# Patient Record
Sex: Male | Born: 1990 | Race: White | Hispanic: No | Marital: Married | Smoking: Current every day smoker
Health system: Southern US, Community
[De-identification: ages and names within clinical notes are randomized; demographics above are authoritative.]

## PROBLEM LIST (undated history)

## (undated) DIAGNOSIS — I951 Orthostatic hypotension: Secondary | ICD-10-CM

## (undated) DIAGNOSIS — H9190 Unspecified hearing loss, unspecified ear: Secondary | ICD-10-CM

## (undated) DIAGNOSIS — K219 Gastro-esophageal reflux disease without esophagitis: Secondary | ICD-10-CM

## (undated) DIAGNOSIS — R51 Headache: Principal | ICD-10-CM

## (undated) DIAGNOSIS — F411 Generalized anxiety disorder: Secondary | ICD-10-CM

## (undated) DIAGNOSIS — F1021 Alcohol dependence, in remission: Secondary | ICD-10-CM

## (undated) DIAGNOSIS — K6389 Other specified diseases of intestine: Secondary | ICD-10-CM

## (undated) DIAGNOSIS — R42 Dizziness and giddiness: Secondary | ICD-10-CM

## (undated) DIAGNOSIS — R519 Headache, unspecified: Secondary | ICD-10-CM

## (undated) DIAGNOSIS — J45909 Unspecified asthma, uncomplicated: Secondary | ICD-10-CM

## (undated) DIAGNOSIS — K589 Irritable bowel syndrome without diarrhea: Secondary | ICD-10-CM

## (undated) DIAGNOSIS — M79642 Pain in left hand: Secondary | ICD-10-CM

## (undated) DIAGNOSIS — M1990 Unspecified osteoarthritis, unspecified site: Secondary | ICD-10-CM

## (undated) DIAGNOSIS — T7840XA Allergy, unspecified, initial encounter: Secondary | ICD-10-CM

## (undated) DIAGNOSIS — R000 Tachycardia, unspecified: Secondary | ICD-10-CM

## (undated) DIAGNOSIS — G2581 Restless legs syndrome: Secondary | ICD-10-CM

## (undated) DIAGNOSIS — J302 Other seasonal allergic rhinitis: Secondary | ICD-10-CM

## (undated) DIAGNOSIS — M797 Fibromyalgia: Secondary | ICD-10-CM

## (undated) HISTORY — DX: Headache: R51

## (undated) HISTORY — DX: Gastro-esophageal reflux disease without esophagitis: K21.9

## (undated) HISTORY — DX: Pain in left hand: M79.642

## (undated) HISTORY — DX: Other specified diseases of intestine: K63.89

## (undated) HISTORY — PX: COLONOSCOPY: SHX174

## (undated) HISTORY — PX: INGUINAL HERNIA REPAIR: SUR1180

## (undated) HISTORY — DX: Alcohol dependence, in remission: F10.21

## (undated) HISTORY — PX: SINUS ENDO W/FUSION: SHX777

## (undated) HISTORY — DX: Irritable bowel syndrome, unspecified: K58.9

## (undated) HISTORY — DX: Tachycardia, unspecified: R00.0

## (undated) HISTORY — DX: Orthostatic hypotension: I95.1

## (undated) HISTORY — PX: ESOPHAGOGASTRODUODENOSCOPY: SHX1529

## (undated) HISTORY — DX: Headache, unspecified: R51.9

## (undated) HISTORY — DX: Unspecified osteoarthritis, unspecified site: M19.90

## (undated) HISTORY — DX: Allergy, unspecified, initial encounter: T78.40XA

## (undated) HISTORY — DX: Fibromyalgia: M79.7

## (undated) HISTORY — DX: Unspecified hearing loss, unspecified ear: H91.90

## (undated) HISTORY — DX: Generalized anxiety disorder: F41.1

## (undated) HISTORY — DX: Unspecified asthma, uncomplicated: J45.909

## (undated) HISTORY — DX: Restless legs syndrome: G25.81

## (undated) HISTORY — DX: Other seasonal allergic rhinitis: J30.2

## (undated) HISTORY — PX: WISDOM TOOTH EXTRACTION: SHX21

## (undated) HISTORY — PX: CARDIOVASCULAR STRESS TEST: SHX262

## (undated) HISTORY — DX: Dizziness and giddiness: R42

---
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
## 2008-10-11 ENCOUNTER — Encounter: Admission: RE | Admit: 2008-10-11 | Discharge: 2008-10-11 | Payer: Self-pay | Admitting: Family Medicine

## 2010-08-21 NOTE — Op Note (Signed)
[place]  Patient:    Dustin [name], Dustin [name] Visit Number: [id number] MRN: [id number]          Service Type: DSU Location: [facility] Attending Physician:  Carlos Levering Dictated by:   Hyman Bible Pendse, M.D. Proc. Date: 11/25/00 Adm. Date:  11/25/2000 Disc. Date: 11/25/2000   CC:         Juan Quam, M.D.   Operative Report  PREOPERATIVE DIAGNOSIS:  Bilateral inguinal hernia.  POSTOPERATIVE DIAGNOSIS:  Bilateral inguinal hernia.  OPERATION PERFORMED:  Repair of bilateral inguinal hernia.  SURGEON:  Prabhakar D. Levie Heritage, M.D.  ASSISTANT:  Nurse.  ANESTHESIA:  Nurse.  OPERATIVE PROCEDURE:  Under satisfactory general anesthesia with the patient in the supine position, the abdomen and groin regions were thoroughly prepped and draped in the usual manner.  A 2.5 cm long transverse incision was made in the right groin and distal skin crease.  The skin and subcutaneous tissue were incised.  The bleeders were individually clamped, cut, and electrocoagulated. The external oblique was opened. The ______ were dissected to isolate the indirect inguinal hernia sac.  This sac was isolated up to its half point, doubly suture ligated with 3-0 silk.  The excess of the sac was excised.  The testicle was returned to the right scrotal pouch.  Hernia repair was carried out by a modified Fergusons method with #35 wire interrupted sutures.  0.25% Marcaine with epinephrine was injected locally for postop analgesia.  The subcutaneous tissue was closed with 4-0 Vicryl.  The skin was closed with 4-0 Monocryl subcuticular sutures.  The patients general condition was satisfactory.  Exploration of the left groin was carried out.  The findings were consistent with a left indirect inguinal hernia.  Repair was carried out in a similar fashion.  Both incisions were dressed with Steri-Strips. Throughout the procedure, the patients vital signs remained stable.   The patient withstood the procedure well and was transferred to the recovery room in satisfactory general condition. Dictated by:   Hyman Bible Pendse, M.D. Attending Physician:  Carlos Levering DD:  11/25/00 TD:  11/27/00 Job: [id number]

## 2010-08-21 NOTE — H&P (Signed)
NAMEEWEL, LONA               ACCOUNT NO.:  [id number]   MEDICAL RECORD NO.:  [id number]          PATIENT TYPE:  OBV   LOCATION:  1517                         FACILITY:  [facility]   PHYSICIAN:  Theone Stanley, MD   DATE OF BIRTH:  1991/02/01   DATE OF ADMISSION:  02/20/2006  DATE OF DISCHARGE:                              HISTORY & PHYSICAL   CHIEF COMPLAINT:  Painful joints.   HISTORY OF PRESENT ILLNESS:  Mr. Deboer is a very pleasant 20 year old  gentleman who presents to the ER the second time this week.  His issue  started on Thursday where he started to have itchy back and hip.  Later  in the afternoon, developed urticaria along the hip, axillary and the  back of the head.  Brought to the ER on 11/16 and was told it was viral  in nature.  At that time, he did have one swollen finger but this went  away.  Yesterday morning is when he had swelling and painful joints in  the hand bilaterally, ankles bilaterally, knees bilaterally, elbows  bilaterally and toes.  The patient also noted he had a headache last  week lasting from Monday until Wednesday.  He normally does have  intermittent headaches however this one was unusual in the fact that it  was persistent despite taking Tylenol and other over-the-counter  medications.  It went it away on its own.  However, yesterday he started  to have another headache in the same nature, took ibuprofen and again it  did not go away.  The headache he described it along his forehead area.  It was constant and pressure type feeling.   PAST MEDICAL HISTORY:  None.   MEDICATIONS:  The patient was on tetracycline for acne.  He was taking  it for 2 weeks and then stopped on the day he started to have the  itching.   SURGERIES:  Bilateral inguinal hernia repair.   FAMILY HISTORY:  Significant for rheumatoid arthritis in the paternal  grandmother and aunt.  His father unfortunately passed away at the age  of 72 from esophageal cancer.   Paternal uncle died of lung cancer.  A  paternal grandfather died of lung cancer.  A maternal grandfather had  diabetes.   SOCIAL HISTORY:  The patient lives in [place] with his mom who is  widowed.  He does have a girlfriend but denies any sexual activity.  No  tobacco, alcohol or illicit drug use.   REVIEW OF SYSTEMS:  Please see HPI.  Otherwise, all systems were  negative.   PHYSICAL EXAM:  GENERAL:  Mr. Sanor is a very pleasant 20 year old  gentleman lying in the gurney in no acute distress.  VITAL SIGNS:  Temperature of 97.5, respiratory rate of 20, heart rate of  69, blood pressure 124/66.  HEENT:  Head atraumatic, normocephalic.  Eyes 4 mm, equal, reactive.  Extraocular movements intact.  Eyes and nose:  No discharge.  Throat:  Clear.  No erythema appreciated.  NECK:  Supple.  I do not appreciate any lymphadenopathy.  HEART:  Regular rate and rhythm.  No murmur, rubs or gallops heard.  LUNGS:  Clear to auscultation bilaterally.  ABDOMEN:  Soft, nontender, nondistended.  EXTREMITIES:  No edema, cyanosis or clubbing.  It was evident that he  had swelling in his hands and his knees.  He stated that his ankles and  elbows were also swollen.  On examination, it was also painful to touch.  The patient was unable to fully extend or flex his hands.  He also had  pain along the tendons but I do not appreciate any swelling along there.  SKIN:  I do not appreciate any rashes, urticaria or jaundice.  GU:  Deferred.  PSYCH:  The patient had appropriate speech and affect.   LABS/RADIOLOGY:  Rapid strep screen was negative.  ESR was 2.  Monospot  was negative.  BMP glucose is slightly elevated at 128.  CBC showed a  white count of 10, hemoglobin of 13, hematocrit of 40, MCV of 94,  platelet 282, neutrophils 87, lymphocytes 10, monocytes 3, no  eosinophils or basophils.   ASSESSMENT AND PLAN:  Polyarthritis.  This could be viral, bacterial or  serum sickness from his medication.  I  suspect this is viral in nature,  however, because of the constellation of his presentation, I will  consult Dr. Maurice March of infectious disease.  He recommended obtaining an ASO  titer.  This has been written for.  Other things to consider would be  Epstein-Barr virus, cytomegalovirus.  Although the patient does deny  sexual activity, need to consider gonorrhea and chlamydia.  If this is  firm, tetracycline would not give the patient tetracycline again.  At  this point in time, will also provide symptomatic relief with  nonsteroidal anti-inflammatory drugs, Toradol, Benadryl and Tylenol as  needed.      Theone Stanley, MD  Electronically Signed     AEJ/MEDQ  D:  02/20/2006  T:  02/20/2006  Job:  [id number]   cc:   Dr. Tiburcio Pea

## 2010-08-21 NOTE — Discharge Summary (Signed)
Dustin [name], Dustin [name]               ACCOUNT NO.:  [id number]   MEDICAL RECORD NO.:  [id number]          PATIENT TYPE:  OBV   LOCATION:  1517                         FACILITY:  [facility]   PHYSICIAN:  Theone Stanley, MD   DATE OF BIRTH:  22-Jul-1990   DATE OF ADMISSION:  02/20/2006  DATE OF DISCHARGE:  02/21/2006                               DISCHARGE SUMMARY   ADMISSION DIAGNOSIS:  Polyarthritis.   DISCHARGE DIAGNOSIS:  Polyarthritis, most likely viral in nature.   CONSULTATIONS:  Infectious disease.   PROCEDURES/DIAGNOSTIC TESTS:  Laboratory work:  Patient had an ASO titer  which was less than 25.  RPR, which was negative.  Rapid strep, which is  negative.  RA, which was less than 20.  ESR, which was 2.  Mono spot,  which was negative.  A BNP shows a sodium of 141, potassium 4.4,  chloride 108, CO2 27, BUN 12, creatinine 0.27.  A CBC showed a white  count of 10, hemoglobin 13, hematocrit 40.   HOSPITAL COURSE:  Dustin [name] is a very pleasant 20 year old gentleman  who presents to the hospital for the second time for the same complaints  of polyarthritis.  He originally had problems starting on Thursday of  last week, where he had pain in the back and hip, and later in the  afternoon he had some urticaria along the hip, axillary, and the back of  his head.  He did take some Benadryl, which resolved the urticaria;  however, he did have swelling of one finger.  He was brought to the ER  at that time.  He was told it was viral in nature; however, yesterday,  on the 18th, he subsequently had swelling and pain in bilateral hands,  knees, ankles, and toes.  Because of this, his mother brought him to the  hospital, concerned.  He also complained of some headache.  Because of  the nature of his symptoms, he was brought in for symptom relief and  further workup.   Laboratory was obtained, please see above.   Infectious disease was kind enough to see the patient.   The following tests are  pending at this point in time:  Acute hepatitis  A, B, C panel, EBV cytomegaly virus, Parvovirus 19B, ECR for Parvovirus  19B.   Patient was started on Toradol.  His pain and inflammation improved  dramatically.  Since his symptoms improved, it was felt, with the okay  of ID, to discharge him with Motrin 800 mg t.i.d.  Will need to follow  up with Dr. Tiburcio Pea to  see what laboratory results show.  Patient was discharged in stable  condition to home.  Follow up with Dr. Tiburcio Pea in 5-7 days.   DISCHARGE MEDICATIONS:  Motrin 800 mg 1 p.o. t.i.d. for 7-10 days.      Theone Stanley, MD  Electronically Signed     AEJ/MEDQ  D:  02/21/2006  T:  02/21/2006  Job:  [id number]   cc:   Fransisco Hertz, M.D.  Fax: [phone]

## 2013-05-23 ENCOUNTER — Other Ambulatory Visit: Payer: Self-pay | Admitting: Allergy and Immunology

## 2013-05-23 ENCOUNTER — Ambulatory Visit
Admission: RE | Admit: 2013-05-23 | Discharge: 2013-05-23 | Disposition: A | Discharge status: Home / Self Care | Source: Ambulatory Visit | Attending: Allergy and Immunology | Admitting: Allergy and Immunology

## 2013-05-23 DIAGNOSIS — R05 Cough: Secondary | ICD-10-CM

## 2013-05-23 DIAGNOSIS — R059 Cough, unspecified: Secondary | ICD-10-CM

## 2014-01-31 ENCOUNTER — Other Ambulatory Visit: Payer: Self-pay | Admitting: Family Medicine

## 2014-01-31 DIAGNOSIS — R42 Dizziness and giddiness: Secondary | ICD-10-CM

## 2014-02-05 ENCOUNTER — Ambulatory Visit
Admission: RE | Admit: 2014-02-05 | Discharge: 2014-02-05 | Disposition: A | Discharge status: Home / Self Care | Source: Ambulatory Visit | Attending: Family Medicine | Admitting: Family Medicine

## 2014-02-05 DIAGNOSIS — R42 Dizziness and giddiness: Secondary | ICD-10-CM

## 2014-02-05 MED ORDER — GADOBENATE DIMEGLUMINE 529 MG/ML IV SOLN: 16.0000 mL | Freq: Once | INTRAVENOUS | Status: AC | PRN

## 2014-03-18 ENCOUNTER — Ambulatory Visit: Admitting: Neurology

## 2014-03-18 ENCOUNTER — Telehealth: Payer: Self-pay | Admitting: Neurology

## 2014-03-18 NOTE — Telephone Encounter (Signed)
Pt came to the office at 2:15PM thinking that his appt was at 2:30PM. I informed the pt that his appt was at 9:30AM instead. Pt will call later to r/s the appt. Dr. Reade/ referring provider was notified.

## 2014-03-19 NOTE — Telephone Encounter (Signed)
See previous documentation, appt marked as a no show but a no show letter not sent. We will await a CB from the pt to r/s / Sherri S.

## 2014-05-14 ENCOUNTER — Encounter: Payer: Self-pay | Admitting: Neurology

## 2014-05-14 ENCOUNTER — Ambulatory Visit (INDEPENDENT_AMBULATORY_CARE_PROVIDER_SITE_OTHER): Admitting: Neurology

## 2014-05-14 VITALS — BP 140/60 | HR 62 | Temp 97.6°F | Resp 16 | Ht 71.0 in | Wt 178.5 lb

## 2014-05-14 DIAGNOSIS — R42 Dizziness and giddiness: Secondary | ICD-10-CM

## 2014-05-14 DIAGNOSIS — H8190 Unspecified disorder of vestibular function, unspecified ear: Secondary | ICD-10-CM

## 2014-05-14 DIAGNOSIS — H8120 Vestibular neuronitis, unspecified ear: Secondary | ICD-10-CM

## 2014-05-14 MED ORDER — TOPIRAMATE 25 MG PO TABS: 25.0000 mg | ORAL_TABLET | Freq: Every day | ORAL | Status: DC

## 2014-05-14 NOTE — Progress Notes (Signed)
NEUROLOGY CONSULTATION NOTE  EHAB HUMBER MRN: [id number] DOB: [birth year]  Referring provider: Dr. Alyson Ingles Primary care provider: Dr. Moreen Fowler  Reason for consult:  vertigo  HISTORY OF PRESENT ILLNESS: Dustin [name] is a 24 year old right-handed man with asthma who presents for dizziness.  Records and MRI of the brain reviewed.  He is accompanied by his girlfriend who provides some history.  He began experiencing dizziness following sinus surgery in September.  He describes the dizziness as a spinning sensation to the right.  It occurs spontaneously and not triggered by any movement or position.  It can occur while driving.  It lasts anywhere from 5 minutes but has lasted constantly for 2 days.  It usually occurs several times a day.  It is not associated with headache, nausea, dizziness or double vision.  On one occasion, it occurred while he was running and he felt like he was going to pass out.  Sometimes, he notes a pop in his ear, followed by ringing which can last a few seconds.  He denies hearing loss, however.  He tried meclizine, which did not help.  He had an MRI of the brain with and without contrast was performed on 02/05/14, which was unremarkable.  He also saw ENT.  Audiometric testing revealed only mild sensorineural hearing loss.  Vestibular migraine was suspected.  He reports that he has occasional headaches, described as bi-frontal and throbbing.  They last 45 minutes and occur 5 or 6 times a year.  It may be associated with phonophobia but not nausea, photophobia or visual disturbance.  PAST MEDICAL HISTORY: Past Medical History  Diagnosis Date  . Hearing loss     PAST SURGICAL HISTORY: Past Surgical History  Procedure Laterality Date  . Sinus endo w/fusion      MEDICATIONS: No current outpatient prescriptions on file prior to visit.   No current facility-administered medications on file prior to visit.    ALLERGIES: Allergies  Allergen Reactions  .  Penicillins Hives  . Tetracyclines & Related Hives    FAMILY HISTORY: Family History  Problem Relation Age of Onset  . Cancer Father     throat   . Cancer Paternal Grandfather     SOCIAL HISTORY: History   Social History  . Marital Status: Married    Spouse Name: N/A    Number of Children: N/A  . Years of Education: N/A   Occupational History  . Not on file.   Social History Main Topics  . Smoking status: Former Research scientist (life sciences)  . Smokeless tobacco: Never Used  . Alcohol Use: 0.0 oz/week    0 Not specified per week  . Drug Use: No  . Sexual Activity:    Partners: Female   Other Topics Concern  . Not on file   Social History Narrative  . No narrative on file    REVIEW OF SYSTEMS: Constitutional: No fevers, chills, or sweats, no generalized fatigue, change in appetite Eyes: No visual changes, double vision, eye pain Ear, nose and throat: No hearing loss, ear pain, nasal congestion, sore throat Cardiovascular: No chest pain, palpitations Respiratory:  No shortness of breath at rest or with exertion, wheezes GastrointestinaI: No nausea, vomiting, diarrhea, abdominal pain, fecal incontinence Genitourinary:  No dysuria, urinary retention or frequency Musculoskeletal:  No neck pain, back pain Integumentary: No rash, pruritus, skin lesions Neurological: as above Psychiatric: No depression, insomnia, anxiety Endocrine: No palpitations, fatigue, diaphoresis, mood swings, change in appetite, change in weight, increased thirst Hematologic/Lymphatic:  No  anemia, purpura, petechiae. Allergic/Immunologic: no itchy/runny eyes, nasal congestion, recent allergic reactions, rashes  PHYSICAL EXAM: Filed Vitals:   05/14/14 1106  BP: 140/60  Pulse: 62  Temp: 97.6 F (36.4 C)  Resp: 16   General: No acute distress Head:  Normocephalic/atraumatic Eyes:  fundi unremarkable, without vessel changes, exudates, hemorrhages or papilledema. Neck: supple, no paraspinal tenderness, full  range of motion Back: No paraspinal tenderness Heart: regular rate and rhythm Lungs: Clear to auscultation bilaterally. Vascular: No carotid bruits. Neurological Exam: Mental status: alert and oriented to person, place, and time, recent and remote memory intact, fund of knowledge intact, attention and concentration intact, speech fluent and not dysarthric, language intact. Cranial nerves: CN I: not tested CN II: pupils equal, round and reactive to light, visual fields intact, fundi unremarkable, without vessel changes, exudates, hemorrhages or papilledema. CN III, IV, VI:  full range of motion, no nystagmus, no ptosis CN V: facial sensation intact CN VII: upper and lower face symmetric CN VIII: hearing intact CN IX, X: gag intact, uvula midline CN XI: sternocleidomastoid and trapezius muscles intact CN XII: tongue midline Bulk & Tone: normal, no fasciculations. Motor:  5/5 throughout Sensation:  Temperature and vibration intact Deep Tendon Reflexes:  2+ throughout, toes downgoing Finger to nose testing:  No dysmetria Heel to shin:  No dysmetria Gait:  Normal station and stride.  Able to turn and walk in tandem. Romberg negative.  IMPRESSION: Recurrent episodic vertigo.  Etiology is unclear, but vestibular migraine cannot be completely ruled out.  PLAN: 1.  I would treat for possible migraine.  We discussed the various preventative medications and he has decided on topamax.  He will start 25mg  at bedtime and will call in 4 weeks with update.   2.  Unfortunately, in the absent of headache, typical abortive migraine medications are not indicated.   3.  Follow up in 3 months  Thank you for allowing me to take part in the care of this patient.  Metta Clines, DO  CC:  Maury Dus, MD  Antony Contras, MD

## 2014-05-14 NOTE — Patient Instructions (Signed)
I can't say for sure that the vertigo is migraine-related, however it cannot be ruled out.  Therefore, I would treat it as migraine and see how you do. 1.  We will start topiramate (Topamax) 25mg  at bedtime.  Possible side effects include: impaired thinking, sedation, paresthesias (numbness and tingling) and weight loss.  It may cause dehydration and there is a small risk for kidney stones, so make sure to stay hydrated with water during the day.  There is also a very small risk for glaucoma, so if you notice any change in your vision while taking this medication, see an ophthalmologist.    2.  Keep a diary and call in 4 weeks with update.  We can adjust dose or medication if needed. 3.  Follow up in 3 months.

## 2014-08-12 ENCOUNTER — Ambulatory Visit: Admitting: Neurology

## 2014-08-12 ENCOUNTER — Ambulatory Visit (INDEPENDENT_AMBULATORY_CARE_PROVIDER_SITE_OTHER): Admitting: Neurology

## 2014-08-12 ENCOUNTER — Encounter: Payer: Self-pay | Admitting: Neurology

## 2014-08-12 VITALS — BP 110/80 | HR 74 | Resp 20 | Ht 71.0 in | Wt 171.2 lb

## 2014-08-12 DIAGNOSIS — H8121 Vestibular neuronitis, right ear: Secondary | ICD-10-CM | POA: Diagnosis not present

## 2014-08-12 DIAGNOSIS — R42 Dizziness and giddiness: Secondary | ICD-10-CM | POA: Insufficient documentation

## 2014-08-12 DIAGNOSIS — H8190 Unspecified disorder of vestibular function, unspecified ear: Secondary | ICD-10-CM | POA: Insufficient documentation

## 2014-08-12 MED ORDER — TOPIRAMATE 50 MG PO TABS: 50.0000 mg | ORAL_TABLET | Freq: Every day | ORAL | Status: DC

## 2014-08-12 NOTE — Progress Notes (Signed)
NEUROLOGY FOLLOW UP OFFICE NOTE  Dustin [name] [id number]  HISTORY OF PRESENT ILLNESS: Dustin [name] is a 24 year old right-handed man with asthma who follows up for recurrent episodic vertigo.  UPDATE: He was started on topiramate 25mg  daily.  He has not really noticed much difference.  He is in the [employer] and it has effected is PT, as exertion triggers or exacerbates symptoms.  HISTORY: He began experiencing dizziness following sinus surgery in September.  He describes the dizziness as a spinning sensation to the right.  It occurs spontaneously and not triggered by any movement or position.  It can occur while driving.  It lasts anywhere from 5 minutes but has lasted constantly for 2 days.  It usually occurs several times a day.  It is not associated with headache, nausea, dizziness or double vision.  On one occasion, it occurred while he was running and he felt like he was going to pass out.  Sometimes, he notes a pop in his ear, followed by ringing which can last a few seconds. He denies hearing loss, however.  He tried meclizine, which did not help.  He had an MRI of the brain with and without contrast was performed on 02/05/14, which was unremarkable.  He also saw ENT.  Audiometric testing revealed only mild sensorineural hearing loss.  Vestibular migraine was suspected.  He reports that he has occasional headaches, described as bi-frontal and throbbing.  They last 45 minutes and occur 5 or 6 times a year.  It may be associated with phonophobia but not nausea, photophobia or visual disturbance.  PAST MEDICAL HISTORY: Past Medical History  Diagnosis Date  . Hearing loss   . Headache     MEDICATIONS: Current Outpatient Prescriptions on File Prior to Visit  Medication Sig Dispense Refill  . loratadine (CLARITIN) 10 MG tablet Take 10 mg by mouth daily.    Marland Kitchen topiramate (TOPAMAX) 25 MG tablet Take 1 tablet (25 mg total) by mouth at bedtime. 30 tablet 1   No current  facility-administered medications on file prior to visit.    ALLERGIES: Allergies  Allergen Reactions  . Penicillins Hives  . Tetracyclines & Related Hives    FAMILY HISTORY: Family History  Problem Relation Age of Onset  . Cancer Father     throat   . Cancer Paternal Grandfather     SOCIAL HISTORY: History   Social History  . Marital Status: Married    Spouse Name: N/A  . Number of Children: N/A  . Years of Education: N/A   Occupational History  . Not on file.   Social History Main Topics  . Smoking status: Former Research scientist (life sciences)  . Smokeless tobacco: Never Used  . Alcohol Use: 0.0 oz/week    0 Standard drinks or equivalent per week     Comment: social  . Drug Use: No  . Sexual Activity:    Partners: Female   Other Topics Concern  . Not on file   Social History Narrative    REVIEW OF SYSTEMS: Constitutional: No fevers, chills, or sweats, no generalized fatigue, change in appetite Eyes: No visual changes, double vision, eye pain Ear, nose and throat: No hearing loss, ear pain, nasal congestion, sore throat Cardiovascular: No chest pain, palpitations Respiratory:  No shortness of breath at rest or with exertion, wheezes GastrointestinaI: No nausea, vomiting, diarrhea, abdominal pain, fecal incontinence Genitourinary:  No dysuria, urinary retention or frequency Musculoskeletal:  No neck pain, back pain Integumentary: No rash, pruritus, skin lesions  Neurological: as above Psychiatric: No depression, insomnia, anxiety Endocrine: No palpitations, fatigue, diaphoresis, mood swings, change in appetite, change in weight, increased thirst Hematologic/Lymphatic:  No anemia, purpura, petechiae. Allergic/Immunologic: no itchy/runny eyes, nasal congestion, recent allergic reactions, rashes  PHYSICAL EXAM: Filed Vitals:   08/12/14 0940  BP: 110/80  Pulse: 74  Resp: 20   General: No acute distress Head:  Normocephalic/atraumatic Eyes:  Fundoscopic exam unremarkable  without vessel changes, exudates, hemorrhages or papilledema. Neck: supple, no paraspinal tenderness, full range of motion Heart:  Regular rate and rhythm Lungs:  Clear to auscultation bilaterally Back: No paraspinal tenderness Neurological Exam: alert and oriented to person, place, and time. Attention span and concentration intact, recent and remote memory intact, fund of knowledge intact.  Speech fluent and not dysarthric, language intact.  CN II-XII intact. Fundoscopic exam unremarkable without vessel changes, exudates, hemorrhages or papilledema.  Bulk and tone normal, muscle strength 5/5 throughout.  Sensation to light touch, temperature and vibration intact.  Deep tendon reflexes 2+ throughout, toes downgoing.  Finger to nose and heel to shin testing intact.  Gait normal  IMPRESSION: Recurrent episodic vertigo.  Etiology unknown.  Vestibular migraine possible but it is a diagnosis of exclusion since he has no history of migraine, no substantial migrainous symptoms associated with vertigo and he has only used a small dose of topiramate.  PLAN: Increase topiramate to 50mg  at bedtime.  HE WAS INSTRUCTED TO CALL IN 4 WEEKS WITH UPDATE AND WE CAN FURTHER INCREASE DOSE IF NEEDED. Will fill out form for the [employer] restricting activity such as running, sit-ups and push-ups Follow up in 3 months  15 minutes spent with patient, over 50% spent discussing diagnosis and plan.  Metta Clines, DO  CC:  Antony Contras, MD

## 2014-08-15 ENCOUNTER — Ambulatory Visit: Admitting: Neurology

## 2014-08-22 ENCOUNTER — Telehealth: Payer: Self-pay | Admitting: *Deleted

## 2014-08-22 NOTE — Telephone Encounter (Signed)
The numbness and tingling is likely related to the topamax.  I would not know of any other reason that he would be having numbness.  I would give it until Monday to see how he is.

## 2014-08-22 NOTE — Telephone Encounter (Signed)
Patient's wife called and stated patient is taking tompamax 50 mg HS  Saturday night he took it and when he woke up Sunday his hands felt numb and tingling she states he has not taken it since .but is still having the tingling Please advise

## 2014-08-22 NOTE — Telephone Encounter (Signed)
I spoke with patient spouse patient will stop Topamax until Monday and was advised to contact office then and let us know how he is doing .

## 2014-09-10 ENCOUNTER — Ambulatory Visit (INDEPENDENT_AMBULATORY_CARE_PROVIDER_SITE_OTHER): Admitting: Neurology

## 2014-09-10 ENCOUNTER — Encounter: Payer: Self-pay | Admitting: Neurology

## 2014-09-10 VITALS — BP 104/60 | HR 66 | Resp 20 | Ht 71.26 in | Wt 168.0 lb

## 2014-09-10 DIAGNOSIS — H8143 Vertigo of central origin, bilateral: Secondary | ICD-10-CM | POA: Diagnosis not present

## 2014-09-10 DIAGNOSIS — H814 Vertigo of central origin: Secondary | ICD-10-CM | POA: Insufficient documentation

## 2014-09-10 DIAGNOSIS — R42 Dizziness and giddiness: Secondary | ICD-10-CM | POA: Diagnosis not present

## 2014-09-10 DIAGNOSIS — IMO0001 Reserved for inherently not codable concepts without codable children: Secondary | ICD-10-CM

## 2014-09-10 MED ORDER — DIAZEPAM 2 MG PO TABS: ORAL_TABLET | ORAL | Status: DC

## 2014-09-10 NOTE — Progress Notes (Signed)
Provider:  Larey Seat, M D  Referring Provider: Antony Contras, MD Primary Care Physician:  Gara Kroner, MD  Chief Complaint  Patient presents with  . Dizziness    rm 11, new patient, with wife    HPI:  Dustin [name] is a 24 y.o. male, seen here as a referral from Dr. Moreen Fowler for vertigo evaluation.   Dustin [name] is a 24 year old Caucasian tended male who presents for an evaluation of vertigo.  His wife are here together today reporting that Dustin [name] underwent a sinus surgery in September 2015 within days after the sinus surgery his sinus problems resolved but he begun experiencing vertigo. An MRI was obtained the months following to surgery which showed no acute brain abnormalities it was done with and without contrast. There was still some sinus mucosal thickening noted. He also has a history of asthma and is taking antiallergy medicines and nasal spray. He describes his dizziness as a spinning sensation to the right felt that his surrounding spun around him and not that he was in motion spells occurred without any noticeable trigger. At times he would just watch TV or drive and was not aware of a rapid head movement or positional change bring it on. Some spells lasted a couple of minutes for other more severe one lasted a couple of hours. He usually has a spell once or several times a day he reports that he has never been woken out of sleep by a vertigo spell or that's his sleep is affected in any way by this. When he wakes up in the morning he does not wake up first feeling dizzy. He has some headaches but these seem to be unrelated to the vertigo spells. He did have some nausea with some of the vertigo spells but no diplopia he feels dizzy and lightheaded at times almost as if passing out but has never so far lost awareness or consciousness. 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  He had tried meclizine and his primary care first which did not help he had seen Dr. Loretta Plume, D.O. Who  prescribed topiramate concerning that this may be a migraine equivalent. This did not work.   Again the MRI was unremarkable in November 2015 ENT testing showed hearing loss and the possibility of a retrocochlear origin of vertigo needs to be evaluated.  Review of Systems: Out of a complete 14 system review, the patient complains of only the following symptoms, and all other reviewed systems are negative. See above .  Tried topiramate for several months but stated that he had tingling around fingers and lips an expected side effect. However since it didn't change his nausea pattern or his vertigo pattern he discontinued the medication with approval of his prescribing physician. He has Restless legs, his mother has this , too.  History   Social History  . Marital Status: Married    Spouse Name: N/A  . Number of Children: N/A  . Years of Education: N/A   Occupational History  . Not on file.   Social History Main Topics  . Smoking status: Former Smoker    Quit date: 04/05/2010  . Smokeless tobacco: Never Used  . Alcohol Use: 0.0 oz/week    0 Standard drinks or equivalent per week     Comment: social  . Drug Use: No  . Sexual Activity:  Partners: Female   Other Topics Concern  . Not on file   Social History Narrative   Denies caffeine consumption.    Family History  Problem Relation Age of Onset  . Cancer Father     throat   . Cancer Paternal Grandfather     Past Medical History  Diagnosis Date  . Hearing loss   . Headache   . Asthma   . Vertigo     Past Surgical History  Procedure Laterality Date  . Sinus endo w/fusion      Current Outpatient Prescriptions  Medication Sig Dispense Refill  . albuterol (PROVENTIL HFA;VENTOLIN HFA) 108 (90 BASE) MCG/ACT inhaler Inhale 1 puff into the lungs every 6 (six) hours as needed for wheezing or shortness of  breath.    . fluticasone (FLONASE) 50 MCG/ACT nasal spray Place 1 spray into both nostrils daily.    Marland Kitchen loratadine (CLARITIN) 10 MG tablet Take 10 mg by mouth daily.    . meclizine (ANTIVERT) 12.5 MG tablet     . PREDNISONE, PAK, PO Take by mouth.    . topiramate (TOPAMAX) 50 MG tablet Take 1 tablet (50 mg total) by mouth at bedtime. (Patient not taking: Reported on 09/10/2014) 30 tablet 2   No current facility-administered medications for this visit.    Allergies as of 09/10/2014 - Review Complete 09/10/2014  Allergen Reaction Noted  . Penicillins Hives 05/14/2014  . Tetracyclines & related Hives 05/14/2014  . Biaxin [clarithromycin]  09/10/2014  . Ceftin [cefuroxime axetil]  09/10/2014    Vitals: BP 104/60 mmHg  Pulse 66  Resp 20  Ht 5' 11.26" (1.81 m)  Wt 168 lb (76.204 kg)  BMI 23.26 kg/m2 Last Weight:  Wt Readings from Last 1 Encounters:  09/10/14 168 lb (76.204 kg)   Last Height:   Ht Readings from Last 1 Encounters:  09/10/14 5' 11.26" (1.81 m)    Physical exam:  General: The patient is awake, alert and appears not in acute distress. The patient is well groomed. Head: Normocephalic, atraumatic. Neck is supple. Mallampati 3, neck circumference: 14  Cardiovascular:  Regular rate and rhythm , without  murmurs or carotid bruit, and without distended neck veins. Respiratory: Lungs are clear to auscultation. Skin:  Without evidence of edema, or rash Trunk: BMI is normal .  Neurologic exam : The patient is awake and alert, oriented to place and time.  Memory subjective described as intact. There is a normal attention span & concentration ability. Speech is fluent without  dysarthria, dysphonia or aphasia. Mood and affect are appropriate.  Cranial nerves: Pupils are equal and briskly reactive to light. Funduscopic exam without  evidence of pallor or edema. Extraocular movements  in vertical and horizontal planes intact and without nystagmus.  Visual fields by finger  perimetry are intact. Hearing to finger rub intact.  Facial sensation intact to fine touch. Facial motor strength is symmetric and tongue and uvula move midline. Tongue protrusion into either cheek is normal. Shoulder shrug is normal.   Motor exam:  Normal tone ,muscle bulk and symmetric  strength in all extremities.  Sensory:  Fine touch, pinprick and vibration were tested in all extremities. Proprioception was normal.  Coordination: Rapid alternating movements in the fingers/hands were normal. Finger-to-nose maneuver  normal without evidence of ataxia, dysmetria or tremor.  Gait and station: Patient walks without assistive device and is able unassisted to climb up to the exam table. Strength within normal limits. Stance is stable and normal. Tandem gait is  unfragmented. Romberg testing is negative . His righting reflexes are intact.   Deep tendon reflexes: in the  upper and lower extremities are symmetric and intact. Babinski maneuver response is downgoing.   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 Exhaustion and exercise can be a trigger. 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   He had spells while exercising , playing basketball with his brother .  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    Plan:  Treatment plan and additional workup :  # 3 month of Valium  0.5 mg in AM, to be repeated as needed in daytime.  Re evaluation after 3 month- he will adjust to the new normal.  He'll continue with his anti-histamines, I will write a memorandum for primary care managers medical evaluation to the medical liaison officer for the [employer] reserve.      Asencion Partridge  Dohmeier MD 09/10/2014

## 2014-09-10 NOTE — Patient Instructions (Signed)
Vertigo Vertigo means you feel like you are moving when you are not. Vertigo can make you feel like things around you are moving when they are not. This problem often goes away on its own.  HOME CARE   Follow your doctor's instructions.  Avoid driving.  Avoid using heavy machinery.  Avoid doing any activity that could be dangerous if you have a vertigo attack.  Tell your doctor if a medicine seems to cause your vertigo. GET HELP RIGHT AWAY IF:   Your medicines do not help or make you feel worse.  You have trouble talking or walking.  You feel weak or have trouble using your arms, hands, or legs.  You have bad headaches.  You keep feeling sick to your stomach (nauseous) or throwing up (vomiting).  Your vision changes.  A family member notices changes in your behavior.  Your problems get worse. MAKE SURE YOU:  Understand these instructions.  Will watch your condition.  Will get help right away if you are not doing well or get worse. Document Released: 12/30/2007 Document Revised: 06/14/2011 Document Reviewed: 10/08/2010 ExitCare Patient Information 2015 [employer]. This information is not intended to replace advice given to you by your health care provider. Make sure you discuss any questions you have with your health care provider.  

## 2014-09-10 NOTE — Addendum Note (Signed)
Addended by: Larey Seat on: 09/10/2014 10:07 AM   Modules accepted: Orders

## 2014-09-12 ENCOUNTER — Telehealth: Payer: Self-pay

## 2014-09-12 NOTE — Telephone Encounter (Signed)
Patient called to inform of [place] appointment 09/17/2014 @1  at [facility].  Patient verbalized understanding.

## 2014-09-16 ENCOUNTER — Other Ambulatory Visit (HOSPITAL_COMMUNITY)

## 2014-09-17 ENCOUNTER — Other Ambulatory Visit (HOSPITAL_COMMUNITY)

## 2014-09-24 ENCOUNTER — Ambulatory Visit (HOSPITAL_COMMUNITY)
Admission: RE | Admit: 2014-09-24 | Discharge: 2014-09-24 | Disposition: A | Discharge status: Home / Self Care | Source: Ambulatory Visit | Attending: Neurology | Admitting: Neurology

## 2014-09-24 DIAGNOSIS — H8143 Vertigo of central origin, bilateral: Secondary | ICD-10-CM | POA: Diagnosis not present

## 2014-09-24 DIAGNOSIS — R42 Dizziness and giddiness: Secondary | ICD-10-CM | POA: Diagnosis present

## 2014-09-24 DIAGNOSIS — IMO0001 Reserved for inherently not codable concepts without codable children: Secondary | ICD-10-CM

## 2014-09-24 NOTE — Progress Notes (Signed)
VASCULAR LAB PRELIMINARY  PRELIMINARY  PRELIMINARY  PRELIMINARY  TCD bubble study completed.    Preliminary report:  No HITS noted.  Negative bubble study.  Cestone,Helene, RVT 09/24/2014, 2:36 PM

## 2014-09-30 ENCOUNTER — Telehealth: Payer: Self-pay

## 2014-09-30 NOTE — Telephone Encounter (Signed)
Called pt to tell him that his TCD was normal. Left message explaining this, per DPR. Asked pt to call me back if he had any questions.

## 2014-09-30 NOTE — Telephone Encounter (Signed)
-----   Message from Larey Seat, MD sent at 09/27/2014  1:51 PM EDT ----- Vertigo work up patient , normal TCD emboly study- no open PFO in the heart. Has the  Dynamic study been done with dr Lenell Antu [place] tech yet ?

## 2014-10-24 ENCOUNTER — Ambulatory Visit (INDEPENDENT_AMBULATORY_CARE_PROVIDER_SITE_OTHER): Admitting: Neurology

## 2014-10-24 ENCOUNTER — Encounter: Payer: Self-pay | Admitting: Neurology

## 2014-10-24 VITALS — BP 104/60 | HR 72 | Resp 20 | Ht 71.0 in | Wt 166.0 lb

## 2014-10-24 DIAGNOSIS — H81319 Aural vertigo, unspecified ear: Secondary | ICD-10-CM

## 2014-10-24 DIAGNOSIS — R42 Dizziness and giddiness: Secondary | ICD-10-CM

## 2014-10-24 MED ORDER — DIAZEPAM 2 MG PO TABS: ORAL_TABLET | ORAL | Status: DC

## 2014-10-24 NOTE — Progress Notes (Signed)
Provider:  Larey Seat, M D  Referring Provider: Antony Contras, MD Primary Care Physician:  Gara Kroner, MD  Chief Complaint  Patient presents with  . Follow-up    dizziness, rm 10, with wife    HPI:  Dustin [name] is a 24 y.o. male, seen here as a referral from Dr. Moreen Fowler for vertigo evaluation.   Dustin [name] is a 24 year old Caucasian tended male who presents for an evaluation of vertigo.  His wife are here together today reporting that Dustin [name] underwent a sinus surgery in September 2015 within days after the sinus surgery his sinus problems resolved but he begun experiencing vertigo. An MRI was obtained the months following to surgery which showed no acute brain abnormalities it was done with and without contrast. There was still some sinus mucosal thickening noted. He also has a history of asthma and is taking antiallergy medicines and nasal spray. He describes his dizziness as a spinning sensation to the right felt that his surrounding spun around him and not that he was in motion spells occurred without any noticeable trigger. At times he would just watch TV or drive and was not aware of a rapid head movement or positional change bring it on. Some spells lasted a couple of minutes for other more severe one lasted a couple of hours. He usually has a spell once or several times a day he reports that he has never been woken out of sleep by a vertigo spell or that's his sleep is affected in any way by this. When he wakes up in the morning he does not wake up first feeling dizzy. He has some headaches but these seem to be unrelated to the vertigo spells. He did have some nausea with some of the vertigo spells but no diplopia he feels dizzy and lightheaded at times almost as if passing out but has never so far lost awareness or consciousness. He was tested for hearing loss with a local audiology laboratory and was found to have indeed have some hearing loss probably occupational in  nature.  He had tried meclizine and his primary care first which did not help he had seen Dr. Loretta Plume, D.O. Who  prescribed topiramate concerning that this may be a migraine equivalent. This did not work.   Again the MRI was unremarkable in November 2015 ENT testing showed hearing loss and the possibility of a retrocochlear origin of vertigo needs to be evaluated.  Review of Systems: Out of a complete 14 system review, the patient complains of only the following symptoms, and all other reviewed systems are negative. See above .  Tried topiramate for several months but stated that he had tingling around fingers and lips an expected side effect. However since it didn't change his nausea pattern or his vertigo pattern he discontinued the medication with approval of his prescribing physician. He has Restless legs, his mother has this , too.  History   Social History  . Marital Status: Married    Spouse Name: N/A  . Number of Children: N/A  . Years of Education: N/A   Occupational History  . Not on file.   Social History Main Topics  . Smoking status: Former Smoker    Quit date: 04/05/2010  . Smokeless tobacco: Never Used  . Alcohol Use: 0.0 oz/week    0 Standard drinks or equivalent per week     Comment: social  . Drug Use: No  . Sexual Activity:  Partners: Female   Other Topics Concern  . Not on file   Social History Narrative   Denies caffeine consumption.    Family History  Problem Relation Age of Onset  . Cancer Father     throat   . Cancer Paternal Grandfather     Past Medical History  Diagnosis Date  . Hearing loss   . Headache   . Asthma   . Vertigo     Past Surgical History  Procedure Laterality Date  . Sinus endo w/fusion      Current Outpatient Prescriptions  Medication Sig Dispense Refill  . albuterol (PROVENTIL HFA;VENTOLIN HFA) 108 (90 BASE) MCG/ACT inhaler Inhale 1 puff into the lungs every 6 (six) hours as needed for wheezing or shortness of  breath.    . diazepam (VALIUM) 2 MG tablet Take 1/4 tab in AM and as needed in Pm. 30 tablet 0  . fluticasone (FLONASE) 50 MCG/ACT nasal spray Place 1 spray into both nostrils daily.    Marland Kitchen loratadine (CLARITIN) 10 MG tablet Take 10 mg by mouth daily.    . meclizine (ANTIVERT) 12.5 MG tablet     . PREDNISONE, PAK, PO Take by mouth.    . topiramate (TOPAMAX) 50 MG tablet Take 1 tablet (50 mg total) by mouth at bedtime. 30 tablet 2   No current facility-administered medications for this visit.    Allergies as of 10/24/2014 - Review Complete 10/24/2014  Allergen Reaction Noted  . Penicillins Hives 05/14/2014  . Tetracyclines & related Hives 05/14/2014  . Biaxin [clarithromycin]  09/10/2014  . Ceftin [cefuroxime axetil]  09/10/2014    Vitals: BP 104/60 mmHg  Pulse 72  Resp 20  Ht 5\' 11"  (1.803 m)  Wt 166 lb (75.297 kg)  BMI 23.16 kg/m2 Last Weight:  Wt Readings from Last 1 Encounters:  10/24/14 166 lb (75.297 kg)   Last Height:   Ht Readings from Last 1 Encounters:  10/24/14 5\' 11"  (1.803 m)    Physical exam:  General: The patient is awake, alert and appears not in acute distress. The patient is well groomed. Head: Normocephalic, atraumatic. Neck is supple. Mallampati 3, neck circumference: 14  Cardiovascular:  Regular rate and rhythm , without  murmurs or carotid bruit, and without distended neck veins. Respiratory: Lungs are clear to auscultation. Skin:  Without evidence of edema, or rash Trunk: BMI is normal .  Neurologic exam : The patient is awake and alert, oriented to place and time.  Memory subjective described as intact. There is a normal attention span & concentration ability. Speech is fluent without  dysarthria, but there is mild dysphonia. Mood and affect are appropriate.  Cranial nerves: Pupils are equal and briskly reactive to light. Funduscopic exam without  evidence of pallor or edema. Extraocular movements  in vertical and horizontal planes intact and  without nystagmus.  Visual fields by finger perimetry are intact. Hearing to finger rub intact.   Facial sensation intact to fine touch. Facial motor strength is symmetric and tongue and uvula move midline.  Tongue protrusion into either cheek is normal. Shoulder shrug is normal.   Motor exam:  Normal tone ,muscle bulk and symmetric  strength in all extremities.  Sensory:  Fine touch, pinprick and vibration were tested in all extremities. Proprioception was normal.  Coordination: Rapid alternating movements in the fingers/hands were normal. Finger-to-nose maneuver  normal without evidence of ataxia, dysmetria or tremor.  Gait and station: Patient walks without assistive device and is able unassisted to  climb up to the exam table. Strength within normal limits. Stance is stable and normal. Tandem gait is unfragmented. Romberg testing is negative . His righting reflexes are intact.   Deep tendon reflexes: in the  upper and lower extremities are symmetric and intact. Babinski maneuver response is downgoing.   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 Exhaustion and exercise can be a trigger. 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 He is improved much on Valium.   He had spells while exercising , playing basketball with his brother, but less frequent.  .  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   He has insomnia, even on Valium.    Plan:  Treatment plan and additional workup :  # 3 month of Valium  0.5 mg in AM, to be repeated as needed in daytime. Vertigo is significantly reduced, but headaches have been more noted. Migraine 5 times in 3 month.  Re evaluation  after another 6  month- he will adjust to the new normal. He is back to full time work.  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       Asencion Partridge Dohmeier MD 10/24/2014

## 2014-12-19 ENCOUNTER — Telehealth: Payer: Self-pay

## 2014-12-19 NOTE — Telephone Encounter (Signed)
Dr. Brett Fairy is out of office 9/19. Called pt to r/s appt. Left a message asking him to call back to r/s his appt. Please place pt in a 30 minute spot.

## 2014-12-23 ENCOUNTER — Ambulatory Visit: Admitting: Neurology

## 2014-12-25 ENCOUNTER — Telehealth: Payer: Self-pay | Admitting: Neurology

## 2014-12-25 ENCOUNTER — Ambulatory Visit (INDEPENDENT_AMBULATORY_CARE_PROVIDER_SITE_OTHER): Admitting: Adult Health

## 2014-12-25 ENCOUNTER — Encounter: Payer: Self-pay | Admitting: Adult Health

## 2014-12-25 VITALS — BP 105/65 | HR 66 | Ht 71.0 in | Wt 169.0 lb

## 2014-12-25 DIAGNOSIS — R42 Dizziness and giddiness: Secondary | ICD-10-CM

## 2014-12-25 DIAGNOSIS — R51 Headache: Secondary | ICD-10-CM | POA: Diagnosis not present

## 2014-12-25 DIAGNOSIS — R55 Syncope and collapse: Secondary | ICD-10-CM

## 2014-12-25 DIAGNOSIS — R519 Headache, unspecified: Secondary | ICD-10-CM

## 2014-12-25 MED ORDER — DULOXETINE HCL 30 MG PO CPEP: 30.0000 mg | ORAL_CAPSULE | Freq: Every day | ORAL | Status: DC

## 2014-12-25 NOTE — Telephone Encounter (Signed)
Patient is coming to see Jinny Blossom, NP today.

## 2014-12-25 NOTE — Telephone Encounter (Signed)
Mother called to get an earlier appt for pt. He was r/s from 9/19 to 9/28 and felt that it was to long to wait. Pt was seen in the hospital on Saturday after passing out and hitting his head. Mother states pt is unable to function and is have lots of headaches. I spoke with nurse and she will see about an earlier appt for the pt. Nurse will call the mother back letting her know.

## 2014-12-25 NOTE — Patient Instructions (Signed)
Try Cymbalta 30 mg daily  If your symptoms worsen or you develop new symptoms please let us know.    Duloxetine delayed-release capsules What is this medicine? DULOXETINE (doo LOX e teen) is used to treat depression, anxiety, and different types of chronic pain. This medicine may be used for other purposes; ask your health care provider or pharmacist if you have questions. COMMON BRAND NAME(S): Cymbalta What should I tell my health care provider before I take this medicine? They need to know if you have any of these conditions: -bipolar disorder or a family history of bipolar disorder -glaucoma -kidney disease -liver disease -suicidal thoughts or a previous suicide attempt -taken medicines called MAOIs like Carbex, Eldepryl, Marplan, Nardil, and Parnate within 14 days -an unusual reaction to duloxetine, other medicines, foods, dyes, or preservatives -pregnant or trying to get pregnant -breast-feeding How should I use this medicine? Take this medicine by mouth with a glass of water. Follow the directions on the prescription label. Do not cut, crush or chew this medicine. You can take this medicine with or without food. Take your medicine at regular intervals. Do not take your medicine more often than directed. Do not stop taking this medicine suddenly except upon the advice of your doctor. Stopping this medicine too quickly may cause serious side effects or your condition may worsen. A special MedGuide will be given to you by the pharmacist with each prescription and refill. Be sure to read this information carefully each time. Talk to your pediatrician regarding the use of this medicine in children. While this drug may be prescribed for children as young as 70 years of age for selected conditions, precautions do apply. Overdosage: If you think you have taken too much of this medicine contact a poison control center or emergency room at once. NOTE: This medicine is only for you. Do not share  this medicine with others. What if I miss a dose? If you miss a dose, take it as soon as you can. If it is almost time for your next dose, take only that dose. Do not take double or extra doses. What may interact with this medicine? Do not take this medicine with any of the following medications: -certain diet drugs like dexfenfluramine, fenfluramine -desvenlafaxine -linezolid -MAOIs like Azilect, Carbex, Eldepryl, Marplan, Nardil, and Parnate -methylene blue (intravenous) -milnacipran -thioridazine -venlafaxine This medicine may also interact with the following medications: -alcohol -aspirin and aspirin-like medicines -certain antibiotics like ciprofloxacin and enoxacin -certain medicines for blood pressure, heart disease, irregular heart beat -certain medicines for depression, anxiety, or psychotic disturbances -certain medicines for migraine headache like almotriptan, eletriptan, frovatriptan, naratriptan, rizatriptan, sumatriptan, zolmitriptan -certain medicines that treat or prevent blood clots like warfarin, enoxaparin, and dalteparin -cimetidine -fentanyl -lithium -NSAIDS, medicines for pain and inflammation, like ibuprofen or naproxen -phentermine -procarbazine -sibutramine -St. John's wort -theophylline -tramadol -tryptophan This list may not describe all possible interactions. Give your health care provider a list of all the medicines, herbs, non-prescription drugs, or dietary supplements you use. Also tell them if you smoke, drink alcohol, or use illegal drugs. Some items may interact with your medicine. What should I watch for while using this medicine? Tell your doctor if your symptoms do not get better or if they get worse. Visit your doctor or health care professional for regular checks on your progress. Because it may take several weeks to see the full effects of this medicine, it is important to continue your treatment as prescribed by your doctor. Patients and  their families should watch out for new or worsening thoughts of suicide or depression. Also watch out for sudden changes in feelings such as feeling anxious, agitated, panicky, irritable, hostile, aggressive, impulsive, severely restless, overly excited and hyperactive, or not being able to sleep. If this happens, especially at the beginning of treatment or after a change in dose, call your health care professional. Dennis Bast may get drowsy or dizzy. Do not drive, use machinery, or do anything that needs mental alertness until you know how this medicine affects you. Do not stand or sit up quickly, especially if you are an older patient. This reduces the risk of dizzy or fainting spells. Alcohol may interfere with the effect of this medicine. Avoid alcoholic drinks. This medicine can cause an increase in blood pressure. This medicine can also cause a sudden drop in your blood pressure, which may make you feel faint and increase the chance of a fall. These effects are most common when you first start the medicine or when the dose is increased, or during use of other medicines that can cause a sudden drop in blood pressure. Check with your doctor for instructions on monitoring your blood pressure while taking this medicine. Your mouth may get dry. Chewing sugarless gum or sucking hard candy, and drinking plenty of water may help. Contact your doctor if the problem does not go away or is severe. What side effects may I notice from receiving this medicine? Side effects that you should report to your doctor or health care professional as soon as possible: -allergic reactions like skin rash, itching or hives, swelling of the face, lips, or tongue -changes in blood pressure -confusion -dark urine -dizziness -fast talking and excited feelings or actions that are out of control -fast, irregular heartbeat -fever -general ill feeling or flu-like symptoms -hallucination, loss of contact with reality -light-colored  stools -loss of balance or coordination -redness, blistering, peeling or loosening of the skin, including inside the mouth -right upper belly pain -seizures -suicidal thoughts or other mood changes -trouble concentrating -trouble passing urine or change in the amount of urine -unusual bleeding or bruising -unusually weak or tired -yellowing of the eyes or skin Side effects that usually do not require medical attention (report to your doctor or health care professional if they continue or are bothersome): -blurred vision -change in appetite -change in sex drive or performance -headache -increased sweating -nausea This list may not describe all possible side effects. Call your doctor for medical advice about side effects. You may report side effects to FDA at 1-800-FDA-1088. Where should I keep my medicine? Keep out of the reach of children. Store at room temperature between 15 and 30 degrees C (59 and 86 degrees F). Throw away any unused medicine after the expiration date. NOTE: This sheet is a summary. It may not cover all possible information. If you have questions about this medicine, talk to your doctor, pharmacist, or health care provider.  2015, [employer]vier/Gold Standard. (2013-03-13 14:20:31)

## 2014-12-25 NOTE — Progress Notes (Addendum)
PATIENT: Dustin [name] DOB: 1990-06-05  REASON FOR VISIT: follow up- dizziness HISTORY FROM: patient  HISTORY OF PRESENT ILLNESS: Dustin [name] is a 24 year old male with a history of dizziness. He returns today for an evaluation. He states that recently he was standing in formation for [employer]. He states he began to have a headache and then dizziness pursued. The next thing he remembers is waking up on the ground. He states that he was told he passed out. He was taken to the hospital in [place]. He states they completed a CT of the head that was normal. These results are not available to me. When he passed out there was no seizure activity noted. Denies any shaking or convulsing in the extremities. Denies loss of bowels or bladder. Denies biting his tongue or gums. The patient states that he's been having a "migraine" daily since the event. Before that he was having a present one headache a week. The patient describes his headache as being "all over." He denies any photophobia and phonophobia. He states that he will occasionally have some nausea but denies vomiting. The patient has been on Valium but did not notice benefit. He states that he stopped this medication approximately 4 days ago. The patient also states that he cannot stand l loud noise. He states this occurs at all times not just with a headache. For example he does not like to hear a dog bark or baby cry. The patient's mother is with him today. She states that 20 years ago she was having the same symptoms. She was diagnosed with serotonin deficiency. She was started on Cymbalta and her symptoms resolved. The patient returns today for an evaluation.  HISTORY 10/24/14 [facility]): Dustin [name] is a [age]-year-old Caucasian tended male who presents for an evaluation of vertigo. His wife are here together today reporting that Dustin [name] underwent a sinus surgery in September 2015 within days after the sinus surgery his sinus problems  resolved but he begun experiencing vertigo. An MRI was obtained the months following to surgery which showed no acute brain abnormalities it was done with and without contrast. There was still some sinus mucosal thickening noted. He also has a history of asthma and is taking antiallergy medicines and nasal spray. He describes his dizziness as a spinning sensation to the right felt that his surrounding spun around him and not that he was in motion spells occurred without any noticeable trigger. At times he would just watch TV or drive and was not aware of a rapid head movement or positional change bring it on. Some spells lasted a couple of minutes for other more severe one lasted a couple of hours. He usually has a spell once or several times a day he reports that he has never been woken out of sleep by a vertigo spell or that's his sleep is affected in any way by this. When he wakes up in the morning he does not wake up first feeling dizzy. He has some headaches but these seem to be unrelated to the vertigo spells. He did have some nausea with some of the vertigo spells but no diplopia he feels dizzy and lightheaded at times almost as if passing out but has never so far lost awareness or consciousness. 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  He had tried meclizine and his primary care first which  did not help he had seen Dr. Loretta Plume, D.O. Who prescribed topiramate concerning that this may be a migraine equivalent. This did not work.  Again the MRI was unremarkable in November 2015 ENT testing showed hearing loss and the possibility of a retrocochlear origin of vertigo needs to be evaluated.  REVIEW OF SYSTEMS: Out of a complete 14 system review of symptoms, the patient complains only of the following symptoms, and all other reviewed systems are negative.  ALLERGIES: Allergies  Allergen Reactions   . Penicillins Hives  . Tetracyclines & Related Hives  . Biaxin [Clarithromycin]   . Ceftin [Cefuroxime Axetil]     HOME MEDICATIONS: Outpatient Prescriptions Prior to Visit  Medication Sig Dispense Refill  . albuterol (PROVENTIL HFA;VENTOLIN HFA) 108 (90 BASE) MCG/ACT inhaler Inhale 1 puff into the lungs every 6 (six) hours as needed for wheezing or shortness of breath.    . fluticasone (FLONASE) 50 MCG/ACT nasal spray Place 1 spray into both nostrils daily.    . diazepam (VALIUM) 2 MG tablet Take 1/4 tab in AM and as needed in Pm. 30 tablet 0  . loratadine (CLARITIN) 10 MG tablet Take 10 mg by mouth daily.    Marland Kitchen PREDNISONE, PAK, PO Take by mouth.     No facility-administered medications prior to visit.    PAST MEDICAL HISTORY: Past Medical History  Diagnosis Date  . Hearing loss   . Headache   . Asthma   . Vertigo     PAST SURGICAL HISTORY: Past Surgical History  Procedure Laterality Date  . Sinus endo w/fusion      FAMILY HISTORY: Family History  Problem Relation Age of Onset  . Cancer Father     throat   . Cancer Paternal Grandfather   . Migraines Mother     SOCIAL HISTORY: Social History   Social History  . Marital Status: Married    Spouse Name: [name]  . Number of Children: 1  . Years of Education: [employer]   Occupational History  . Not on file.   Social History Main Topics  . Smoking status: Former Smoker    Quit date: 04/05/2010  . Smokeless tobacco: Never Used  . Alcohol Use: 0.0 oz/week    0 Standard drinks or equivalent per week     Comment: social  . Drug Use: No  . Sexual Activity:    Partners: Female   Other Topics Concern  . Not on file   Social History Narrative   Patient lives at home with his wife [name]).   Patient is in the [employer]    Right handed.   Caffeine sweet tea once daily.      PHYSICAL EXAM  Filed Vitals:   12/25/14 1446  BP: 105/65  Pulse: 66  Height: 5\' 11"  (1.803 m)  Weight: 169 lb (76.658 kg)    Orthostatic VS for the past 24 hrs:  BP- Lying Pulse- Lying BP- Sitting Pulse- Sitting BP- Standing at 0 minutes Pulse- Standing at 0 minutes  12/25/14 1455 105/65 mmHg 78 104/70 mmHg 75 104/70 mmHg 75      Body mass index is 23.58 kg/(m^2).  Generalized: Well developed, in no acute distress   Neurological examination  Mentation: Alert oriented to time, place, history taking. Follows all commands speech and language fluent Cranial nerve II-XII: Pupils were equal round reactive to light. Extraocular movements were full, visual field were full on confrontational test. Facial sensation and strength were normal. Uvula tongue midline. Head turning and  shoulder shrug  were normal and symmetric. Motor: The motor testing reveals 5 over 5 strength of all 4 extremities. Good symmetric motor tone is noted throughout.  Sensory: Sensory testing is intact to soft touch on all 4 extremities. No evidence of extinction is noted.  Coordination: Cerebellar testing reveals good finger-nose-finger and heel-to-shin bilaterally.  Gait and station: Gait is normal. Tandem gait is normal. Romberg is negative. No drift is seen.  Reflexes: Deep tendon reflexes are symmetric and normal bilaterally.   DIAGNOSTIC DATA (LABS, IMAGING, TESTING) - I reviewed patient records, labs, notes, testing and imaging myself where available.  ASSESSMENT AND PLAN 24 y.o. year old male  has a past medical history of Hearing loss; Headache; Asthma; and Vertigo. here with:  1. Headache 2. Syncopal event 3. Dizziness  I consulted with Dr. Jaynee Eagles regarding this patient's plan of care. I will start the patient on a low dose of Cymbalta 30 mg daily to help with his headaches. I suggest that the patient make an appointment with his primary care for evaluation of syncope. A cardiac evaluation is warranted. The patient has had a CT scan of the brain that was normal per the patient. In the past he's had TCD that was normal. The patient and  his mother verbalized understanding. Patient was advised that if his symptoms worsen or he develops new symptoms he should let us know. He will follow-up in one to 2 months with Dr. Mechele Claude, MSN, NP-C 12/25/2014, 2:55 PM [facility] [address], [place], [place] [address] [phone]   Personally  participated in and made any corrections needed to history, physical, neuro exam,assessment and plan as stated above.  Sarina Ill, MD [facility]

## 2014-12-25 NOTE — Telephone Encounter (Signed)
Dr. Brett Fairy is unable to see the pt this afternoon or tomorrow. Pt is able to come in tomorrow afternoon, is agreeable to seeing another physician, and wants to be seen if at all possible tomorrow.  [name], Dr. Jannifer Franklin' nurse, advised me that pt could be worked in tomorrow for a 12:00 appt. I alerted the pt, and is agreeable to coming in tomorrow at 11:45 to see Dr. Jannifer Franklin.

## 2014-12-31 ENCOUNTER — Telehealth: Payer: Self-pay | Admitting: Neurology

## 2014-12-31 DIAGNOSIS — G43109 Migraine with aura, not intractable, without status migrainosus: Secondary | ICD-10-CM

## 2014-12-31 DIAGNOSIS — R112 Nausea with vomiting, unspecified: Secondary | ICD-10-CM

## 2014-12-31 MED ORDER — ONDANSETRON HCL 4 MG PO TABS: 4.0000 mg | ORAL_TABLET | Freq: Three times a day (TID) | ORAL | Status: DC | PRN

## 2014-12-31 NOTE — Telephone Encounter (Signed)
Spoke to pt's wife. Cymbalta is working great for pt's headaches, he has not had a single HA since starting the cymbalta.  However, pt has been quite nauseous while on cymbalta. He has been throwing up. They want recommendations on anti-nausea medications to help cope.  I advised pt's wife that I would call her back with Dr. Edwena Felty recommendations and if/which RX she prescribed.

## 2014-12-31 NOTE — Telephone Encounter (Signed)
Patient's wife is calling in regard to the Cymbalta 37 her husband is taking.  The Medication continues to make him nauseous and he would like to know if there is a another medication he can take for the nausea for a short while.  Please call.

## 2014-12-31 NOTE — Telephone Encounter (Signed)
Spoke to pt's wife and advised her that Dr. Brett Fairy recommends starting zofran to help with the pt's nausea. I advised 1 tablet q8hrs prn for nausea and vomiting and it was sent to [facility] in [place]. Pt's wife states that if his nausea hasn't improved in the next 7-10 days that she will call us back to discuss perhaps a medication change from the cymbalta.

## 2015-01-01 ENCOUNTER — Ambulatory Visit: Payer: Self-pay | Admitting: Neurology

## 2015-01-01 MED ORDER — DULOXETINE HCL 20 MG PO CPEP: 30.0000 mg | ORAL_CAPSULE | Freq: Two times a day (BID) | ORAL | Status: DC

## 2015-01-01 NOTE — Telephone Encounter (Signed)
Wife/Dustin [name] [phone] called regarding Cymbalta. Wife states husband is sleeping non stop. Wonders if dose is too high? Please call wife or patient back.

## 2015-01-01 NOTE — Addendum Note (Signed)
Addended by: Larey Seat on: 01/01/2015 05:42 PM   Modules accepted: Orders

## 2015-01-01 NOTE — Telephone Encounter (Signed)
I yesterday ordered Zofran for the patient's nausea. He is still dizzy but he does not have as many headaches. I am unfamiliar with the Cymbalta causing hypersomnia in daytime and  may need to reduce his dose indeed . I will call average at night and see if we can find a lower dosage for the patient. I called avery but got no response. Left VM. CD

## 2015-01-01 NOTE — Telephone Encounter (Signed)
Pt's wife states that pt is now sleeping 9-10 hours a day while on cymbalta, when the pt usually sleeps 4-5 hours a day. He even has to take a nap during the day.  Pt's cymbalta is not helping dizziness but is helping headaches.  Pt's wife is wondering if there is another anti-depressant, or if the cymbalta is too high of a dose? They are willing to stay on the cymbalta for another week to see if it helps dizziness as well.  I advised pt that I would call her back with recommendations from Dr. Brett Fairy.

## 2015-01-02 NOTE — Telephone Encounter (Signed)
Spoke to pt's wife and she received Dr. Edwena Felty VM and said that the zofran is helping with the nausea and they are picking up the 20 mg cymbalta today. Pt's wife said she would call us back with further questions or concerns.

## 2015-01-27 ENCOUNTER — Encounter: Payer: Self-pay | Admitting: Neurology

## 2015-01-27 ENCOUNTER — Ambulatory Visit (INDEPENDENT_AMBULATORY_CARE_PROVIDER_SITE_OTHER): Admitting: Neurology

## 2015-01-27 VITALS — BP 144/80 | HR 72 | Resp 20 | Ht 71.0 in | Wt 174.0 lb

## 2015-01-27 DIAGNOSIS — H8143 Vertigo of central origin, bilateral: Secondary | ICD-10-CM | POA: Diagnosis not present

## 2015-01-27 DIAGNOSIS — R519 Headache, unspecified: Secondary | ICD-10-CM

## 2015-01-27 DIAGNOSIS — H814 Vertigo of central origin: Secondary | ICD-10-CM | POA: Insufficient documentation

## 2015-01-27 DIAGNOSIS — R51 Headache: Secondary | ICD-10-CM | POA: Diagnosis not present

## 2015-01-27 HISTORY — DX: Headache, unspecified: R51.9

## 2015-01-27 MED ORDER — DULOXETINE HCL 20 MG PO CPEP: 30.0000 mg | ORAL_CAPSULE | Freq: Two times a day (BID) | ORAL | Status: DC

## 2015-01-27 NOTE — Progress Notes (Signed)
PATIENT: Dustin [name] DOB: 09-30-1990  REASON FOR VISIT: follow up- dizziness HISTORY FROM: patient  HISTORY OF PRESENT ILLNESS: [name] is a 24 year old male with a history of dizziness. He returns today for an evaluation. He states that recently he was standing in formation for [employer]. He states he began to have a headache and then dizziness pursued. The next thing he remembers is waking up on the ground. He states that he was told he passed out. He was taken to the hospital in [place]. He states they completed a CT of the head that was normal. These results are not available to me. When he passed out there was no seizure activity noted. Denies any shaking or convulsing in the extremities. Denies loss of bowels or bladder. Denies biting his tongue or gums. The patient states that he's been having a "migraine" daily since the event. Before that he was having a present one headache a week. The patient describes his headache as being "all over." He denies any photophobia and phonophobia. He states that he will occasionally have some nausea but denies vomiting. The patient has been on Valium but did not notice benefit. He states that he stopped this medication approximately 4 days ago. The patient also states that he cannot stand  loud noises. He states this occurs at all times -not just with a headache. For example he does not like to hear a dog bark or baby cry.  The patient's mother is with him today.  She states that 20 years ago she was having the same symptoms. She was diagnosed with serotonin deficiency. She was started on Cymbalta and her symptoms resolved. MM, Sept 2016.  Interval history from 01-27-15, [name], a 24 year old Caucasian married gentleman is here today for follow-up visit. He has responded very well to the Cymbalta medication which has helped him to eliminate headaches. Unfortunately it has not affected his dizziness or vertigo sensation. He reports daily  lightheadedness feeling all day long waxing and waning but being almost never symptom-free. This has become a chronic condition for him and he is frustrated. The onset of vertigo and dizziness sensations was correlated to his sinus surgery.   HISTORY 10/24/14 [facility]): [name] is a [age]-year-old Caucasian tended male who presents for an evaluation of vertigo. His wife are here together today reporting that Mr. Langhorst underwent a sinus surgery in September 2015 within days after the sinus surgery -his sinus problems resolved but he begun experiencing vertigo.  An MRI was obtained the months following to surgery which showed no acute brain abnormalities it was done with and without contrast. There was still some sinus mucosal thickening noted. He also has a history of asthma and is taking antiallergy medicines and nasal spray. He describes his dizziness as a spinning sensation to the right felt that his surrounding spun around him and not that he was in motion spells occurred without any noticeable trigger. At times he would just watch TV or drive and was not aware of a rapid head movement or positional change bring it on. Some spells lasted a couple of minutes for other more severe one lasted a couple of hours. He usually has a spell once or several times a day he reports that he has never been woken out of sleep by a vertigo spell or that's his sleep is affected in any way by this. When he wakes up in the morning he does not wake up first feeling dizzy.  He has some headaches but these seem to be unrelated to the vertigo spells. He did have some nausea with some of the vertigo spells but no diplopia he feels dizzy and lightheaded at times almost as if passing out but has never so far lost awareness or consciousness. 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  He had tried meclizine and his primary care  first which did not help he had seen Pleas Koch, D.O. who prescribed topiramate concerning that this may be a migraine equivalent. This did not work.  Again the MRI was unremarkable in November 2015 ENT testing showed hearing loss and the possibility of a retrocochlear origin of vertigo needs to be evaluated.  His ENT was at [facility]     REVIEW OF SYSTEMS: Out of a complete 14 system review of symptoms, the patient complains only of the following symptoms, and all other reviewed systems are negative.  ALLERGIES: Allergies  Allergen Reactions  . Penicillins Hives  . Tetracyclines & Related Hives  . Biaxin [Clarithromycin]   . Ceftin [Cefuroxime Axetil]     HOME MEDICATIONS: Outpatient Prescriptions Prior to Visit  Medication Sig Dispense Refill  . albuterol (PROVENTIL HFA;VENTOLIN HFA) 108 (90 BASE) MCG/ACT inhaler Inhale 1 puff into the lungs every 6 (six) hours as needed for wheezing or shortness of breath.    . DULoxetine (CYMBALTA) 20 MG capsule Take 2 capsules (40 mg total) by mouth 2 (two) times daily. 60 capsule 1  . fluticasone (FLONASE) 50 MCG/ACT nasal spray Place 1 spray into both nostrils daily.    . ondansetron (ZOFRAN) 4 MG tablet Take 1 tablet (4 mg total) by mouth every 8 (eight) hours as needed for nausea or vomiting. 30 tablet 3   No facility-administered medications prior to visit.    PAST MEDICAL HISTORY: Past Medical History  Diagnosis Date  . Hearing loss   . Headache   . Asthma   . Vertigo   . Bilateral headaches 01/27/2015    PAST SURGICAL HISTORY: Past Surgical History  Procedure Laterality Date  . Sinus endo w/fusion      FAMILY HISTORY: Family History  Problem Relation Age of Onset  . Cancer Father     throat   . Cancer Paternal Grandfather   . Migraines Mother     SOCIAL HISTORY: Social History   Social History  . Marital Status: Married    Spouse Name: [name]  . Number of Children: 1  . Years of Education: [employer]     Occupational History  . Not on file.   Social History Main Topics  . Smoking status: Former Smoker    Quit date: 04/05/2010  . Smokeless tobacco: Never Used  . Alcohol Use: 0.0 oz/week    0 Standard drinks or equivalent per week     Comment: social  . Drug Use: No  . Sexual Activity:    Partners: Female   Other Topics Concern  . Not on file   Social History Narrative   Patient lives at home with his wife [name]).   Patient is in the [employer]    Right handed.   Caffeine sweet tea once daily.      PHYSICAL EXAM  Filed Vitals:   01/27/15 0923  BP: 144/80  Pulse: 72  Resp: 20  Height: 5\' 11"  (1.803 m)  Weight: 174 lb (78.926 kg)  No data found.     Body mass index is 24.28 kg/(m^2).  Generalized: Well developed, in no acute distress . Well groomed, compliant and cooperative.  He has ongoing nasal congestion, but much improved since surgery. There are some ciliary injection in both eyes.  Neurological examination  Mentation: Alert oriented to time, place, history taking. Follows all commands speech and language fluent Cranial nerve: No change in smell or taste. Pupils were equal round reactive to light. Extraocular movements were full, visual field were full on confrontational test.  When I move the patient's head but his eyes were closed he did not experience a significant degree of dizziness per se. There was no nystagmus noted even after rotation and forward backward flexion. He was able to describe his symptoms better when he closed his eyes again and he stated that he felt a  clockwise rotation. Again no nystagmus is noted.   Facial sensation and strength were normal. Uvula tongue midline. Head turning and shoulder shrug  were normal and symmetric. Motor: The motor testing reveals 5 / 5 strength of all  extremities. Good symmetric motor tone is noted throughout.  Sensory: Sensory testing is intact to soft touch on all extremities. Coordination: Cerebellar  testing reveals good finger-nose-finger bilaterally. No tremor, ataxia or dysmetria.  Gait and station: Gait is stable and normal. Tandem gait is normal.  Romberg is negative. No drift is seen.  Reflexes: Deep tendon reflexes are symmetric and normal bilaterally.   DIAGNOSTIC DATA (LABS, IMAGING, TESTING) - I reviewed patient records, labs, notes, testing and imaging myself where available. Ct head was reviewed with the images displayed and  discussed on my lab top.   ASSESSMENT AND PLAN 24 y.o. year old male  has a past medical history of Hearing loss; Headache; Asthma; Vertigo; and Bilateral headaches (01/27/2015). here with:  1. Headache - proved on Cymbalta 2. Syncopal event no new syncope since June 2016 3. Dizziness-continues, throughout the day daily. There is still a vertical component but no associated nystagmus. I will ask my colleagues in vestibular rehabilitation to evaluate Mr. Juenger. Unfortunately the vertigo was not suppressed by Valium. He is currently on no vertigo suppressant medication. He developed this vertigo after anesthesia and sinus surgery, felt "never right ".  Ward Givens NP started the patient in September on a low dose of Cymbalta 30 mg daily and this helped with his headaches.  I suggest that the patient make an appointment with his primary care for evaluation of syncope.  A cardiac evaluation was completed the patient underwent cardiac monitoring and an echocardiogram. Both were reported as normal . The patient has had a CT scan of the brain that was normal per the patient. In the past he's had TCD that was normal- interpreted on 09-26-14 by Dr. Antony Contras .   I have not found a way to treat his vertigo and defer to vestibular rehabilitation.  RV prn , Cymbalta refills.    01/27/2015, 9:49 AM [facility] [address] [place], [place] [address] [phone]     [facility]

## 2015-01-27 NOTE — Patient Instructions (Signed)

## 2015-02-03 ENCOUNTER — Telehealth: Payer: Self-pay | Admitting: Neurology

## 2015-02-03 NOTE — Telephone Encounter (Signed)
Wife called to request a note for the [employer]. 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 Dr. Brett Fairy treats for dizzy spells, serotonin deficiency). Patient has physical therapy tomorrow morning at 9:30am. Would like to pick this note up in the morning.

## 2015-02-03 NOTE — Telephone Encounter (Signed)
Advised pt's wife that the letter from Dr. Brett Fairy was ready for pick up at the front desk Pt verbalized understanding.

## 2015-02-04 ENCOUNTER — Ambulatory Visit: Attending: Neurology | Admitting: Physical Therapy

## 2015-02-04 DIAGNOSIS — R42 Dizziness and giddiness: Secondary | ICD-10-CM | POA: Insufficient documentation

## 2015-02-06 ENCOUNTER — Encounter: Payer: Self-pay | Admitting: Physical Therapy

## 2015-02-06 NOTE — Patient Instructions (Signed)
Gaze Stabilization: Standing Feet Apart    Feet shoulder width apart, keeping eyes on target on wall _6-8___ feet away, tilt head down 15-30 and move head side to side for _60___ seconds. Repeat while moving head up and down for _60___ seconds.  Do 3-5 times/day; place target on patterned background   Feet Apart (Compliant Surface) Arm Motion - Eyes Closed    Stand on compliant surface: ___pillow_____, feet shoulder width apart. Close eyes and move arms up and down: to front. Repeat __1__ times per session. Do 1-2____ sessions per day.  Copyright  VHI. All rights reserved.    Copyright  VHI. All rights reserved.

## 2015-02-06 NOTE — Therapy (Signed)
[facility] [address] [place], [place], [address] Phone: [phone]   Fax:  [phone]  Physical Therapy Evaluation  Patient Details  Name: Dustin [name] MRN: [id number] Date of Birth: July 06, 1990 Referring Provider: Dr. Brett Fairy  Encounter Date: 02/04/2015      PT End of Session - 02/06/15 1251    Visit Number 1   Number of Visits 4   Date for PT Re-Evaluation 03/08/15   Authorization Type [facility]   PT Start Time 0933   PT Stop Time 1016   PT Time Calculation (min) 43 min      Past Medical History  Diagnosis Date  . Hearing loss   . Headache   . Asthma   . Vertigo   . Bilateral headaches 01/27/2015    Past Surgical History  Procedure Laterality Date  . Sinus endo w/fusion      There were no vitals filed for this visit.  Visit Diagnosis:  Dizziness and giddiness - Plan: PT plan of care cert/re-cert      Subjective Assessment - 02/06/15 1233    Subjective Pt. 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 No/denies            [facility] PT Assessment - 02/06/15 0001    Assessment   Medical Diagnosis Vertigo   Referring Provider Dr. Brett Fairy   Onset Date/Surgical Date --  Sept. 2015   Balance Screen   Has the patient fallen in the past 6 months No   Has the patient had a decrease in activity level because of a fear of falling?  No   Is the patient reluctant to leave their home because of a fear of falling?  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            Vestibular Assessment - 02/06/15 0001    Symptom Behavior   Type of Dizziness "World moves"   Frequency of Dizziness intermittent   Duration of Dizziness  varies -minutes usually   Aggravating Factors Driving   Relieving Factors Rest;Closing eyes   Occulomotor Exam   Occulomotor Alignment Normal   Spontaneous Absent   Smooth Pursuits --  horizontal - some initial jerkiness noted   Saccades Intact   Visual Acuity   Static line 10   Dynamic line 8  WNL's   Equities trader Comment composite score 47/100 with N= 70/100; pt had fall on trial 1 of condition 4, fall ontrial 1 condition 5 and falls on all 3 trials condition 6                       PT Education - 02/06/15 1251    Education provided Yes   Education Details gaze stabilization and balance on foam   Person(s) Educated Patient   Methods Explanation;Demonstration;Handout   Comprehension Verbalized understanding;Returned demonstration             PT Long Term Goals - 02/06/15 1255    PT LONG TERM GOAL #1   Title Improve SOT score to >/= 65/100 for improved balance.  (03-08-15)   Baseline score 47/100  Time 4   Period Weeks   Status New   PT LONG TERM GOAL #2   Title Report at least 50% improvement in vertigo  (03-08-15)   Time 4   Period Weeks   Status New   PT LONG TERM GOAL #3   Title Independent in HEP for vestibular exercises.   Time 4   Period Weeks   Status New               Plan - 02/06/15 1252    Clinical Impression Statement Pt has decr. visual and vestibular inputs per SOT - vertigo appears to be of central origin - appears to be decr. oculomotor function/decr. VOR   Pt will benefit from skilled therapeutic intervention in order to improve on the following deficits Dizziness;Difficulty walking   Rehab Potential Good   PT Frequency 1x / week   PT Duration 4 weeks   PT Treatment/Interventions ADLs/Self Care Home Management;Therapeutic exercise;Therapeutic activities;Functional mobility training;Neuromuscular re-education;Patient/family education;Balance training;Vestibular    PT Next Visit Plan check HEP - balance/vestibular exercises   PT Home Exercise Plan gaze stabilization, balance on foam   Consulted and Agree with Plan of Care Patient         Problem List Patient Active Problem List   Diagnosis Date Noted  . Bilateral headaches 01/27/2015  . Vertigo, central 01/27/2015  . Vertigo of central origin 09/10/2014  . Dizziness and giddiness 09/10/2014  . Episodic recurrent vertigo 08/12/2014    Alda Lea, PT  02/06/2015, 1:07 PM  [facility] [address] [place], [place], [address] Phone: [phone]   Fax:  [phone]  Name: Dustin [name] MRN: [id number] Date of Birth: 12-20-1990

## 2015-02-18 ENCOUNTER — Ambulatory Visit: Admitting: Physical Therapy

## 2015-02-20 ENCOUNTER — Encounter: Admitting: Physical Therapy

## 2015-03-03 ENCOUNTER — Ambulatory Visit: Admitting: Physical Therapy

## 2015-04-10 DIAGNOSIS — Z0289 Encounter for other administrative examinations: Secondary | ICD-10-CM

## 2015-04-23 ENCOUNTER — Ambulatory Visit: Admitting: Adult Health

## 2015-04-29 ENCOUNTER — Encounter: Payer: Self-pay | Admitting: Adult Health

## 2015-04-29 ENCOUNTER — Ambulatory Visit (INDEPENDENT_AMBULATORY_CARE_PROVIDER_SITE_OTHER): Admitting: Adult Health

## 2015-04-29 VITALS — BP 110/76 | HR 85 | Ht 71.0 in | Wt 175.5 lb

## 2015-04-29 DIAGNOSIS — H8143 Vertigo of central origin, bilateral: Secondary | ICD-10-CM

## 2015-04-29 DIAGNOSIS — R51 Headache: Secondary | ICD-10-CM

## 2015-04-29 DIAGNOSIS — R519 Headache, unspecified: Secondary | ICD-10-CM

## 2015-04-29 MED ORDER — DULOXETINE HCL 20 MG PO CPEP: 20.0000 mg | ORAL_CAPSULE | Freq: Every day | ORAL | Status: DC

## 2015-04-29 NOTE — Patient Instructions (Signed)
Continue Cymabalta Continue with vestibular rehab If your symptoms worsen or you develop new symptoms please let us know.

## 2015-04-29 NOTE — Progress Notes (Signed)
PATIENT: Dustin [name] DOB: November 28, 1990  REASON FOR VISIT: follow up- headache, dizziness HISTORY FROM: patient  HISTORY OF PRESENT ILLNESS: [name] is a 25 year old male with a history of headaches and dizziness. He returns today for evaluation. He was started on Cymbalta and reports that has been beneficial for his headaches. He states that he has approximately one headache a week. The patient continues to have dizziness. His dizzy is can occur when he is sitting he does not always occur with position changes. The patient describes his dizziness as a room spinning sensation. The patient went to 1 appointment for vestibular rehabilitation. He did not find it beneficial so he did not follow-up. He returns today for an evaluation.  HISTORY:  [name] is a 25 year old male with a history of dizziness. He returns today for an evaluation. He states that recently he was standing in formation for [employer]. He states he began to have a headache and then dizziness pursued. The next thing he remembers is waking up on the ground. He states that he was told he passed out. He was taken to the hospital in [place]. He states they completed a CT of the head that was normal. These results are not available to me. When he passed out there was no seizure activity noted. Denies any shaking or convulsing in the extremities. Denies loss of bowels or bladder. Denies biting his tongue or gums. The patient states that he's been having a "migraine" daily since the event. Before that he was having a present one headache a week. The patient describes his headache as being "all over." He denies any photophobia and phonophobia. He states that he will occasionally have some nausea but denies vomiting. The patient has been on Valium but did not notice benefit. He states that he stopped this medication approximately 4 days ago. The patient also states that he cannot stand loud noises. He states this occurs at all times  -not just with a headache. For example he does not like to hear a dog bark or baby cry.  The patient's mother is with him today.  She states that 20 years ago she was having the same symptoms. She was diagnosed with serotonin deficiency. She was started on Cymbalta and her symptoms resolved. MM, Sept 2016.  Interval history from 01-27-15, [name], a 25 year old Caucasian married gentleman is here today for follow-up visit. He has responded very well to the Cymbalta medication which has helped him to eliminate headaches. Unfortunately it has not affected his dizziness or vertigo sensation. He reports daily lightheadedness feeling all day long waxing and waning but being almost never symptom-free. This has become a chronic condition for him and he is frustrated. The onset of vertigo and dizziness sensations was correlated to his sinus surgery.  REVIEW OF SYSTEMS: Out of a complete 14 system review of symptoms, the patient complains only of the following symptoms, and all other reviewed systems are negative.  See history of present illness  ALLERGIES: Allergies  Allergen Reactions  . Penicillins Hives  . Tetracyclines & Related Hives  . Biaxin [Clarithromycin]   . Ceftin [Cefuroxime Axetil]     HOME MEDICATIONS: Outpatient Prescriptions Prior to Visit  Medication Sig Dispense Refill  . albuterol (PROVENTIL HFA;VENTOLIN HFA) 108 (90 BASE) MCG/ACT inhaler Inhale 1 puff into the lungs every 6 (six) hours as needed for wheezing or shortness of breath.    . DULoxetine (CYMBALTA) 20 MG capsule Take 2 capsules (40 mg  total) by mouth 2 (two) times daily. (Patient taking differently: Take 20 mg by mouth daily. ) 60 capsule 1  . fluticasone (FLONASE) 50 MCG/ACT nasal spray Place 1 spray into both nostrils daily.    . ondansetron (ZOFRAN) 4 MG tablet Take 1 tablet (4 mg total) by mouth every 8 (eight) hours as needed for nausea or vomiting. 30 tablet 3   No facility-administered medications prior  to visit.    PAST MEDICAL HISTORY: Past Medical History  Diagnosis Date  . Hearing loss   . Headache   . Asthma   . Vertigo   . Bilateral headaches 01/27/2015    PAST SURGICAL HISTORY: Past Surgical History  Procedure Laterality Date  . Sinus endo w/fusion      FAMILY HISTORY: Family History  Problem Relation Age of Onset  . Cancer Father     throat   . Cancer Paternal Grandfather   . Migraines Mother     SOCIAL HISTORY: Social History   Social History  . Marital Status: Married    Spouse Name: [name]  . Number of Children: 1  . Years of Education: [employer]   Occupational History  . Not on file.   Social History Main Topics  . Smoking status: Former Smoker    Quit date: 04/05/2010  . Smokeless tobacco: Never Used  . Alcohol Use: 0.0 oz/week    0 Standard drinks or equivalent per week     Comment: social  . Drug Use: No  . Sexual Activity:    Partners: Female   Other Topics Concern  . Not on file   Social History Narrative   Patient lives at home with his wife [name]).   Patient is in the [employer]    Right handed.   Caffeine sweet tea once daily.      PHYSICAL EXAM  Filed Vitals:   04/29/15 0933  BP: 110/76  Pulse: 85  Height: 5\' 11"  (1.803 m)  Weight: 175 lb 8 oz (79.606 kg)   Body mass index is 24.49 kg/(m^2).  Generalized: Well developed, in no acute distress   Neurological examination  Mentation: Alert oriented to time, place, history taking. Follows all commands speech and language fluent Cranial nerve II-XII: Pupils were equal round reactive to light. Extraocular movements were full, visual field were full on confrontational test. Facial sensation and strength were normal. Uvula tongue midline. Head turning and shoulder shrug  were normal and symmetric. Horizontal end beat nystagmus to the left. Motor: The motor testing reveals 5 over 5 strength of all 4 extremities. Good symmetric motor tone is noted throughout.  Sensory: Sensory  testing is intact to soft touch on all 4 extremities. No evidence of extinction is noted.  Coordination: Cerebellar testing reveals good finger-nose-finger and heel-to-shin bilaterally.  Gait and station: Gait is normal. Tandem gait is normal. Romberg is negative. No drift is seen.  Reflexes: Deep tendon reflexes are symmetric and normal bilaterally.   DIAGNOSTIC DATA (LABS, IMAGING, TESTING) - I reviewed patient records, labs, notes, testing and imaging myself where available.     ASSESSMENT AND PLAN 25 y.o. year old male  has a past medical history of Hearing loss; Headache; Asthma; Vertigo; and Bilateral headaches (01/27/2015). here with:  1. Headaches 2. Vertigo  The patient will continue on Cymbalta. He cannot tolerate higher dosages. I refilled his prescription for 20 mg daily at bedtime. Patient is encouraged to follow-up with vestibular rehabilitation. It was explained to the patient that his dizziness  may actually get worse before it gets better with rehabilitation. Patient verbalized understanding. He will follow-up in 4-5 months or sooner if needed.     Ward Givens, MSN, NP-C 04/29/2015, 9:50 AM [facility] [address], [place], [place] [address] [phone]

## 2015-04-30 NOTE — Progress Notes (Signed)
I agree with the assessment and plan as directed by NP .The patient is known to me .   DOHMEIER,CARMEN, MD  

## 2015-05-26 ENCOUNTER — Encounter: Payer: Self-pay | Admitting: Physical Therapy

## 2015-05-26 NOTE — Therapy (Signed)
[facility] [address] [place], [place], [address] Phone: [phone]   Fax:  [phone]  Patient Details  Name: Dustin [name] MRN: [id number] Date of Birth: 10-Sep-1990 Referring Provider:  No ref. provider found  Encounter Date: 05/26/2015   Pt attended initial evaluation only - has not returned for any follow up PT appts to address vertigo/vestibular function.  Exercises were given during initial eval but I am unable to assess compliance/effectiveness as pt has not returned for any treatment sessions. Discharge due to pt not returning to PT.     [id number], PT 05/26/2015, 8:22 AM  [facility] [address] [place], [place], [address] Phone: [phone]   Fax:  [phone]

## 2015-08-27 ENCOUNTER — Ambulatory Visit (INDEPENDENT_AMBULATORY_CARE_PROVIDER_SITE_OTHER): Admitting: Adult Health

## 2015-08-27 ENCOUNTER — Encounter: Payer: Self-pay | Admitting: Adult Health

## 2015-08-27 VITALS — BP 110/78 | HR 72 | Ht 71.0 in | Wt 176.0 lb

## 2015-08-27 DIAGNOSIS — H8143 Vertigo of central origin, bilateral: Secondary | ICD-10-CM | POA: Diagnosis not present

## 2015-08-27 DIAGNOSIS — R51 Headache: Secondary | ICD-10-CM | POA: Diagnosis not present

## 2015-08-27 DIAGNOSIS — R519 Headache, unspecified: Secondary | ICD-10-CM

## 2015-08-27 MED ORDER — DULOXETINE HCL 30 MG PO CPEP: 30.0000 mg | ORAL_CAPSULE | Freq: Every day | ORAL | Status: DC

## 2015-08-27 NOTE — Patient Instructions (Signed)
Increase Cymbalta to 30 mg daily  Referral sent for vestibular rehab If your symptoms worsen or you develop new symptoms please let us know.

## 2015-08-27 NOTE — Progress Notes (Addendum)
PATIENT: Dustin [name] DOB: March 18, 1991  REASON FOR VISIT: follow up- headache, dizziness HISTORY FROM: patient  HISTORY OF PRESENT ILLNESS: Mr. Dustin [name] is a 25 year old male with a history of headaches and dizziness. He returns today for follow-up. He reports that initially Cymbalta was working well for his headaches and his mood. However now he reports that he typically has a headache every other day. His headache is only located in the frontal region in the center or the back of the head. He does have photophobia but denies photophobia and nausea and vomiting. He states that he will take a Goody powder to relieve his headache. He states that he only takes [place] Powder 1-2 times a week. The patient states that he continues to have dizziness. He describes this as a spinning sensation. These events can last for minutes or up to an hour. The patient did go for 1 session of this to the rehabilitation and was supposed to follow up but has not done this. He returns today for an evaluation.  HISTORY 04/29/15: Mr. Dustin [name] is a 25 year old male with a history of headaches and dizziness. He returns today for evaluation. He was started on Cymbalta and reports that has been beneficial for his headaches. He states that he has approximately one headache a week. The patient continues to have dizziness. His dizzy is can occur when he is sitting he does not always occur with position changes. The patient describes his dizziness as a room spinning sensation. The patient went to 1 appointment for vestibular rehabilitation. He did not find it beneficial so he did not follow-up. He returns today for an evaluation.  HISTORY: Mr. Dustin [name] is a 25 year old male with a history of dizziness. He returns today for an evaluation. He states that recently he was standing in formation for [employer]. He states he began to have a headache and then dizziness pursued. The next thing he remembers is waking up on the ground. He states  that he was told he passed out. He was taken to the hospital in [place]. He states they completed a CT of the head that was normal. These results are not available to me. When he passed out there was no seizure activity noted. Denies any shaking or convulsing in the extremities. Denies loss of bowels or bladder. Denies biting his tongue or gums. The patient states that he's been having a "migraine" daily since the event. Before that he was having a present one headache a week. The patient describes his headache as being "all over." He denies any photophobia and phonophobia. He states that he will occasionally have some nausea but denies vomiting. The patient has been on Valium but did not notice benefit. He states that he stopped this medication approximately 4 days ago. The patient also states that he cannot stand loud noises. He states this occurs at all times -not just with a headache. For example he does not like to hear a dog bark or baby cry.  The patient's mother is with him today.  She states that 20 years ago she was having the same symptoms. She was diagnosed with serotonin deficiency. She was started on Cymbalta and her symptoms resolved. MM, Sept 2016.  Interval history from 01-27-15, Mr. Dustin [name], a 25 year old Caucasian married gentleman is here today for follow-up visit. He has responded very well to the Cymbalta medication which has helped him to eliminate headaches. Unfortunately it has not affected his dizziness or vertigo sensation. He reports daily  lightheadedness feeling all day long waxing and waning but being almost never symptom-free. This has become a chronic condition for him and he is frustrated. The onset of vertigo and dizziness sensations was correlated to his sinus surgery.  REVIEW OF SYSTEMS: Out of a complete 14 system review of symptoms, the patient complains only of the following symptoms, and all other reviewed systems are negative.  See history of present  illness  ALLERGIES: Allergies  Allergen Reactions  . Penicillins Hives  . Tetracyclines & Related Hives  . Biaxin [Clarithromycin]   . Ceftin [Cefuroxime Axetil]     HOME MEDICATIONS: Outpatient Prescriptions Prior to Visit  Medication Sig Dispense Refill  . albuterol (PROVENTIL HFA;VENTOLIN HFA) 108 (90 BASE) MCG/ACT inhaler Inhale 1 puff into the lungs every 6 (six) hours as needed for wheezing or shortness of breath.    . DULoxetine (CYMBALTA) 20 MG capsule Take 1 capsule (20 mg total) by mouth at bedtime. 30 capsule 11  . fluticasone (FLONASE) 50 MCG/ACT nasal spray Place 1 spray into both nostrils daily.    . ondansetron (ZOFRAN) 4 MG tablet Take 1 tablet (4 mg total) by mouth every 8 (eight) hours as needed for nausea or vomiting. 30 tablet 3   No facility-administered medications prior to visit.    PAST MEDICAL HISTORY: Past Medical History  Diagnosis Date  . Hearing loss   . Headache   . Asthma   . Vertigo   . Bilateral headaches 01/27/2015    PAST SURGICAL HISTORY: Past Surgical History  Procedure Laterality Date  . Sinus endo w/fusion      FAMILY HISTORY: Family History  Problem Relation Age of Onset  . Cancer Father     throat   . Cancer Paternal Grandfather   . Migraines Mother     SOCIAL HISTORY: Social History   Social History  . Marital Status: Married    Spouse Name: [name]  . Number of Children: 1  . Years of Education: [employer]   Occupational History  . Not on file.   Social History Main Topics  . Smoking status: Former Smoker    Quit date: 04/05/2010  . Smokeless tobacco: Never Used  . Alcohol Use: 0.0 oz/week    0 Standard drinks or equivalent per week     Comment: social  . Drug Use: No  . Sexual Activity:    Partners: Female   Other Topics Concern  . Not on file   Social History Narrative   Patient lives at home with his wife [name]).   Patient is in the [employer]    Right handed.   Caffeine sweet tea once daily.       PHYSICAL EXAM  Filed Vitals:   08/27/15 0924  BP: 110/78  Pulse: 72  Height: 5\' 11"  (1.803 m)  Weight: 176 lb (79.833 kg)   Body mass index is 24.56 kg/(m^2).  Generalized: Well developed, in no acute distress   Neurological examination  Mentation: Alert oriented to time, place, history taking. Follows all commands speech and language fluent Cranial nerve II-XII: Pupils were equal round reactive to light. Extraocular movements were full, visual field were full on confrontational test. Facial sensation and strength were normal. Uvula tongue midline. Head turning and shoulder shrug  were normal and symmetric. Motor: The motor testing reveals 5 over 5 strength of all 4 extremities. Good symmetric motor tone is noted throughout.  Sensory: Sensory testing is intact to soft touch on all 4 extremities. No evidence  of extinction is noted.  Coordination: Cerebellar testing reveals good finger-nose-finger and heel-to-shin bilaterally.  Gait and station: Gait is normal. Tandem gait is normal. Romberg is negative. No drift is seen.  Reflexes: Deep tendon reflexes are symmetric and normal bilaterally.   DIAGNOSTIC DATA (LABS, IMAGING, TESTING) - I reviewed patient records, labs, notes, testing and imaging myself where available.     ASSESSMENT AND PLAN 25 y.o. year old male  has a past medical history of Hearing loss; Headache; Asthma; Vertigo; and Bilateral headaches (01/27/2015). here with:  1. Headache 2. vertigo  We will increase Cymbalta to 30 mg daily. If the patient does not tolerate increase in the medication we will have to consider another medication such as nortriptyline. The patient continues to have dizziness. I will refer the patient for vestibular rehabilitation. I again explained that he will most likely need more than one session to resolve his vertigo. Patient voices understanding. He will follow-up in 6 months with Dr. Janann Colonel, MSN, NP-C  08/27/2015, 9:32 AM [facility] [address] [place], [place] [address] [phone]    Personally participated in and made any corrections needed to history, physical, neuro exam,assessment and plan as stated above, evaluated lab date, reviewed imaging studies and agree with radiology interpretations.    Sarina Ill, MD [facility]

## 2015-09-03 ENCOUNTER — Ambulatory Visit: Attending: Adult Health | Admitting: Physical Therapy

## 2015-09-03 ENCOUNTER — Encounter: Payer: Self-pay | Admitting: Physical Therapy

## 2015-09-03 DIAGNOSIS — R2689 Other abnormalities of gait and mobility: Secondary | ICD-10-CM | POA: Diagnosis present

## 2015-09-03 DIAGNOSIS — R42 Dizziness and giddiness: Secondary | ICD-10-CM | POA: Insufficient documentation

## 2015-09-03 NOTE — Therapy (Signed)
[place] [address] [place], [place], [address] Phone: [phone]   Fax:  [phone]  Physical Therapy Evaluation  Patient Details  Name: Dustin [name] MRN: [id number] Date of Birth: 01-15-1991 Referring Provider: Rozanna Box, MD  Encounter Date: 09/03/2015      PT End of Session - 09/03/15 1128    Visit Number 1   Number of Visits 5  eval + 4 visits   Date for PT Re-Evaluation 10/03/15   Authorization Type [facility] [employer]   PT Start Time 0850   PT Stop Time 0933   PT Time Calculation (min) 43 min   Activity Tolerance Patient tolerated treatment well   Behavior During Therapy [facility] for tasks assessed/performed      Past Medical History  Diagnosis Date  . Hearing loss   . Headache   . Asthma   . Vertigo   . Bilateral headaches 01/27/2015    Past Surgical History  Procedure Laterality Date  . Sinus endo w/fusion      There were no vitals filed for this visit.       Subjective Assessment - 09/03/15 0855    Subjective Pt with increased dizziness after sinus endo with fusion in 11/2014.  Pt enjoys playing sports, but has difficulty doing so due to random episodes of room-spinning vertigo which do not seem to be precipitated by specific movement or event.    Pertinent History PMH significant for: Sinus endoscopy with fusion (11/2014), asthma   Patient Stated Goals "To get rid of the dizziness"   Currently in Pain? No/denies            [facility] PT Assessment - 09/03/15 0001    Assessment   Medical Diagnosis Vertigo, central, bilateral; headaches   Referring Provider Camen Dohmeier, MD   Onset Date/Surgical Date 11/04/14   Hand Dominance Right   Precautions   Precautions None   Restrictions   Weight Bearing Restrictions No   Balance Screen   Has the patient fallen in the past 6 months No   Has the patient had a decrease in activity level because of a fear of falling?  No   Is the patient  reluctant to leave their home because of a fear of falling?  No   Home Ecologist residence   Living Arrangements Spouse/significant other   Type of [place] to enter   Entrance Stairs-Number of Steps 4   Entrance Stairs-Rails Can reach both   [place] One level   Prior Function   Level of Independence Independent   Vocation Full time employment   Vocation Requirements in [place] to play sports, especially basketball   Cognition   Overall Cognitive Status Within Functional Limits for tasks assessed            Vestibular Assessment - 09/03/15 0001    Vestibular Assessment   General Observation Describes both vertical and horizontal vertical vertigo, sometimes in vertical direction and in horizontal directions.    Symptom Behavior   Type of Dizziness Spinning   Frequency of Dizziness daily   Duration of Dizziness "anywhere between 2 minutes and all day"   Aggravating Factors No known aggravating factors   Relieving Factors Comments  "to wait.Marland Kitchenthere's nothing I can do to make it better."   Occulomotor Exam   Occulomotor Alignment Normal   Spontaneous Absent   Gaze-induced Absent   Head shaking Horizontal  Absent  using Frenzels   Head Shaking Vertical Absent  using Frenzels   Smooth Pursuits Intact   Saccades Intact   Comment L Head Thrust Test (+) for corrective saccade but asymptomatic.   Vestibulo-Occular Reflex   VOR 1 Head Only (x 1 viewing) Pt reports mild blurring of visual target during VOR with horizontal head turns. Report no difficulty with vertical head turns but does tend to maintain slower speed of head movement.   VOR Cancellation Normal   Comment Convergence appears WNL.   Positional Testing   Dix-Hallpike Dix-Hallpike Right;Dix-Hallpike Left   Horizontal Canal Testing Horizontal Canal Right;Horizontal Canal Left   Dix-Hallpike Right   Dix-Hallpike Right Duration NA   Dix-Hallpike  Right Symptoms No nystagmus   Dix-Hallpike Left   Dix-Hallpike Left Duration NA   Dix-Hallpike Left Symptoms No nystagmus   Horizontal Canal Right   Horizontal Canal Right Duration NA   Horizontal Canal Right Symptoms Normal   Horizontal Canal Left   Horizontal Canal Left Duration NA   Horizontal Canal Left Symptoms Normal               [facility] Adult PT Treatment/Exercise - 09/03/15 0001    Transfers   Transfers Sit to Stand;Stand to Sit   Sit to Stand 7: Independent   Stand to Sit 7: Independent   Ambulation/Gait   Ambulation/Gait Yes   Ambulation/Gait Assistance 7: Independent   Ambulation Distance (Feet) 400 Feet   Assistive device None   Gait Pattern Step-through pattern  mild staggering during gait w/ vertical head turns         Vestibular Treatment/Exercise - 09/03/15 0001    Vestibular Treatment/Exercise   Vestibular Treatment Provided Gaze   Gaze Exercises X1 Viewing Horizontal;X1 Viewing Vertical   X1 Viewing Horizontal   Foot Position Standing with narrow BOS   Reps 2   Comments 2 x30-sec trials with cueing for technique   X1 Viewing Vertical   Foot Position Standing with narrow BOS   Reps 2   Comments 2 x30-sec trials with cueing for technique            Balance Exercises - 09/03/15 1127    Balance Exercises: Standing   Standing Eyes Closed Narrow base of support (BOS);Head turns;Foam/compliant surface;Other reps (comment)  2 pillows; horiz, vert, and diagonal turns x10 each           PT Education - 09/03/15 1123    Education provided Yes   Education Details PT findings, goals, and POC. HEP initiated.   Person(s) Educated Patient   Methods Explanation;Demonstration;Handout;Verbal cues   Comprehension Verbalized understanding;Returned demonstration          PT Short Term Goals - 09/03/15 1139    PT SHORT TERM GOAL #1   Title STG's = LTG's           PT Long Term Goals - 09/03/15 1139    PT LONG TERM GOAL #1   Title Pt will  perform HEP independently to maximize functional gains made in PT.  (Target date: 10/01/15)   Status New   PT LONG TERM GOAL #2   Title Pt will improve DHI score from 28 to </= 10 to indicate significant decrease in pt-perceived disability due to dizziness.    Status New   PT LONG TERM GOAL #3   Title Perform SOT and establish appropriate goal, if indicated.    Status New  Plan - 09/03/15 1130    Clinical Impression Statement Pt is a 25 y/o M referred to outpatient PT to address recurrent episodes of vertigo since sinus surgery in 11/2014. PMH significant for: sinus endoscopy with fusion (11/2014), asthma. Unable to reproduce concordant room-spinning vertigo on PT evaluation; however, eval did reveal positive L Head Thrust Test, decreased postural stability with vestibular reliance, and increased disequilibrium/decrease stability during gait with vertical head turns. Pt will benefit from skilled outpatient PT1x/week for 4 weeks to address said impairments.    Rehab Potential Good   Clinical Impairments Affecting Rehab Potential Positive: age. Negative: inbility to reproduce concordant vertigo on PT evaluation.   PT Frequency 1x / week   PT Duration 4 weeks   PT Treatment/Interventions Canalith Repostioning;Vestibular;Gait training;Neuromuscular re-education;Patient/family education;Therapeutic activities;Functional mobility training;Therapeutic exercise;Balance training   PT Next Visit Plan Perform SOT and write appropriate goal.  Assess current HEP and progress prn.   PT Home Exercise Plan See Pt Instructions from 5/31.   Consulted and Agree with Plan of Care Patient      Patient will benefit from skilled therapeutic intervention in order to improve the following deficits and impairments:  Abnormal gait, Dizziness, Decreased balance  Visit Diagnosis: Dizziness and giddiness - Plan: PT plan of care cert/re-cert  Other abnormalities of gait and mobility - Plan: PT plan of  care cert/re-cert     Problem List Patient Active Problem List   Diagnosis Date Noted  . Bilateral headaches 01/27/2015  . Vertigo, central 01/27/2015  . Vertigo of central origin 09/10/2014  . Dizziness and giddiness 09/10/2014  . Episodic recurrent vertigo 08/12/2014    Billie Ruddy, PT, DPT [facility] [address] [place], [place], [address] Phone: [phone]   Fax:  [phone] 09/03/2015, 11:45 AM  Name: DANNEL HARALDSON MRN: [id number] Date of Birth: 1990/08/05

## 2015-09-03 NOTE — Patient Instructions (Signed)
Feet Together (Compliant Surface) Head Motion - Eyes Closed    Stand on 2 pillows or a couch cushion with feet together. Close eyes and move head slowly: up and down 10 times; right to left 10 times; diagonally from up-right to down-left 10 times; and diagonally up-left to down-right 10 times. Repeat _2__ times per day.  Gaze Stabilization: Standing Feet Together    Feet together, keeping eyes on target ("A") on wall _3___ feet away, tilt head down slightly and move head side to side for __30__ seconds. Repeat while moving head up and down for __30__ seconds.  Make sure target remains in focus on stationary. If target gets blurry or begins moving, slow head movement down. Do _2___ sessions per day.  Walking Head Turn    Standing close to a wall, walk while nodding head up and down (as if nodding head "yes"). Touch wall if necessary to keep balance. Practice for at least 3-5 minutes day.

## 2015-09-10 ENCOUNTER — Encounter: Admitting: Physical Therapy

## 2015-09-17 ENCOUNTER — Encounter: Admitting: Physical Therapy

## 2015-09-24 ENCOUNTER — Encounter: Admitting: Physical Therapy

## 2015-10-01 ENCOUNTER — Encounter: Admitting: Physical Therapy

## 2015-10-28 ENCOUNTER — Ambulatory Visit (INDEPENDENT_AMBULATORY_CARE_PROVIDER_SITE_OTHER): Admitting: Neurology

## 2015-10-28 ENCOUNTER — Encounter: Payer: Self-pay | Admitting: Neurology

## 2015-10-28 VITALS — BP 110/80 | HR 64 | Resp 20 | Ht 71.0 in | Wt 175.0 lb

## 2015-10-28 DIAGNOSIS — R040 Epistaxis: Secondary | ICD-10-CM

## 2015-10-28 DIAGNOSIS — H8143 Vertigo of central origin, bilateral: Secondary | ICD-10-CM

## 2015-10-28 DIAGNOSIS — R519 Headache, unspecified: Secondary | ICD-10-CM

## 2015-10-28 DIAGNOSIS — R51 Headache: Secondary | ICD-10-CM | POA: Diagnosis not present

## 2015-10-28 MED ORDER — LORATADINE 10 MG PO TABS: 10.0000 mg | ORAL_TABLET | Freq: Every day | ORAL | 5 refills | Status: DC

## 2015-10-28 MED ORDER — DIAZEPAM 5 MG PO TABS: 5.0000 mg | ORAL_TABLET | Freq: Four times a day (QID) | ORAL | 0 refills | Status: DC | PRN

## 2015-10-28 MED ORDER — DULOXETINE HCL 40 MG PO CPEP: ORAL_CAPSULE | ORAL | 3 refills | Status: DC

## 2015-10-28 NOTE — Patient Instructions (Signed)

## 2015-10-28 NOTE — Progress Notes (Signed)
PATIENT: Dustin [name] DOB: [birth year]  REASON FOR VISIT: follow up- dizziness HISTORY FROM: patient  HISTORY OF PRESENT ILLNESS: Dustin [name] is a 25 year old male with a history of dizziness. He returns today for an evaluation. He states that recently he was standing in formation for [employer]. He states he began to have a headache and then dizziness pursued. The next thing he remembers is waking up on the ground. He states that he was told he passed out. He was taken to the hospital in [place]. He states they completed a CT of the head that was normal. These results are not available to me. When he passed out there was no seizure activity noted. Denies any shaking or convulsing in the extremities. Denies loss of bowels or bladder. Denies biting his tongue or gums. The patient states that he's been having a "migraine" daily since the event. Before that he was having a present one headache a week. The patient describes his headache as being "all over." He denies any photophobia and phonophobia. He states that he will occasionally have some nausea but denies vomiting. The patient has been on Valium but did not notice benefit. He states that he stopped this medication approximately 4 days ago. The patient also states that he cannot stand  loud noises. He states this occurs at all times -not just with a headache. For example he does not like to hear a dog bark or baby cry.  The patient's mother is with him today.  She states that 20 years ago she was having the same symptoms. She was diagnosed with serotonin deficiency. She was started on Cymbalta and her symptoms resolved. MM, Sept 2016.  Interval history from 01-27-15, Dustin [name], a 25 year old Caucasian married gentleman is here today for follow-up visit. He has responded very well to the Cymbalta medication which has helped him to eliminate headaches. Unfortunately it has not affected his dizziness or vertigo sensation. He reports daily  lightheadedness feeling all day long waxing and waning but being almost never symptom-free. This has become a chronic condition for him and he is frustrated. The onset of vertigo and dizziness sensations was correlated to his sinus surgery.   HISTORY 10/24/14 [facility]): Dustin [name] is a [age]-year-old Caucasian tended male who presents for an evaluation of vertigo. His wife are here together today reporting that Dustin [name] underwent a sinus surgery in September 2015 within days after the sinus surgery -his sinus problems resolved but he begun experiencing vertigo.  An MRI was obtained the months following to surgery which showed no acute brain abnormalities it was done with and without contrast. There was still some sinus mucosal thickening noted. He also has a history of asthma and is taking antiallergy medicines and nasal spray. He describes his dizziness as a spinning sensation to the right felt that his surrounding spun around him and not that he was in motion spells occurred without any noticeable [name]. At times he would just watch TV or drive and was not aware of a rapid head movement or positional change bring it on. Some spells lasted a couple of minutes for other more severe one lasted a couple of hours. He usually has a spell once or several times a day he reports that he has never been woken out of sleep by a vertigo spell or that's his sleep is affected in any way by this. When he wakes up in the morning he does not wake up first feeling dizzy.  He has some headaches but these seem to be unrelated to the vertigo spells. He did have some nausea with some of the vertigo spells but no diplopia he feels dizzy and lightheaded at times almost as if passing out but has never so far lost awareness or consciousness. 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  He had tried meclizine and his primary care  first which did not help he had seen Pleas Koch, D.O. who prescribed topiramate concerning that this may be a migraine equivalent. This did not work. 1. Headache - proved on Cymbalta 2. Syncopal event no new syncope since June 2016 3. Dizziness-continues, throughout the day daily. There is still a vertical component but no associated nystagmus. I will ask my colleagues in vestibular rehabilitation to evaluate Dustin [name]. Unfortunately the vertigo was not suppressed by Valium. He is currently on no vertigo suppressant medication. He developed this vertigo after anesthesia and sinus surgery, felt "never right ".  Ward Givens NP started the patient in September on a low dose of Cymbalta 30 mg daily and this helped with his headaches.  I suggest that the patient make an appointment with his primary care for evaluation of syncope.  A cardiac evaluation was completed the patient underwent cardiac monitoring and an echocardiogram. Both were reported as normal . The patient has had a CT scan of the brain that was normal per the patient. In the past he's had TCD that was normal- interpreted on 09-26-14 by Dr. Antony Contras .   I have not found a way to treat his vertigo and defer to vestibular rehabilitation.  RV prn , Cymbalta refills. 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   Interval history from 10/28/2015, at the pleasure of seeing Dustin [name] today. Dustin [name] has been doing well for almost a year with less headaches and less dizzy spells but recently had some flareup. He has experienced increasing headaches -but over the last week increasing vertigo sensations and last night woke up with a nosebleed and a feeling that he may pass out. He did not pass out however. The degree of dizziness has been so bad that he couldn't even get to sleep several  nights ago last Thursday last Friday where his worst days so far. He is continued to take the Cymbalta as prescribed which has helped with his headaches in the past. He had undergone a completely normal workup for cardiac causes of passing out , and had a normal CT brain/ TCD Dr Leonie Man.   His dizziness is not associated with nausea, diplopia, he had some body shaking but not necessarily cardiac palpitations and he was not diaphoretic. The nosebleed however is bothersome of course, and this seems to be more of these dizzy spells in Summer. However he has a long-standing history of respiratory allergies and has has been able to control them well with year around allergie medication. He has a nose bleed once a week. He should see ENT again to evaluate the epistaxis.   REVIEW OF SYSTEMS: Out of a complete 14 system review of symptoms, the patient complains only of the following symptoms, and all other reviewed systems    Severe vertigo, epistaxis and headche. I will take Dustin [name] out of  work for the next 8 days to give him opportunity to do some home exercises for vestibulitis, he still has the sheets from his last vestibular rehabilitation. I asked him to take it daily his over-the-counter form of Claritin, I added the Claritin today who to his medication list, I will offer him a week of Valium at night to suppress the vestibular [name]. I increased Cymbalta from 30-40 mg I do not expect a side effect from a rather gentle increase but it is probably to his benefit that he starts it now before he has to return to work.   ALLERGIES: Allergies  Allergen Reactions  . Penicillins Hives  . Tetracyclines & Related Hives  . Biaxin [Clarithromycin]   . Ceftin [Cefuroxime Axetil]     HOME MEDICATIONS: Outpatient Medications Prior to Visit  Medication Sig Dispense Refill  . albuterol (PROVENTIL HFA;VENTOLIN HFA) 108 (90 BASE) MCG/ACT inhaler Inhale 1 puff into the lungs every 6 (six) hours as needed for  wheezing or shortness of breath.    . DULoxetine (CYMBALTA) 30 MG capsule Take 1 capsule (30 mg total) by mouth at bedtime. 30 capsule 3  . fluticasone (FLONASE) 50 MCG/ACT nasal spray Place 1 spray into both nostrils daily.     No facility-administered medications prior to visit.     PAST MEDICAL HISTORY: Past Medical History:  Diagnosis Date  . Asthma   . Bilateral headaches 01/27/2015  . Headache   . Hearing loss   . Vertigo     PAST SURGICAL HISTORY: Past Surgical History:  Procedure Laterality Date  . SINUS ENDO W/FUSION      FAMILY HISTORY: Family History  Problem Relation Age of Onset  . Cancer Father     throat   . Cancer Paternal Grandfather   . Migraines Mother     SOCIAL HISTORY: Social History   Social History  . Marital status: Married    Spouse name: [name]  . Number of children: 1  . Years of education: [employer]   Occupational History  . Not on file.   Social History Main Topics  . Smoking status: Former Smoker    Quit date: 04/05/2010  . Smokeless tobacco: Never Used  . Alcohol use 0.0 oz/week     Comment: social  . Drug use: No  . Sexual activity: Yes    Partners: Female   Other Topics Concern  . Not on file   Social History Narrative   Patient lives at home with his wife [name]).   Patient is in the [employer]    Right handed.   Caffeine sweet tea once daily.      PHYSICAL EXAM     There is no height or weight on file to calculate BMI.  Generalized: Well developed, in no acute distress . Well groomed, compliant and cooperative.  He has ongoing nasal congestion, but much improved since surgery. There are some ciliary injection in both eyes.  Neurological examination  Mentation: Alert oriented to time, place, history taking. Follows all commands speech and language fluent Cranial nerve: No change in smell or taste. Pupils were equal round reactive to light. Extraocular movements were full, visual field were full on  confrontational test.  When I move the patient's head but his eyes were closed he did not experience a significant degree of dizziness per se.  There was no nystagmus noted even after rotation and forward backward flexion. He was able to describe his symptoms better when he closed his eyes  again and he stated that he felt a  clockwise rotation. Again no nystagmus is noted. Facial sensation and strength were normal. Uvula and ongue midline. Head turning and shoulder shrug  were  symmetric. Motor: 5 / 5 strength of all  extremities. Good symmetric motor tone is noted throughout.  Sensory: Sensory testing is intact to soft touch on all extremities. Coordination: Cerebellar testing reveals good finger-nose-finger bilaterally. No tremor, ataxia or dysmetria.  Gait and station: Gait is stable and normal. Tandem gait is normal.   Romberg is negative. No drift is seen.  Reflexes: Deep tendon reflexes are symmetric and normal bilaterally.   DIAGNOSTIC DATA (LABS, IMAGING, TESTING) - I reviewed patient records, labs, notes, testing and imaging myself where available. Ct head was reviewed with the images displayed and  discussed on my lab top.   ASSESSMENT AND PLAN 25 y.o. year old male  has a past medical history of Asthma; Bilateral headaches (01/27/2015); Headache; Hearing loss; and Vertigo. here with:  Return to epstaxis and dizziness, works as a Administrator.  I will increase Cymbalta  to 45 mg, resume vestibular rehab and referred again to ENT, [facility] . Dr Janace Hoard.  Continue claritin use year around.  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 Goes to bed at 1 AM at the earliest.  Has been taken out of work for 8 days, beginning today.    10/28/2015, 1:07 PM [facility] [address], [place], [place] [address] [phone]     [facility]

## 2015-10-28 NOTE — Addendum Note (Signed)
Addended by: Larey Seat on: 10/28/2015 01:36 PM   Modules accepted: Orders

## 2015-11-11 ENCOUNTER — Ambulatory Visit: Attending: Adult Health | Admitting: Physical Therapy

## 2015-11-11 DIAGNOSIS — R2689 Other abnormalities of gait and mobility: Secondary | ICD-10-CM | POA: Insufficient documentation

## 2015-11-11 DIAGNOSIS — R42 Dizziness and giddiness: Secondary | ICD-10-CM | POA: Diagnosis present

## 2015-11-11 NOTE — Therapy (Signed)
[facility] [address] [place], [place], [address] Phone: [phone]   Fax:  [phone]  Physical Therapy Treatment  Patient Details  Name: Dustin [name] MRN: [id number] Date of Birth: 02-16-1991 Referring Provider: Rozanna Box, MD  Encounter Date: 11/11/2015      PT End of Session - 11/11/15 1305    Visit Number 2   Number of Visits 5   Date for PT Re-Evaluation 12/11/15   Authorization Type [employer]   PT Start Time 0803   PT Stop Time 0846   PT Time Calculation (min) 43 min   Equipment Utilized During Treatment Other (comment)  Balance Master and harness   Activity Tolerance Other (comment)  Limited by nausea/vomiting   Behavior During Therapy [facility] for tasks assessed/performed      Past Medical History:  Diagnosis Date  . Asthma   . Bilateral headaches 01/27/2015  . Headache   . Hearing loss   . Vertigo     Past Surgical History:  Procedure Laterality Date  . SINUS ENDO W/FUSION      There were no vitals filed for this visit.      Subjective Assessment - 11/11/15 0804    Subjective Pt returns today after awaiting [facility] approval for PT. Since PT evaluation, pt states. "vertigo has returned," per pt. Describes dizziness as room-spinning vertigo; onset is spontaneous, and constant. Vertigo is present at this time; occurs for hours and at rest (no head/body movement).  "Since I seen Dohmeier a week or so ago, it's been happening pretty consistently...daily. It was bad enough for her to prescribe Valium and take me out of work for a week."    Pertinent History PMH significant for: Sinus endoscopy with fusion (11/2014), asthma   Diagnostic tests Recent CT scan of head (after syncopal episode)wwas unremarkable.    Patient Stated Goals "To get rid of the dizziness"   Currently in Pain? No/denies                Vestibular Assessment - 11/11/15 0001      Positional Testing   Dix-Hallpike  Dix-Hallpike Right;Dix-Hallpike Left   Horizontal Canal Testing Horizontal Canal Right;Horizontal Canal Left     Dix-Hallpike Right   Dix-Hallpike Right Duration NA   Dix-Hallpike Right Symptoms No nystagmus     Dix-Hallpike Left   Dix-Hallpike Left Duration NA   Dix-Hallpike Left Symptoms No nystagmus     Horizontal Canal Right   Horizontal Canal Right Duration NA   Horizontal Canal Right Symptoms Normal     Horizontal Canal Left   Horizontal Canal Left Duration NA   Horizontal Canal Left Symptoms Normal     Equities trader Comment Composite score = 79% compared to age/height normative value of 70%. Sensory analysis reveals pt ability to use visual, vestibular, and somatosensory inputs for balance are all above WNL for age/height.  No "falls" sustained. Strategy Analysis WNL. COG alignment grossly midline.      Orthostatics   BP supine (x 5 minutes) 106/67   HR supine (x 5 minutes) 66   BP standing (after 1 minute) 118/76   HR standing (after 1 minute) 79            BP HR Symptoms     Supine x5 min 106/67 66      Standing x1 min  118/76 79      Standing x3 min 116/76 88  Standing x5 min 101/74 91      Standing x8 min  102 "Starting to feel a little worse," per pt.  Standing x10 min 132/106 53 Vomiting                      PT Education - 11/11/15 1301    Education provided Yes   Education Details Explained SOT findings and vital sign findings. Educated pt that this PT will contact referring MD, Dr. Brett Fairy, about findings during this session.   Person(s) Educated Patient   Methods Explanation   Comprehension Verbalized understanding          PT Short Term Goals - 09/03/15 1139      PT SHORT TERM GOAL #1   Title STG's = LTG's           PT Long Term Goals - 11/11/15 1306      PT LONG TERM GOAL #1   Title Pt will perform HEP independently to maximize functional gains made in PT.   (Modified target date: 12/09/15)   Status On-going     PT LONG TERM GOAL #2   Title Pt will improve DHI score from 28 to </= 10 to indicate significant decrease in pt-perceived disability due to dizziness.   (Modified target date: 12/09/15)   Baseline --   Status On-going     PT LONG TERM GOAL #3   Title Perform SOT and establish appropriate goal, if indicated.    Baseline 8/8: SOT composite score above WNL for age/height.   Status Achieved               Plan - 11/11/15 1308    Clinical Impression Statement Session focused on continuing to assess/address origin of patient's dizziness. Pt reports constant dizziness at rest which does not change wit exertion or head movement. SOT reveals pt ability to utilize visual, vestibular, and somatosensory inputs for balance are WNL for age. Therefore, performed orthostatic vital signs assessment x10 minutes after standing from supine position. Noted significant flucuation in HR and BP accompanied by nausea and vomiting 10 minutes after standing from supine position. See note for detailed vital signs. Will notidy referring MD of findings.    Rehab Potential Good   Clinical Impairments Affecting Rehab Potential Positive: age. Negative: inbility to reproduce concordant vertigo on PT evaluation.   PT Frequency 1x / week   PT Duration 4 weeks   PT Treatment/Interventions Canalith Repostioning;Vestibular;Gait training;Neuromuscular re-education;Patient/family education;Therapeutic activities;Functional mobility training;Therapeutic exercise;Balance training   Consulted and Agree with Plan of Care Patient      Patient will benefit from skilled therapeutic intervention in order to improve the following deficits and impairments:     Visit Diagnosis: Dizziness and giddiness - Plan: PT PLAN OF CARE CERT/RE-CERT  Other abnormalities of gait and mobility     Problem List Patient Active Problem List   Diagnosis Date Noted  . Epistaxis 10/28/2015  .  Bilateral headaches 01/27/2015  . Vertigo, central 01/27/2015  . Vertigo of central origin 09/10/2014  . Dizziness and giddiness 09/10/2014  . Episodic recurrent vertigo 08/12/2014    Billie Ruddy, PT, DPT [facility] [address] [place], [place], [address] Phone: [phone]   Fax:  [phone] 11/11/15, 3:19 PM  Name: Dustin [name] MRN: [id number] Date of Birth: 14-Sep-1990

## 2015-11-11 NOTE — Progress Notes (Signed)
I agree with the assessment and plan as directed by PT-.The patient is known to me .   DOHMEIER,CARMEN, MD  

## 2015-11-17 ENCOUNTER — Ambulatory Visit: Admitting: Physical Therapy

## 2015-11-17 DIAGNOSIS — R42 Dizziness and giddiness: Secondary | ICD-10-CM | POA: Diagnosis not present

## 2015-11-17 NOTE — Therapy (Signed)
[facility] [address] [place], [place], [address] Phone: [phone]   Fax:  [phone]  Physical Therapy Treatment and Discharge Summary  Patient Details  Name: Dustin [name] MRN: [id number] Date of Birth: [birth year] Referring Provider: Larey Seat, MD  Encounter Date: 11/17/2015      PT End of Session - 11/17/15 1732    Visit Number 3   Number of Visits 5   Date for PT Re-Evaluation 12/11/15   Authorization Type [employer]   PT Start Time 1310  late start due to error with patient check-in   PT Stop Time 1330   PT Time Calculation (min) 20 min   Activity Tolerance Patient tolerated treatment well   Behavior During Therapy [facility] for tasks assessed/performed      Past Medical History:  Diagnosis Date  . Asthma   . Bilateral headaches 01/27/2015  . Headache   . Hearing loss   . Vertigo     Past Surgical History:  Procedure Laterality Date  . SINUS ENDO W/FUSION      There were no vitals filed for this visit.      Subjective Assessment - 11/17/15 1523    Subjective "I know that when I'm sitting or lyng down, I don't normally feel as bad as when I'm standing. Today, I was wearing my wife's fit bit and my heart rate was jumping around just sitting there...it was 115 at rest."   Pertinent History PMH significant for: Sinus endoscopy with fusion (11/2014), asthma   Diagnostic tests Recent CT scan of head (after syncopal episode)wwas unremarkable.    Patient Stated Goals "To get rid of the dizziness"   Currently in Pain? No/denies                Vestibular Assessment - 11/17/15 0001      Symptom Behavior   Type of Dizziness Lightheadedness   Frequency of Dizziness daily   Duration of Dizziness constant; all day   Aggravating Factors Supine to sit;Comment  standing   Relieving Factors Comments  sitting; lying down     Occulomotor Exam   Comment B Head Thrust Test (-) and asymptomatic      Visual Acuity   Static line 10   Dynamic line 8  WNL                         PT Education - 11/17/15 1737    Education provided Yes   Education Details Educated pt on DC plan due to dizziness that does not appear to be of vestibular origin.    Person(s) Educated Patient   Methods Explanation   Comprehension Verbalized understanding          PT Short Term Goals - 09/03/15 1139      PT SHORT TERM GOAL #1   Title STG's = LTG's           PT Long Term Goals - 11/17/15 1736      PT LONG TERM GOAL #1   Title Pt will perform HEP independently to maximize functional gains made in PT.  (Modified target date: 12/09/15)   Status Achieved     PT LONG TERM GOAL #2   Title Pt will improve DHI score from 28 to </= 10 to indicate significant decrease in pt-perceived disability due to dizziness.   (Modified target date: 12/09/15)   Baseline 8/14: [place] = 36.   Status Not Met  PT LONG TERM GOAL #3   Title Perform SOT and establish appropriate goal, if indicated.    Baseline 8/8: SOT composite score above WNL for age/height.   Status Achieved               Plan - 11/17/15 1733    Clinical Impression Statement Pt continues to report dizziness at rest which does not change with head movement. Dizziness improves with supine/seated position and is worsened with prolonged standing. Dynamic Visual Acuity and Head Thust Tests both WNL today. Pt's dizziness does not seem to be originating from vestibular system, and pt will therefore be discharged from vestibular PT at this time. Pt in full agreement.    Consulted and Agree with Plan of Care Patient      Patient will benefit from skilled therapeutic intervention in order to improve the following deficits and impairments:     Visit Diagnosis: Dizziness and giddiness     Problem List Patient Active Problem List   Diagnosis Date Noted  . Epistaxis 10/28/2015  . Bilateral headaches 01/27/2015  . Vertigo,  central 01/27/2015  . Vertigo of central origin 09/10/2014  . Dizziness and giddiness 09/10/2014  . Episodic recurrent vertigo 08/12/2014   PHYSICAL THERAPY DISCHARGE SUMMARY  Visits from Start of Care: 3  Current functional level related to goals / functional outcomes: See above.   Remaining deficits: Dizziness, lightheadedness at rest which improves with seated, supine positions.   Education / Equipment: See above.  Plan: Patient agrees to discharge.  Patient goals were not met. Patient is being discharged due to                                                     ????patient presentation suggests no further need for skilled vestibular PT.          Billie Ruddy, PT, DPT [facility] [address] [place], [place], [address] Phone: [phone]   Fax:  [phone] 11/17/15, 5:39 PM  Name: Dustin [name] MRN: [id number] Date of Birth: [birth year]

## 2015-11-18 NOTE — Progress Notes (Signed)
I agree with the assessment and plan as directed by PT-.The patient is known to me .   DOHMEIER,CARMEN, MD  

## 2015-11-21 ENCOUNTER — Telehealth: Payer: Self-pay | Admitting: Neurology

## 2015-11-21 DIAGNOSIS — H8143 Vertigo of central origin, bilateral: Secondary | ICD-10-CM

## 2015-11-21 NOTE — Telephone Encounter (Signed)
I believe they do tilt table tests at a cardiologist's office.  Thanks so much for following up on this.

## 2015-11-21 NOTE — Telephone Encounter (Signed)
-----   Message from Stefano Gaul, PT sent at 11/17/2015  9:14 AM EDT ----- Regarding: Patient presentation Dr. Brett Fairy,  So sorry to bother you about this again, but I'm scheduled to see this Mr. Mckinstry today.   Christina and I have been trying to learn more about POTS and dysautonomia, as we've been treating more patients with these diagnoses lately.   Mr. Benevento symptoms sounded so similar to those of dysautonomia (constant dizziness that doesn't change with head movement, symptoms somewhat worse in standing, and recent syncopal episode) that I assessed orthostatic vital signs.   Given his fluctuation in vital signs and response to transition from sitting to standing (vomiting, feeling like he was going to pass out), I'm thinking the patient's symptoms are not originating from his vestibular system.  Please let me know if you have any thoughts.  Thanks so much, Billie Ruddy, PT

## 2015-11-21 NOTE — Telephone Encounter (Signed)
Dustin [name], PT  Dustin Seat, MD        Dr. Brett Fairy,   So sorry to bother you about this again, but I'm scheduled to see this Dustin [name] today.   Christina and I have been trying to learn more about POTS and dysautonomia, as we've been treating more patients with these diagnoses lately.   Dustin [name] symptoms sounded so similar to those of dysautonomia (constant dizziness that doesn't change with head movement, symptoms somewhat worse in standing, and recent syncopal episode) that I assessed orthostatic vital signs.   Given his fluctuation in vital signs and response to transition from sitting to standing (vomiting, feeling like he was going to pass out), I'm thinking the patient's symptoms are not originating from his vestibular system.   Please let me know if you have any thoughts.   Thanks so much,  Dustin [name], PT    Dear Dustin [name], I will consider a referral for tilt table test , not sure who does them ? Dustin [name].

## 2015-11-24 ENCOUNTER — Encounter: Admitting: Physical Therapy

## 2015-11-24 NOTE — Telephone Encounter (Signed)
Kristen, Please investigate a possible tilt table test at local cardiology, if not possible, use referral to [facility] . Evaluate for POTS

## 2015-11-24 NOTE — Telephone Encounter (Signed)
I called pt to discuss cardiologist referral for tilt table test. No answer, left a message asking him to call me back.

## 2015-11-27 NOTE — Telephone Encounter (Signed)
Pt's wife returned RN's call °

## 2015-11-27 NOTE — Telephone Encounter (Signed)
I spoke to pt's wife, per DPR. I advised her that pt's physical therapist and Dr. Brett Fairy both agree that pt may need a cardiologist referral for a tilt table test. Pt's wife is agreeable to this and said that the physical therapist discussed this with them. I advised her that the cardiologist office will call them to schedule. Pt's wife verbalized understanding.

## 2015-12-01 ENCOUNTER — Encounter: Admitting: Physical Therapy

## 2015-12-31 ENCOUNTER — Encounter: Payer: Self-pay | Admitting: Cardiovascular Disease

## 2015-12-31 ENCOUNTER — Ambulatory Visit (INDEPENDENT_AMBULATORY_CARE_PROVIDER_SITE_OTHER): Admitting: Cardiovascular Disease

## 2015-12-31 ENCOUNTER — Telehealth (HOSPITAL_COMMUNITY): Payer: Self-pay

## 2015-12-31 VITALS — BP 126/74 | HR 85 | Ht 71.0 in | Wt 185.2 lb

## 2015-12-31 DIAGNOSIS — R000 Tachycardia, unspecified: Secondary | ICD-10-CM | POA: Diagnosis not present

## 2015-12-31 DIAGNOSIS — R072 Precordial pain: Secondary | ICD-10-CM

## 2015-12-31 DIAGNOSIS — R42 Dizziness and giddiness: Secondary | ICD-10-CM | POA: Diagnosis not present

## 2015-12-31 DIAGNOSIS — I498 Other specified cardiac arrhythmias: Secondary | ICD-10-CM

## 2015-12-31 DIAGNOSIS — I951 Orthostatic hypotension: Secondary | ICD-10-CM

## 2015-12-31 DIAGNOSIS — G90A Postural orthostatic tachycardia syndrome (POTS): Secondary | ICD-10-CM

## 2015-12-31 MED ORDER — FLUDROCORTISONE ACETATE 0.1 MG PO TABS: 0.1000 mg | ORAL_TABLET | Freq: Every day | ORAL | 6 refills | Status: DC

## 2015-12-31 NOTE — Patient Instructions (Addendum)
Your physician recommends that you schedule a follow-up appointment in: 6 weeks.   Schedule Stress Myoview     Start Florinef 0.1 mg daily in 2 weeks if needed.

## 2015-12-31 NOTE — Telephone Encounter (Signed)
Encounter complete. 

## 2015-12-31 NOTE — Progress Notes (Signed)
Cardiology Office Note   Date:  12/31/2015   ID:  Dustin [name], Dustin [name] 02/23/1991, MRN [id number]  PCP:  Dustin Kroner, MD  Cardiologist:   Dustin Latch, MD   No chief complaint on file.    History of Present Illness: Dustin [name] is a 25 y.o. male with dizziness who presents for and evaluation of dizziness and chest pain.  September 2015 Dustin [name] had an episode of syncope while standing in formation in the [employer].  He developed a headache and then dizziness prior to passing out. He has been standing at attention for approximately 20-30 minutes before the event. Since then he continues to have almost daily dizziness. It occurs both at rest and when standing. After standing for prolonged periods of time he starts to feel dizzy and nauseous and as though he will pass out again. He has not had any recurrent syncope. He has been evaluated by neurology and had a negative head CT.  He also reports being evaluated by a cardiologist near [facility] where he had a normal echo and were 70 event monitor that was abnormal. He did have symptoms while wearing the monitor but it was unrevealing.   Dustin [name] was evaluated by neurology and treated for headache and low serotonin levels with Cymbalta.  This helped the headaches. However, his dizziness persisted.  He was diagnosed with vertigo and underwent vestibular rehab.   while in rehabilitation he was noted to have drastic changes in his heart rate with standing. His heart rate change rapidly from 150-50 within seconds. Therefore, he was referred back to cardiology for a second opinion. He is unsure of the name of the cardiologist saw the first time.  Lately he reports episodes of chest pain. It starts in the right side of his chest and radiates towards the center. The episodes last for up to 2 days at a time. He describes it as 7 out of 10 achy chest pain.  There is no associated shortness of breath, nausea, vomiting, or diaphoresis.  This is  been ongoing for the last year but occurring more frequently recently. It now occurs up to once per week. In the past to happen approximately once per month. He denies exertional symptoms.  He does not exercise much anymore, but does like to use the elliptical for up to 20 minutes at a time. He has no chest pain with exercise but does still have dizziness. He denies orthopnea, PND, or lower extremity edema.   Past Medical History:  Diagnosis Date  . Asthma   . Bilateral headaches 01/27/2015  . Headache   . Hearing loss   . Vertigo     Past Surgical History:  Procedure Laterality Date  . SINUS ENDO W/FUSION       Current Outpatient Prescriptions  Medication Sig Dispense Refill  . albuterol (PROVENTIL HFA;VENTOLIN HFA) 108 (90 BASE) MCG/ACT inhaler Inhale 1 puff into the lungs every 6 (six) hours as needed for wheezing or shortness of breath.    . diazepam (VALIUM) 5 MG tablet Take 1 tablet (5 mg total) by mouth every 6 (six) hours as needed for anxiety. 30 tablet 0  . DULoxetine 40 MG CPEP Take one at night po. (Patient taking differently: Patient takes one by mouth daily at bedtime) 30 capsule 3  . fludrocortisone (FLORINEF) 0.1 MG tablet Take 1 tablet (0.1 mg total) by mouth daily. 30 tablet 6  . fluticasone (FLONASE) 50 MCG/ACT nasal spray Place 1 spray  into both nostrils daily.    Marland Kitchen loratadine (CLARITIN) 10 MG tablet Take 1 tablet (10 mg total) by mouth daily. 30 tablet 5   No current facility-administered medications for this visit.     Allergies:   Penicillins; Tetracyclines & related; Biaxin [clarithromycin]; and Ceftin [cefuroxime axetil]    Social History:  The patient  reports that he quit smoking about 5 years ago. He has never used smokeless tobacco. He reports that he drinks alcohol. He reports that he does not use drugs.   Family History:  The patient's family history includes Cancer in his father and paternal grandfather; Migraines in his mother.    ROS:  Please see  the history of present illness.   Otherwise, review of systems are positive for none.   All other systems are reviewed and negative.    PHYSICAL EXAM: VS:  BP 126/74   Pulse 85   Ht 5\' 11"  (1.803 m)   Wt 185 lb 3.2 oz (84 kg)   BMI 25.83 kg/m  , BMI Body mass index is 25.83 kg/m. GENERAL:  Well appearing HEENT:  Pupils equal round and reactive, fundi not visualized, oral mucosa unremarkable NECK:  No jugular venous distention, waveform within normal limits, carotid upstroke brisk and symmetric, no bruits, no thyromegaly LYMPHATICS:  No cervical adenopathy LUNGS:  Clear to auscultation bilaterally HEART:  RRR.  PMI not displaced or sustained,S1 and S2 within normal limits, no S3, no S4, no clicks, no rubs, no murmurs ABD:  Flat, positive bowel sounds normal in frequency in pitch, no bruits, no rebound, no guarding, no midline pulsatile mass, no hepatomegaly, no splenomegaly EXT:  2 plus pulses throughout, no edema, no cyanosis no clubbing SKIN:  No rashes no nodules NEURO:  Cranial nerves II through XII grossly intact, motor grossly intact throughout PSYCH:  Cognitively intact, oriented to person place and time   EKG:  EKG is ordered today. The ekg ordered today demonstrates sinus rhythm. Rate 67 bpm. It appeared to wave inversion.   Recent Labs: No results found for requested labs within last 8760 hours.    Lipid Panel No results found for: CHOL, TRIG, HDL, CHOLHDL, VLDL, LDLCALC, LDLDIRECT    Wt Readings from Last 3 Encounters:  12/31/15 185 lb 3.2 oz (84 kg)  10/28/15 175 lb (79.4 kg)  08/27/15 176 lb (79.8 kg)      ASSESSMENT AND PLAN:  # Dizziness: Mr. Dustin [name]'s dizziness and presyncope after standing for long periods of time seems to be consistent with POTS.  He has wide swings in his heart rate.  Orthostatic vital signs were checked today and his blood pressure was stable. His heart rate did increase by more than 10 points with positional changes. This does not  explain his dizziness while sitting. I suggested that he increase his fluid intake to include more than water. I recommended that he also drink sports drinks with electrolytes and at least 64 oz daily.   he will also work on increasing his salt intake. If this does not work, he will try Florinef 0.1 mg daily.   If this does not work we will consider propranolol.  # Chest pain: Mr. Garno's chest pain is very atypical. I suspect that it is not ischemic. However he does have some EKG changes in the inferior leads. Therefore, we will obtain an exercise Myoview to further evaluate.    Current medicines are reviewed at length with the patient today.  The patient does not have concerns regarding  medicines.  The following changes have been made:  florinef  Labs/ tests ordered today include:   Orders Placed This Encounter  Procedures  . Myocardial Perfusion Imaging  . EKG 12-Lead     Disposition:   FU with Tiffany C. Oval Linsey, MD, [facility] in 6 weeks    This note was written with the assistance of speech recognition software.  Please excuse any transcriptional errors.  Signed, Tiffany C. Oval Linsey, MD, [facility]  12/31/2015 9:55 AM    [facility]

## 2016-01-02 ENCOUNTER — Ambulatory Visit (HOSPITAL_COMMUNITY)
Admission: RE | Admit: 2016-01-02 | Discharge: 2016-01-02 | Disposition: A | Discharge status: Home / Self Care | Source: Ambulatory Visit | Attending: Cardiology | Admitting: Cardiology

## 2016-01-02 DIAGNOSIS — R072 Precordial pain: Secondary | ICD-10-CM | POA: Insufficient documentation

## 2016-01-02 LAB — MYOCARDIAL PERFUSION IMAGING
Estimated workload: 13 METS
Exercise duration (min): 10 min
Exercise duration (sec): 46 s
LV dias vol: 96 mL (ref 62–150)
LV sys vol: 41 mL
MPHR: 193 {beats}/min
Peak HR: 184 {beats}/min
Percent HR: 95 %
RPE: 17
Rest HR: 59 {beats}/min
TID: 1.02

## 2016-01-02 MED ORDER — TECHNETIUM TC 99M TETROFOSMIN IV KIT
9.8000 | PACK | Freq: Once | INTRAVENOUS | Status: AC | PRN
  Administered 2016-01-02: 10 via INTRAVENOUS
  Filled 2016-01-02: qty 10

## 2016-01-02 MED ORDER — TECHNETIUM TC 99M TETROFOSMIN IV KIT
31.6000 | PACK | Freq: Once | INTRAVENOUS | Status: AC | PRN
  Administered 2016-01-02: 32 via INTRAVENOUS
  Filled 2016-01-02: qty 32

## 2016-01-06 ENCOUNTER — Telehealth: Payer: Self-pay | Admitting: Cardiovascular Disease

## 2016-01-06 NOTE — Telephone Encounter (Signed)
Returned call to patient stress test results given.Stated he wanted to let Dr.[place] know he passed out this past Saturday while he was working.Stated he has noticed a slight headache since.Message sent to [place].

## 2016-01-06 NOTE — Telephone Encounter (Signed)
Recommend trying Florinef as prescribed.

## 2016-01-06 NOTE — Telephone Encounter (Signed)
New message   Pt returning nurse Melinda's call.

## 2016-01-07 NOTE — Telephone Encounter (Signed)
Advised patient, verbalized understanding  

## 2016-01-25 ENCOUNTER — Other Ambulatory Visit: Payer: Self-pay | Admitting: Neurology

## 2016-01-28 ENCOUNTER — Encounter: Payer: Self-pay | Admitting: Adult Health

## 2016-01-28 ENCOUNTER — Ambulatory Visit (INDEPENDENT_AMBULATORY_CARE_PROVIDER_SITE_OTHER): Admitting: Adult Health

## 2016-01-28 VITALS — BP 118/84 | HR 68 | Resp 12 | Ht 71.0 in | Wt 194.5 lb

## 2016-01-28 DIAGNOSIS — R51 Headache: Secondary | ICD-10-CM | POA: Diagnosis not present

## 2016-01-28 DIAGNOSIS — R519 Headache, unspecified: Secondary | ICD-10-CM

## 2016-01-28 DIAGNOSIS — G8929 Other chronic pain: Secondary | ICD-10-CM

## 2016-01-28 DIAGNOSIS — R42 Dizziness and giddiness: Secondary | ICD-10-CM | POA: Diagnosis not present

## 2016-01-28 NOTE — Progress Notes (Signed)
I agree with the assessment and plan as directed by NP .The patient is known to me . I was available for consultation.    DOHMEIER,CARMEN, MD

## 2016-01-28 NOTE — Patient Instructions (Signed)
Continue Cymbalta 40 mg daily Keep a journal to document your dizzy episodes If your symptoms worsen or you develop new symptoms please let us know.

## 2016-01-28 NOTE — Progress Notes (Signed)
PATIENT: Dustin [name] DOB: 02-May-1990  REASON FOR VISIT: follow up- headache, dizziness HISTORY FROM: patient  HISTORY OF PRESENT ILLNESS: Dustin [name] is a 25 year old male with a history of headaches and dizziness. He returns today for follow-up. At the last visit his Cymbalta was increased to 40 mg. He reports this has been beneficial for his headaches. He has approximately 1 headache a week. He states his headaches are located all over. He confirms photophobia but denies nausea and vomiting. He continues to have dizziness. He is followed with cardiology and ENT-- all exams were unremarkable. He has been diagnosed with POTS but his cardiologist does not feel that this is the sole etiology of his dizziness. The patient was sent to vestibular rehabilitation however it was felt that his dizziness was not from a vestibular origin. The patient states that his dizziness is a room spinning sensation. However it can occur without movement of the head when he is at rest. He reports that sometimes his dizzy episodes can last for 30 seconds and as long as the entire day. His wife is with him today. She reports that he has seemed to tolerate these episodes better. He continues to work full-time. He returns today for an evaluation.  HISTORY Dustin [name] is a 25 year old male with a history of headaches and dizziness. He returns today for follow-up. He reports that initially Cymbalta was working well for his headaches and his mood. However now he reports that he typically has a headache every other day. His headache is only located in the frontal region in the center or the back of the head. He does have photophobia but denies photophobia and nausea and vomiting. He states that he will take a Goody powder to relieve his headache. He states that he only takes [place] Powder 1-2 times a week. The patient states that he continues to have dizziness. He describes this as a spinning sensation. These events can last for  minutes or up to an hour. The patient did go for 1 session of this to the rehabilitation and was supposed to follow up but has not done this. He returns today for an evaluation.  REVIEW OF SYSTEMS: Out of a complete 14 system review of symptoms, the patient complains only of the following symptoms, and all other reviewed systems are negative.  Dizziness, passing out  ALLERGIES: Allergies  Allergen Reactions  . Penicillins Hives  . Tetracyclines & Related Hives  . Biaxin [Clarithromycin]   . Ceftin [Cefuroxime Axetil]     HOME MEDICATIONS: Outpatient Medications Prior to Visit  Medication Sig Dispense Refill  . albuterol (PROVENTIL HFA;VENTOLIN HFA) 108 (90 BASE) MCG/ACT inhaler Inhale 1 puff into the lungs every 6 (six) hours as needed for wheezing or shortness of breath.    . diazepam (VALIUM) 5 MG tablet Take 1 tablet (5 mg total) by mouth every 6 (six) hours as needed for anxiety. 30 tablet 0  . DULoxetine 40 MG CPEP Take one at night po. (Patient taking differently: Patient takes one by mouth daily at bedtime) 30 capsule 3  . fludrocortisone (FLORINEF) 0.1 MG tablet Take 1 tablet (0.1 mg total) by mouth daily. 30 tablet 6  . fluticasone (FLONASE) 50 MCG/ACT nasal spray Place 1 spray into both nostrils daily.    Marland Kitchen loratadine (CLARITIN) 10 MG tablet Take 1 tablet (10 mg total) by mouth daily. 30 tablet 5   No facility-administered medications prior to visit.     PAST MEDICAL HISTORY: Past Medical  History:  Diagnosis Date  . Asthma   . Bilateral headaches 01/27/2015  . Headache   . Hearing loss   . Vertigo     PAST SURGICAL HISTORY: Past Surgical History:  Procedure Laterality Date  . SINUS ENDO W/FUSION      FAMILY HISTORY: Family History  Problem Relation Age of Onset  . Cancer Father     throat   . Migraines Mother   . Cancer Paternal Grandfather     SOCIAL HISTORY: Social History   Social History  . Marital status: Married    Spouse name: [name]  .  Number of children: 1  . Years of education: [employer]   Occupational History  . Not on file.   Social History Main Topics  . Smoking status: Former Smoker    Quit date: 04/05/2010  . Smokeless tobacco: Never Used     Comment: Uses vapor  . Alcohol use 0.0 oz/week     Comment: social  . Drug use: No  . Sexual activity: Yes    Partners: Female   Other Topics Concern  . Not on file   Social History Narrative   Patient lives at home with his wife [name]).   Patient is in the [employer]    Right handed.   Caffeine sweet tea once daily.      PHYSICAL EXAM  Vitals:   01/28/16 0937  BP: 118/84  Pulse: 68  Resp: 12  Weight: 194 lb 8 oz (88.2 kg)  Height: 5\' 11"  (1.803 m)   Body mass index is 27.13 kg/m.  Generalized: Well developed, in no acute distress   Neurological examination  Mentation: Alert oriented to time, place, history taking. Follows all commands speech and language fluent Cranial nerve II-XII: Pupils were equal round reactive to light. Extraocular movements were full, visual field were full on confrontational test. Facial sensation and strength were normal. Uvula tongue midline. Head turning and shoulder shrug  were normal and symmetric. Motor: The motor testing reveals 5 over 5 strength of all 4 extremities. Good symmetric motor tone is noted throughout.  Sensory: Sensory testing is intact to soft touch on all 4 extremities. No evidence of extinction is noted.  Coordination: Cerebellar testing reveals good finger-nose-finger and heel-to-shin bilaterally.  Gait and station: Gait is normal. Tandem gait is normal. Romberg is negative. No drift is seen.  Reflexes: Deep tendon reflexes are symmetric and normal bilaterally.   DIAGNOSTIC DATA (LABS, IMAGING, TESTING) - I reviewed patient records, labs, notes, testing and imaging myself where available.     ASSESSMENT AND PLAN 25 y.o. year old male  has a past medical history of Asthma; Bilateral headaches  (01/27/2015); Headache; Hearing loss; and Vertigo. here with:  1. Headache 2. Vertigo  The patient's headaches seem to be controlled with Cymbalta. He will continue on 40 mg daily. I have encouraged the patient to keep a journal and right down when he has dizziness. He is encouraged to be very detailed-noting any triggers, description of dizziness and longevity. At this point it does not seem that his dizziness has a neurological origin. We will continue to monitor the patient. He is advised that if his symptoms worsen or he develops any new symptoms he should let us know. He will follow-up in 3-4 months or sooner if needed.     Ward Givens, MSN, NP-C 01/28/2016, 9:36 AM [facility] [address], [place], [place] [address] [phone]

## 2016-02-03 ENCOUNTER — Ambulatory Visit (INDEPENDENT_AMBULATORY_CARE_PROVIDER_SITE_OTHER): Admitting: Neurology

## 2016-02-03 ENCOUNTER — Encounter: Payer: Self-pay | Admitting: Neurology

## 2016-02-03 ENCOUNTER — Telehealth: Payer: Self-pay | Admitting: Adult Health

## 2016-02-03 VITALS — BP 104/74 | HR 104 | Resp 20 | Ht 71.0 in | Wt 190.0 lb

## 2016-02-03 DIAGNOSIS — H814 Vertigo of central origin: Secondary | ICD-10-CM

## 2016-02-03 DIAGNOSIS — G90A Postural orthostatic tachycardia syndrome (POTS): Secondary | ICD-10-CM

## 2016-02-03 DIAGNOSIS — R000 Tachycardia, unspecified: Secondary | ICD-10-CM

## 2016-02-03 DIAGNOSIS — I951 Orthostatic hypotension: Secondary | ICD-10-CM | POA: Diagnosis not present

## 2016-02-03 DIAGNOSIS — H8149 Vertigo of central origin, unspecified ear: Secondary | ICD-10-CM | POA: Diagnosis not present

## 2016-02-03 DIAGNOSIS — I498 Other specified cardiac arrhythmias: Secondary | ICD-10-CM

## 2016-02-03 MED ORDER — PROPRANOLOL HCL ER 60 MG PO CP24: 60.0000 mg | ORAL_CAPSULE | Freq: Every day | ORAL | 5 refills | Status: DC

## 2016-02-03 NOTE — Telephone Encounter (Signed)
I spoke to Dr. Brett Fairy. She wants to see the pt today at 4:00pm. I called pt's wife. Pt's wife is agreeable to a 4:00pm appt today.

## 2016-02-03 NOTE — Telephone Encounter (Signed)
I spoke to pt's wife. He has been experiencing severe dizziness for the past 3 days, to the point where he cannot work. Last time this happened, pt's wife reports that Dr. Brett Fairy called in 7 days of valium for the pt, but pt's wife says that this did not help.  Pt's wife is wondering what else they can try for the pt's dizziness.

## 2016-02-03 NOTE — Progress Notes (Signed)
PATIENT: CLAIRMONT SUHY DOB: 04/14/1990  REASON FOR VISIT: follow up- headache, dizziness HISTORY FROM: patient  HISTORY OF PRESENT ILLNESS:   01-28-2016- MM Mr. Gurry is a 25 year old male with a history of headaches and dizziness. He returns today for follow-up. At the last visit his Cymbalta was increased to 40 mg. He reports this has been beneficial for his headaches. He has approximately 1 headache a week. He states his headaches are located all over. He confirms photophobia but denies nausea and vomiting. He continues to have dizziness. He is followed with cardiology and ENT-- all exams were unremarkable. He has been diagnosed with POTS but his cardiologist does not feel that this is the sole etiology of his dizziness. The patient was sent to vestibular rehabilitation however it was felt that his dizziness was not from a vestibular origin. The patient states that his dizziness is a room spinning sensation. However it can occur without movement of the head when he is at rest. He reports that sometimes his dizzy episodes can last for 30 seconds and as long as the entire day. His wife is with him today. She reports that he has seemed to tolerate these episodes better. He continues to work full-time. He returns today for an evaluation.  HISTORY Mr. Reim is a 25 year old male with a history of headaches and dizziness. He returns today for follow-up. He reports that initially Cymbalta was working well for his headaches and his mood. However now he reports that he typically has a headache every other day. His headache is only located in the frontal region in the center or the back of the head. He does have photophobia but denies photophobia and nausea and vomiting. He states that he will take a Goody powder to relieve his headache. He states that he only takes [place] Powder 1-2 times a week. The patient states that he continues to have dizziness. He describes this as a spinning sensation. These  events can last for minutes or up to an hour. The patient did go for 1 session of this to the rehabilitation and was supposed to follow up but has not done this. He returns today for an evaluation.  Interval history from 02/03/2016, Mr. Kramlich is seen year today in a revisit, 6 days after he saw my nurse practitioner for refills. He reports that his vertigo has gotten out of control, that he has a constant spinning sensation beginning when he wakes up in the morning and lasting throughout the day. It is not always triggered by movement and can occur at rest. It can occur when her movement has already been initiated and continued for several minutes, Last Thursday for example he felt rather well was helping in the household but when he took the trash out he had a sudden vertigo attack. Sometimes he gets nauseated from it but seldomlyr to the point where he has vomited. The sickness that is sometimes associated with vertigo -sometimes - has been interpreted by his cardiologist as a possible part of POTS symptom. He has not been placed on a beta blocker, Dr Skeet Latch is following at [facility]. He has not felt up to even walking today/     Jinny Blossom had advised him to keep a "vertigo diary", but over the last 5 days he had no relief from vertigo.        REVIEW OF SYSTEMS: Out of a complete 14 system review of symptoms, the patient complains only of the following symptoms,  and all other reviewed systems are negative.  Dizziness, passing out, clock wise and counterclockwise rotation vertigo.   ALLERGIES: Allergies  Allergen Reactions  . Penicillins Hives  . Tetracyclines & Related Hives  . Biaxin [Clarithromycin]   . Ceftin [Cefuroxime Axetil]     HOME MEDICATIONS: Outpatient Medications Prior to Visit  Medication Sig Dispense Refill  . albuterol (PROVENTIL HFA;VENTOLIN HFA) 108 (90 BASE) MCG/ACT inhaler Inhale 1 puff into the lungs every 6 (six) hours as needed for wheezing or  shortness of breath.    . DULoxetine 40 MG CPEP Take one at night po. (Patient taking differently: Patient takes one by mouth daily at bedtime) 30 capsule 3  . fluticasone (FLONASE) 50 MCG/ACT nasal spray Place 1 spray into both nostrils daily.    Marland Kitchen loratadine (CLARITIN) 10 MG tablet Take 1 tablet (10 mg total) by mouth daily. 30 tablet 5  . diazepam (VALIUM) 5 MG tablet Take 1 tablet (5 mg total) by mouth every 6 (six) hours as needed for anxiety. (Patient not taking: Reported on 02/03/2016) 30 tablet 0  . fludrocortisone (FLORINEF) 0.1 MG tablet Take 1 tablet (0.1 mg total) by mouth daily. (Patient not taking: Reported on 02/03/2016) 30 tablet 6   No facility-administered medications prior to visit.     PAST MEDICAL HISTORY: Past Medical History:  Diagnosis Date  . Asthma   . Bilateral headaches 01/27/2015  . Headache   . Hearing loss   . Vertigo     PAST SURGICAL HISTORY: Past Surgical History:  Procedure Laterality Date  . SINUS ENDO W/FUSION      FAMILY HISTORY: Family History  Problem Relation Age of Onset  . Cancer Father     throat   . Migraines Mother   . Cancer Paternal Grandfather     SOCIAL HISTORY: Social History   Social History  . Marital status: Married    Spouse name: [name]  . Number of children: 1  . Years of education: [employer]   Occupational History  . Not on file.   Social History Main Topics  . Smoking status: Former Smoker    Quit date: 04/05/2010  . Smokeless tobacco: Never Used     Comment: Uses vapor  . Alcohol use 0.0 oz/week     Comment: social  . Drug use: No  . Sexual activity: Yes    Partners: Female   Other Topics Concern  . Not on file   Social History Narrative   Patient lives at home with his wife [name]).   Patient is in the [employer]    Right handed.   Caffeine sweet tea once daily.   CLINICAL DATA:  Vertigo and dizziness since sinus surgery 1 month ago.  EXAM: MRI HEAD WITHOUT AND WITH  CONTRAST  TECHNIQUE: Multiplanar, multiecho pulse sequences of the brain and surrounding structures were obtained without and with intravenous contrast.  CONTRAST:  16 mL MultiHance  COMPARISON:  Head CT 10/11/2008  FINDINGS: There is no acute infarct. Ventricles and sulci are normal for age. There is no evidence of intracranial hemorrhage, mass, midline shift, or extra-axial fluid collection. No brain parenchymal signal abnormality or abnormal enhanced is identified.  Orbits are unremarkable. Mastoid air cells are clear. Mild bilateral ethmoid air cell and maxillary sinus mucosal thickening is noted. Small amount of proteinaceous material or blood products are noted in the left frontal sinus. Major intracranial vascular flow voids are preserved. Calvarium and scalp soft tissues are unremarkable.  IMPRESSION: No evidence  of acute intracranial abnormality. Unremarkable appearance of the brain.   Electronically Signed   By: Logan Bores   On: 02/05/2014 10:09  PHYSICAL EXAM  Vitals:   02/03/16 1611  BP: 104/74  Pulse: (!) 104  Resp: 20  Weight: 190 lb (86.2 kg)  Height: 5\' 11"  (1.803 m)   Body mass index is 26.5 kg/m.  Generalized: Well developed, in acute distress   Neurological examination  Mentation: Alert oriented to time, place, history taking. Follows all commands speech and language fluent Cranial nerve II-XII: Pupils were dilated but promptly equal  reactive to light.  Extraocular movements were full,  There is endpoint nystagmus , worse with gaze to the left. There is reported dizziness with accomodation. No diplopia. visual field were full on confrontational test.  Facial sensation and strength were normal. Uvula and  tongue midline. Head turning and shoulder shrug were symmetric. Motor:  5 over 5 strength of all 4 extremities. intact motor tone is noted throughout.  Sensory: Sensory testing deferred Coordination:  finger-nose-finger intact  Gait  and station: Gait is slowed, the patient is unsteady .  Tandem gait  Deferred. Reflexes: Deep tendon reflexes are symmetric and normal bilaterally.   The patient is tachycardiac through out exam and interview. I asked him to rest supine on the exam table, lifted his legs and his heart rate increased further.   DIAGNOSTIC DATA (LABS, IMAGING, TESTING) - I reviewed patient records, labs, notes, testing and imaging myself where available.   Dr.Tiffany Lilia Pro wrote for florinef, if not working , next step beta blocker. Confirmed by phone today with Dr Benn Moulder, patient has already a  follow up on the 9th of Nov.at [facility])    ASSESSMENT AND PLAN 25 y.o. year old male  has a past medical history of Asthma; Bilateral headaches (01/27/2015); Headache; Hearing loss; and Vertigo. here with:  POTS - tachycardia of  Sinus/ ventricular origin  Vertigo, unrelated to sudden movements,  Headaches - resolved on Cymbalta.    True Vertigo, adrenergic outflow, dilated pupils, fast heart rate.  Start Propanolol ER at night only, follow up with cardiology on the 9t Nov.  Follow up with [place] in 2-3 month.   DOHMEIER,CARMEN, MD    02/03/2016, 4:32 PM [facility] [address] [place], [place] [address] [phone]

## 2016-02-03 NOTE — Telephone Encounter (Signed)
Pt's wife called to advise she spoke with the pt and he said Dr Brett Fairy has been treating him for vertigo and would like her or her RN to return the call. She said Jinny Blossom sees him for migraines. Thank you

## 2016-02-03 NOTE — Patient Instructions (Signed)
Postural Orthostatic Tachycardia Syndrome Postural orthostatic tachycardia syndrome (POTS) is an increased heart rate when going from a lying (supine) position to a standing position. The heart rate may increase more than 30 beats per minute (BPM) above its resting rate when going from a lying to a standing position. POTS occurs more frequently in women than in men.  SYMPTOMS  POTS symptoms may be increased in the morning. Symptoms of POTS include:  Fainting or near fainting.  Inability to think clearly.  Extreme or chronic fatigue.  Exercise intolerance.  Chest pain.  Having the lower legs develop a reddish-blue color due to decreased blood flow (acrocyanosis). CAUSES POTS can be caused by different conditions. Sometimes, it has no known cause (idiopathic). Some causes of POTS include:  Viral illness.  Pregnancy.  Autoimmune diseases.  Medications.  Major surgery.  Trauma such as a car accident or major injury.  Medical conditions such as anemia, dehydration, and hyperthyroidism. DIAGNOSIS  POTS is diagnosed by:  Taking a complete history and physical exam.  Measuring the heart rate while lying and then upon standing.  Measuring blood pressure when going from a lying to a standing position. POTS is usually not associated with low blood pressure (orthostatic hypotension) when going from a lying to standing position. While standing, blood pressure should be taken 2, 5, and 10 minutes after getting up. TREATMENT  Treatment of POTS depends upon the severity of the symptoms. Treatment includes:  Drinking plenty of fluids to avoid getting dehydrated.  Avoiding very hot environments to not get overheated.  Increasing your dietary salt intake as instructed by your caregiver.  Taking different types of medications as prescribed for POTS.  Avoiding some classes of medications such as vasodilators and diuretics. SEEK IMMEDIATE MEDICAL CARE IF  You have severe chest pain  that does not go away. Call your local emergency service immediately.  You feel your heart racing or beating rapidly.  You feel like passing out.  You have very confused thinking. MAKE SURE YOU  Understand these instructions.  Will watch your condition.  Will get help right away if you are not doing well or get worse.   This information is not intended to replace advice given to you by your health care provider. Make sure you discuss any questions you have with your health care provider.   Document Released: 03/12/2002 Document Revised: 04/12/2014 Document Reviewed: 05/20/2010 Elsevier Interactive Patient Education [employer].

## 2016-02-03 NOTE — Telephone Encounter (Signed)
Needs Rv , left VM to schedule revisit. I need to see him to evaluate for other causes of dizziness.  CD

## 2016-02-03 NOTE — Telephone Encounter (Signed)
Patient's wife is calling. For the past 3 days the patient is experiencing severe dizziness and has been unable to work. Please call to discuss.

## 2016-02-10 ENCOUNTER — Encounter: Payer: Self-pay | Admitting: Cardiovascular Disease

## 2016-02-12 ENCOUNTER — Encounter: Payer: Self-pay | Admitting: Cardiovascular Disease

## 2016-02-12 ENCOUNTER — Ambulatory Visit (INDEPENDENT_AMBULATORY_CARE_PROVIDER_SITE_OTHER): Admitting: Cardiovascular Disease

## 2016-02-12 VITALS — BP 93/64 | HR 73 | Ht 71.0 in | Wt 192.6 lb

## 2016-02-12 DIAGNOSIS — I498 Other specified cardiac arrhythmias: Secondary | ICD-10-CM

## 2016-02-12 DIAGNOSIS — R0789 Other chest pain: Secondary | ICD-10-CM

## 2016-02-12 DIAGNOSIS — I951 Orthostatic hypotension: Secondary | ICD-10-CM | POA: Diagnosis not present

## 2016-02-12 DIAGNOSIS — R000 Tachycardia, unspecified: Secondary | ICD-10-CM | POA: Diagnosis not present

## 2016-02-12 DIAGNOSIS — G90A Postural orthostatic tachycardia syndrome (POTS): Secondary | ICD-10-CM

## 2016-02-12 MED ORDER — FLUDROCORTISONE ACETATE 0.1 MG PO TABS: ORAL_TABLET | ORAL | 6 refills | Status: DC

## 2016-02-12 MED ORDER — PROPRANOLOL HCL 10 MG PO TABS: 10.0000 mg | ORAL_TABLET | Freq: Two times a day (BID) | ORAL | 6 refills | Status: DC

## 2016-02-12 NOTE — Patient Instructions (Addendum)
Your physician has recommended you make the following change in your medication:   1.) STOP the propanolol 60 mg tablets. This has been replaced with 10 mg tablets.  2.) the fludrocortisone has been increased to 2 tablets daily. If your symptoms are not better by Tuesday. Increase to 3 tablets daily.  Your physician recommends that you schedule a follow-up appointment in:   2 weeks with Dr Oval Linsey or EP.

## 2016-02-12 NOTE — Progress Notes (Signed)
Cardiology Office Note   Date:  02/12/2016   ID:  Dustin [name], DOB [birth year], MRN [id number]  PCP:  Gara Kroner, MD  Cardiologist:   Skeet Latch, MD   Chief Complaint  Patient presents with  . Follow-up    PT C/O SOB     History of Present Illness: Dustin [name] is a 25 y.o. male with POTS who presents for follow up.  Dustin [name] was seen 12/2015 with dizziness and chest pain.   September 2015 Dustin [name] had an episode of syncope while standing in formation in the [employer].  He developed a headache and then dizziness prior to passing out. He had been standing at attention for approximately 20-30 minutes before the event. Since then he continues to have almost daily dizziness. It occurs both at rest and when standing. He also reports being evaluated by a cardiologist near [facility] where he had a normal echo and wore a 7 day event monitor that was abnormal. He did have symptoms while wearing the monitor but it was unrevealing.  Dustin [name] was evaluated by neurology and treated for headache and low serotonin levels with Cymbalta.  This helped the headaches. However, his dizziness persisted.  He was diagnosed with vertigo and underwent vestibular rehab.   while in rehabilitation he was noted to have drastic changes in his heart rate with standing. His heart rate changde rapidly from 150-50 within seconds. Therefore, he was referred back to cardiology for a second opinion.  He was referred for an exercise Myoview 01/02/16 that revealed LVEF 57% and no ischemia.  At his last appointment Dustin [name] was not orthostatic, but his heart rate increased with positional changes.  He was encouraged to increase his fluid and salt intake, which he has done.  He was started on Florinif 0.1mg  daily.  He hasn't noted any changes in his symptoms.  He continues to have dizziness almost daily.  His heart rate continues to increase rapidly so his neurologist started him on propranolol 60mg  daily.   His resting heart rate was 114 bpm at that appointment.  He does note improvement in the tachycardia.  However, he has been feeling short of breath since starting this medication.  It was worse when he first started it and is now starting to improve.  He denies chest pain or lower extremity edema.  He notes that he has been taking both Florinef and propranolol at night when he takes his Cymbalta.     Past Medical History:  Diagnosis Date  . Asthma   . Bilateral headaches 01/27/2015  . Headache   . Hearing loss   . Vertigo     Past Surgical History:  Procedure Laterality Date  . SINUS ENDO W/FUSION       Current Outpatient Prescriptions  Medication Sig Dispense Refill  . albuterol (PROVENTIL HFA;VENTOLIN HFA) 108 (90 BASE) MCG/ACT inhaler Inhale 1 puff into the lungs every 6 (six) hours as needed for wheezing or shortness of breath.    . diazepam (VALIUM) 5 MG tablet Take 1 tablet (5 mg total) by mouth every 6 (six) hours as needed for anxiety. 30 tablet 0  . DULoxetine 40 MG CPEP Take one at night po. (Patient taking differently: Patient takes one by mouth daily at bedtime) 30 capsule 3  . fludrocortisone (FLORINEF) 0.1 MG tablet Take 1 tablet (0.1 mg total) by mouth daily. 30 tablet 6  . fluticasone (FLONASE) 50 MCG/ACT nasal spray Place 1 spray into  both nostrils daily.    Marland Kitchen loratadine (CLARITIN) 10 MG tablet Take 1 tablet (10 mg total) by mouth daily. 30 tablet 5  . propranolol ER (INDERAL LA) 60 MG 24 hr capsule Take 1 capsule (60 mg total) by mouth at bedtime. 30 capsule 5   No current facility-administered medications for this visit.     Allergies:   Biaxin [clarithromycin]; Ceftin [cefuroxime axetil]; Penicillins; and Tetracyclines & related    Social History:  The patient  reports that he quit smoking about 5 years ago. He has never used smokeless tobacco. He reports that he drinks alcohol. He reports that he does not use drugs.   Family History:  The patient's family  history includes Cancer in his father and paternal grandfather; Migraines in his mother.    ROS:  Please see the history of present illness.   Otherwise, review of systems are positive for none.   All other systems are reviewed and negative.    PHYSICAL EXAM: VS:  BP 93/64 (BP Location: Right Arm, Patient Position: Sitting, Cuff Size: Normal)   Pulse 73   Ht 5\' 11"  (1.803 m)   Wt 87.4 kg (192 lb 9.6 oz)   SpO2 98%   BMI 26.86 kg/m  , BMI Body mass index is 26.86 kg/m. GENERAL:  Well appearing HEENT:  Pupils equal round and reactive, fundi not visualized, oral mucosa unremarkable NECK:  No jugular venous distention, waveform within normal limits, carotid upstroke brisk and symmetric, no bruits LYMPHATICS:  No cervical adenopathy LUNGS:  Clear to auscultation bilaterally HEART:  RRR.  PMI not displaced or sustained,S1 and S2 within normal limits, no S3, no S4, no clicks, no rubs, no murmurs ABD:  Flat, positive bowel sounds normal in frequency in pitch, no bruits, no rebound, no guarding, no midline pulsatile mass, no hepatomegaly, no splenomegaly EXT:  2 plus pulses throughout, no edema, no cyanosis no clubbing SKIN:  No rashes no nodules NEURO:  Cranial nerves II through XII grossly intact, motor grossly intact throughout PSYCH:  Cognitively intact, oriented to person place and time   EKG:  EKG is not ordered today. The ekg ordered 12/31/15 demonstrates sinus rhythm. Rate 67 bpm. It appeared to wave inversion.   Recent Labs: No results found for requested labs within last 8760 hours.    Lipid Panel No results found for: CHOL, TRIG, HDL, CHOLHDL, VLDL, LDLCALC, LDLDIRECT    Wt Readings from Last 3 Encounters:  02/12/16 87.4 kg (192 lb 9.6 oz)  02/03/16 86.2 kg (190 lb)  01/28/16 88.2 kg (194 lb 8 oz)      ASSESSMENT AND PLAN:  # POTS: Dustin [name] symptoms persist despite starting propranolol and Florinef.  His heart rate has improved significantly.  He has been taking  the Florinef at night.  We discussed switching this to the AM and increasing the dose to 0.2mg .  If his symptoms persist he can increase this to 0.3mg .  We will switch propranolol to 10mg  bid.  Continue increased fluid and salt intake.  We will check a BMP at his follow up appointment.  # Chest pain: Symptoms have improved and exercise Myoview was negative for ischemia.    Current medicines are reviewed at length with the patient today.  The patient does not have concerns regarding medicines.  The following changes have been made: Increase florinf to 0.2mg  daily.  Reduce proranolol to 10mg  bid  Labs/ tests ordered today include:   No orders of the defined types were placed  in this encounter.    Disposition:   FU with Tiffany C. Oval Linsey, MD, [facility] in 2 weeks   This note was written with the assistance of speech recognition software.  Please excuse any transcriptional errors.  Signed, Tiffany C. Oval Linsey, MD, [facility]  02/12/2016 9:50 AM    [facility]

## 2016-02-13 ENCOUNTER — Encounter: Payer: Self-pay | Admitting: Cardiovascular Disease

## 2016-02-13 DIAGNOSIS — I951 Orthostatic hypotension: Secondary | ICD-10-CM

## 2016-02-13 DIAGNOSIS — G90A Postural orthostatic tachycardia syndrome (POTS): Secondary | ICD-10-CM | POA: Insufficient documentation

## 2016-02-13 DIAGNOSIS — I498 Other specified cardiac arrhythmias: Secondary | ICD-10-CM

## 2016-02-13 DIAGNOSIS — R000 Tachycardia, unspecified: Secondary | ICD-10-CM

## 2016-02-13 HISTORY — DX: Other specified cardiac arrhythmias: I49.8

## 2016-02-13 HISTORY — DX: Postural orthostatic tachycardia syndrome (POTS): G90.A

## 2016-02-16 ENCOUNTER — Telehealth: Payer: Self-pay | Admitting: Cardiovascular Disease

## 2016-02-16 NOTE — Telephone Encounter (Signed)
Spoke to wife of patient, DPR. Notes that he had intermittent chest pain last night, describes as "tightening and releasing", he didn't sleep well due to this. Some pain radiating to arm and diaphoresis. He is doing better today. Declined to go to ED. Currently at work. Wife would like to see if he can be seen in office this afternoon. She notes he could also come in tomorrow.  They are aware and compliant w recent med changes.  Will review w Dr. Oval Linsey.

## 2016-02-16 NOTE — Telephone Encounter (Signed)
Pt  Started having chest  Discomfort this morning around 4:00,it last for a couple hours. She thinks pt needs to be seen.Pt is at work,but the last time she talked to him,he was having some discomfort.

## 2016-02-16 NOTE — Telephone Encounter (Signed)
Discussed w Dr. Oval Linsey who noted recommendations based on neg myoview - suggested OTC zantac or pepcid & f/u w PCP.  These instructions relayed to caller, who voiced understanding. Will have pt try OTC zantac (she has on-hand for her own use) and follow up next week for discussion of other meds as previously recommended. Aware to follow up w PCP for discussion of non-cardiac causes of CP

## 2016-02-23 ENCOUNTER — Ambulatory Visit: Admitting: Cardiovascular Disease

## 2016-02-23 ENCOUNTER — Other Ambulatory Visit: Payer: Self-pay | Admitting: Neurology

## 2016-02-23 ENCOUNTER — Telehealth: Payer: Self-pay | Admitting: Neurology

## 2016-02-23 DIAGNOSIS — R51 Headache: Principal | ICD-10-CM

## 2016-02-23 DIAGNOSIS — R519 Headache, unspecified: Secondary | ICD-10-CM

## 2016-02-23 MED ORDER — DULOXETINE HCL 40 MG PO CPEP: 40.0000 mg | ORAL_CAPSULE | Freq: Two times a day (BID) | ORAL | 3 refills | Status: DC

## 2016-02-23 MED ORDER — DULOXETINE HCL 40 MG PO CPEP: 80.0000 mg | ORAL_CAPSULE | Freq: Every day | ORAL | 5 refills | Status: DC

## 2016-02-23 MED ORDER — DULOXETINE HCL 40 MG PO CPEP: 40.0000 mg | ORAL_CAPSULE | Freq: Every day | ORAL | 5 refills | Status: DC

## 2016-02-23 NOTE — Telephone Encounter (Signed)
Wife called to request refill of DULoxetine 40 MG CPEP , states husband ran out sooner due to change from 1 a day to 2 a day.

## 2016-02-23 NOTE — Telephone Encounter (Signed)
Order for duloxetine now says "take 40 mg two times daily." Take 2 and night po." This is confusing. It could be read as take 1 capsule in the am and 2 at pm.  Please clarify. Pt should only be taking 2 capsules qhs, correct?

## 2016-02-23 NOTE — Telephone Encounter (Signed)
I called pt's wife to advise her that the RX for duloxetine was changed to say pt should take 2 at night and send to the pharmacy. No answer, left a message asking her to call me back.

## 2016-02-23 NOTE — Telephone Encounter (Signed)
I spoke to pt's wife, [name], per DPR. Pt's wife says that Dr. Brett Fairy told them in the last office visit that pt should be taking 2 capsules of the duloxetine 40mg . However, the RX was not changed to reflect this. Pt only has a week left of the medication since he has been taking 2 capsules instead of 1 capsule. I reviewed Dr. Edwena Felty notes from 02/03/2016 and I cannot find mention of this increase.  Dr. Brett Fairy, please review and make the necessary change to the duloxetine if applicable.

## 2016-02-23 NOTE — Telephone Encounter (Signed)
To be taken 2 tabs at night. 80 mg total by mouth. CYMBALTA

## 2016-02-23 NOTE — Telephone Encounter (Signed)
This RX is still incorrect, I believe. It now says, "Take 40mg  by mouth at bedtime, 2 at night po." However, you ordered a 40 mg capsule, so it should say "Take 80mg  at bedtime by mouth." I have pended the order. Please make sure that it is correct.

## 2016-02-23 NOTE — Telephone Encounter (Signed)
Increased duloxetine to 80 mg. CD refilled at higher dose.

## 2016-02-23 NOTE — Addendum Note (Signed)
Addended by: Lester Wilson A on: 02/23/2016 11:43 AM   Modules accepted: Orders

## 2016-02-23 NOTE — Addendum Note (Signed)
Addended by: Lester Linden A on: 02/23/2016 10:24 AM   Modules accepted: Orders

## 2016-03-01 ENCOUNTER — Ambulatory Visit: Admitting: Neurology

## 2016-03-22 ENCOUNTER — Telehealth: Payer: Self-pay | Admitting: *Deleted

## 2016-03-22 ENCOUNTER — Other Ambulatory Visit: Payer: Self-pay

## 2016-03-22 MED ORDER — FLUDROCORTISONE ACETATE 0.1 MG PO TABS: ORAL_TABLET | ORAL | 5 refills | Status: DC

## 2016-03-22 NOTE — Telephone Encounter (Signed)
Received a refill request from [facility] regarding patients Florinef being increased to 3 daily Below per Dr Blenda Mounts office visit 02/12/16   ASSESSMENT AND PLAN:  # POTS: Mr. Cranmer's symptoms persist despite starting propranolol and Florinef.  His heart rate has improved significantly.  He has been taking the Florinef at night.  We discussed switching this to the AM and increasing the dose to 0.2mg .  If his symptoms persist he can increase this to 0.3mg .  We will switch propranolol to 10mg  bid.  Continue increased fluid and salt intake.  We will check a BMP at his follow up appointment.  New Rx for 3 daily sent to pharmacy

## 2016-04-24 ENCOUNTER — Other Ambulatory Visit: Payer: Self-pay | Admitting: Neurology

## 2016-04-29 ENCOUNTER — Encounter: Payer: Self-pay | Admitting: Neurology

## 2016-04-29 ENCOUNTER — Ambulatory Visit (INDEPENDENT_AMBULATORY_CARE_PROVIDER_SITE_OTHER): Admitting: Neurology

## 2016-04-29 VITALS — BP 120/86 | HR 86 | Resp 20 | Ht 71.0 in | Wt 194.0 lb

## 2016-04-29 DIAGNOSIS — R000 Tachycardia, unspecified: Secondary | ICD-10-CM

## 2016-04-29 DIAGNOSIS — IMO0001 Reserved for inherently not codable concepts without codable children: Secondary | ICD-10-CM

## 2016-04-29 DIAGNOSIS — I498 Other specified cardiac arrhythmias: Secondary | ICD-10-CM

## 2016-04-29 DIAGNOSIS — I951 Orthostatic hypotension: Secondary | ICD-10-CM

## 2016-04-29 DIAGNOSIS — H8143 Vertigo of central origin, bilateral: Secondary | ICD-10-CM | POA: Diagnosis not present

## 2016-04-29 DIAGNOSIS — G90A Postural orthostatic tachycardia syndrome (POTS): Secondary | ICD-10-CM

## 2016-04-29 NOTE — Progress Notes (Signed)
PATIENT: Dustin [name] DOB: 18-Jul-1990  REASON FOR VISIT: follow up- headache, dizziness HISTORY FROM: patient  HISTORY OF PRESENT ILLNESS:   01-28-2016- MM Dustin [name] is a 26 year old male with a history of headaches and dizziness. He returns today for follow-up. At the last visit his Cymbalta was increased to 40 mg. He reports this has been beneficial for his headaches. He has approximately 1 headache a week.  He states his headaches are located all over. He confirms photophobia but denies nausea and vomiting. He continues to have dizziness. He is followed with cardiology and ENT-- all exams were unremarkable. He has been diagnosed with POTS but his cardiologist does not feel that this is the sole etiology of his dizziness. The patient was sent to vestibular rehabilitation however it was felt that his dizziness was not from a vestibular origin. The patient states that his dizziness is a room spinning sensation. However it can occur without movement of the head when he is at rest. He reports that sometimes his dizzy episodes can last for 30 seconds and as long as the entire day. His wife is with him today. She reports that he has seemed to tolerate these episodes better. He continues to work full-time. He returns today for an evaluation.  HISTORY Dustin [name] is a 26 year old male with a history of headaches and dizziness. He returns today for follow-up. He reports that initially Cymbalta was working well for his headaches and his mood. However now he reports that he typically has a headache every other day. His headache is only located in the frontal region in the center or the back of the head. He does have photophobia but denies photophobia and nausea and vomiting. He states that he will take a Goody powder to relieve his headache. He states that he only takes [place] Powder 1-2 times a week. The patient states that he continues to have dizziness. He describes this as a spinning sensation. These  events can last for minutes or up to an hour. The patient did go for 1 session of this to the rehabilitation and was supposed to follow up but has not done this. He returns today for an evaluation.  Interval history from 02/03/2016, Dustin [name] is seen year today in a revisit, 6 days after he saw my nurse practitioner for refills. He reports that his vertigo has gotten out of control, that he has a constant spinning sensation beginning when he wakes up in the morning and lasting throughout the day. It is not always triggered by movement and can occur at rest. It can occur when her movement has already been initiated and continued for several minutes, Last Thursday for example he felt rather well was helping in the household but when he took the trash out he had a sudden vertigo attack. Sometimes he gets nauseated from it but seldomlyr to the point where he has vomited. The sickness that is sometimes associated with vertigo -sometimes - has been interpreted by his cardiologist as a possible part of POTS symptom. He has not been placed on a beta blocker, Dr Skeet Latch is following at [facility]. He has not felt up to even walking today/   Jinny Blossom had advised him to keep a "vertigo diary", but over the last 5 days he had no relief from vertigo.   04-29-2016 I have pleasure to see Dustin [name] today, still suffers from a relentless dizziness. The tachycardia and near fainting has improved on the use of  a beta blocker he no longer has palpitations or in a very much milder form. His headaches remain controlled on Cymbalta. His dizziness is what remains. He is well hydrated, he has no LOC.  He walks steady, he has gotten used to dizziness. MRI brain was normal  E dizziness. [facility] and audiology felt it was central/ not inner ear.  All complaints begun after a sinus surgery. MRI was performed soon after in 2015    REVIEW OF SYSTEMS: Out of a complete 14 system review of symptoms, the patient  complains only of the following symptoms, and all other reviewed systems are negative.  Dizziness, passing out, clock wise and counterclockwise rotation vertigo.   ALLERGIES: Allergies  Allergen Reactions  . Biaxin [Clarithromycin] Hives  . Ceftin [Cefuroxime Axetil] Hives  . Penicillins Hives  . Tetracyclines & Related Hives    HOME MEDICATIONS: Outpatient Medications Prior to Visit  Medication Sig Dispense Refill  . DULoxetine HCl 40 MG CPEP Take 80 mg by mouth at bedtime. 2 at night po. 60 capsule 5  . loratadine (CLARITIN) 10 MG tablet TAKE 1 TABLET(10 MG) BY MOUTH DAILY 30 tablet 0  . propranolol (INDERAL) 10 MG tablet Take 1 tablet (10 mg total) by mouth 2 (two) times daily. 60 tablet 6  . albuterol (PROVENTIL HFA;VENTOLIN HFA) 108 (90 BASE) MCG/ACT inhaler Inhale 1 puff into the lungs every 6 (six) hours as needed for wheezing or shortness of breath.    . diazepam (VALIUM) 5 MG tablet Take 1 tablet (5 mg total) by mouth every 6 (six) hours as needed for anxiety. 30 tablet 0  . fludrocortisone (FLORINEF) 0.1 MG tablet Take 3 tablets by mouth daily. 90 tablet 5  . fluticasone (FLONASE) 50 MCG/ACT nasal spray Place 1 spray into both nostrils daily.     No facility-administered medications prior to visit.     PAST MEDICAL HISTORY: Past Medical History:  Diagnosis Date  . Asthma   . Bilateral headaches 01/27/2015  . Headache   . Hearing loss   . POTS (postural orthostatic tachycardia syndrome) 02/13/2016  . Vertigo     PAST SURGICAL HISTORY: Past Surgical History:  Procedure Laterality Date  . SINUS ENDO W/FUSION      FAMILY HISTORY: Family History  Problem Relation Age of Onset  . Cancer Father     throat   . Migraines Mother   . Cancer Paternal Grandfather     SOCIAL HISTORY: Social History   Social History  . Marital status: Married    Spouse name: Dustin [name]  . Number of children: 1  . Years of education: [employer]   Occupational History  . Not on file.    Social History Main Topics  . Smoking status: Former Smoker    Quit date: 04/05/2010  . Smokeless tobacco: Never Used     Comment: Uses vapor  . Alcohol use 0.0 oz/week     Comment: social  . Drug use: No  . Sexual activity: Yes    Partners: Female   Other Topics Concern  . Not on file   Social History Narrative   Patient lives at home with his wife Dustin [name]).   Patient is in the [employer]    Right handed.   Caffeine sweet tea once daily.   CLINICAL DATA:  Vertigo and dizziness since sinus surgery 1 month ago.  EXAM: MRI HEAD WITHOUT AND WITH CONTRAST  TECHNIQUE: Multiplanar, multiecho pulse sequences of the brain and surrounding structures were  obtained without and with intravenous contrast.  CONTRAST:  16 mL MultiHance  COMPARISON:  Head CT 10/11/2008  FINDINGS: There is no acute infarct. Ventricles and sulci are normal for age. There is no evidence of intracranial hemorrhage, mass, midline shift, or extra-axial fluid collection. No brain parenchymal signal abnormality or abnormal enhanced is identified.  Orbits are unremarkable. Mastoid air cells are clear. Mild bilateral ethmoid air cell and maxillary sinus mucosal thickening is noted. Small amount of proteinaceous material or blood products are noted in the left frontal sinus. Major intracranial vascular flow voids are preserved. Calvarium and scalp soft tissues are unremarkable.  IMPRESSION: No evidence of acute intracranial abnormality. Unremarkable appearance of the brain.   Electronically Signed   By: Logan Bores   On: 02/05/2014 10:09  PHYSICAL EXAM  Vitals:   04/29/16 0937  BP: 120/86  Pulse: 86  Resp: 20  Weight: 194 lb (88 kg)  Height: 5\' 11"  (1.803 m)   Body mass index is 27.06 kg/m.  Generalized: Well developed, in acute distress   Neurological examination  Mentation: Alert oriented to time, place, history taking. Follows all commands speech and language fluent Cranial  nerve ; No change in taste or smell. Chronic nasal congestion.  Pupils were dilated but promptly equal  reactive to light.  Extraocular movements were full,  There is endpoint nystagmus , worse with gaze to the left. There is reported dizziness with accomodation. No diplopia. visual field were full on confrontational test.  Facial sensation and strength were normal. Uvula and  tongue midline. Head turning and shoulder shrug were symmetric. Motor:  5 over 5 strength of all 4 extremities. intact motor tone is noted throughout.  Sensory: Sensory testing deferred Coordination:  finger-nose-finger intact  Gait and station: Gait is slowed, the patient is unsteady .  Tandem gait  Deferred. Reflexes: Deep tendon reflexes are symmetric and normal bilaterally.   The patient is tachycardiac through out exam and interview. I asked him to rest supine on the exam table, lifted his legs and his heart rate increased further.   DIAGNOSTIC DATA (LABS, IMAGING, TESTING) - I reviewed patient records, labs, notes, testing and imaging myself where available.   Dr.Tiffany Lilia Pro wrote for Florinef was not working- ineffective  ,changed to beta blocker.  Confirmed by phone today with Dr Benn Moulder, patient has already a  follow up on the 9th of Tobias.    ASSESSMENT AND PLAN 26 y.o. year old male  has a past medical history of Asthma; Bilateral headaches (01/27/2015); Headache; Hearing loss; POTS (postural orthostatic tachycardia syndrome) (02/13/2016); and Vertigo. here with:  POTS - tachycardia of  Sinus/ ventricular origin  Vertigo, unrelated to sudden movements,  Headaches - resolved on Cymbalta.    True Vertigo, adrenergic outflow, dilated pupils, had dizziness and previously  fast heart rate.  Started  Propanolol ER at night only, follow up with cardiology on May 05 2016. He reports no changes, except tachycardia control- no palpitations.  Propanolol has not positively affected his vertigo  complaint. I will repeat MRI study with and without contrast, and MRA brain.     Follow up with [place] in 6 month unless MRI is abnormal..   DOHMEIER,CARMEN, MD    04/29/2016, 9:59 AM [facility] [address], [place], [place] [address] [phone]

## 2016-04-29 NOTE — Patient Instructions (Signed)
Postural Orthostatic Tachycardia Syndrome Postural orthostatic tachycardia syndrome (POTS) is an increased heart rate when going from a lying (supine) position to a standing position. The heart rate may increase more than 30 beats per minute (BPM) above its resting rate when going from a lying to a standing position. POTS occurs more frequently in women than in men.  SYMPTOMS  POTS symptoms may be increased in the morning. Symptoms of POTS include:  Fainting or near fainting.  Inability to think clearly.  Extreme or chronic fatigue.  Exercise intolerance.  Chest pain.  Having the lower legs develop a reddish-blue color due to decreased blood flow (acrocyanosis). CAUSES POTS can be caused by different conditions. Sometimes, it has no known cause (idiopathic). Some causes of POTS include:  Viral illness.  Pregnancy.  Autoimmune diseases.  Medications.  Major surgery.  Trauma such as a car accident or major injury.  Medical conditions such as anemia, dehydration, and hyperthyroidism. DIAGNOSIS  POTS is diagnosed by:  Taking a complete history and physical exam.  Measuring the heart rate while lying and then upon standing.  Measuring blood pressure when going from a lying to a standing position. POTS is usually not associated with low blood pressure (orthostatic hypotension) when going from a lying to standing position. While standing, blood pressure should be taken 2, 5, and 10 minutes after getting up. TREATMENT  Treatment of POTS depends upon the severity of the symptoms. Treatment includes:  Drinking plenty of fluids to avoid getting dehydrated.  Avoiding very hot environments to not get overheated.  Increasing your dietary salt intake as instructed by your caregiver.  Taking different types of medications as prescribed for POTS.  Avoiding some classes of medications such as vasodilators and diuretics. SEEK IMMEDIATE MEDICAL CARE IF  You have severe chest pain  that does not go away. Call your local emergency service immediately.  You feel your heart racing or beating rapidly.  You feel like passing out.  You have very confused thinking. MAKE SURE YOU  Understand these instructions.  Will watch your condition.  Will get help right away if you are not doing well or get worse. This information is not intended to replace advice given to you by your health care provider. Make sure you discuss any questions you have with your health care provider. Document Released: 03/12/2002 Document Revised: 04/12/2014 Document Reviewed: 10/04/2014 Elsevier Interactive Patient Education  2017 [employer].

## 2016-05-07 ENCOUNTER — Ambulatory Visit (INDEPENDENT_AMBULATORY_CARE_PROVIDER_SITE_OTHER): Admitting: Cardiovascular Disease

## 2016-05-07 ENCOUNTER — Encounter: Payer: Self-pay | Admitting: Cardiovascular Disease

## 2016-05-07 VITALS — BP 110/78 | HR 82 | Ht 71.0 in | Wt 192.0 lb

## 2016-05-07 DIAGNOSIS — R0789 Other chest pain: Secondary | ICD-10-CM | POA: Diagnosis not present

## 2016-05-07 DIAGNOSIS — I951 Orthostatic hypotension: Secondary | ICD-10-CM

## 2016-05-07 DIAGNOSIS — R000 Tachycardia, unspecified: Secondary | ICD-10-CM

## 2016-05-07 DIAGNOSIS — I498 Other specified cardiac arrhythmias: Secondary | ICD-10-CM

## 2016-05-07 DIAGNOSIS — G90A Postural orthostatic tachycardia syndrome (POTS): Secondary | ICD-10-CM

## 2016-05-07 NOTE — Progress Notes (Signed)
Cardiology Office Note   Date:  05/07/2016   ID:  Dustin [name], Dustin [name], Dustin [name], Dustin [name]  PCP:  Gara Kroner, MD  Cardiologist:   Skeet Latch, MD   Chief Complaint  Patient presents with  . Follow-up    no complaints      History of Present Illness: Dustin [name] is a 26 y.o. male with POTS who presents for follow up.  Dustin [name] was seen 12/2015 with dizziness and chest pain.   September 2015 Dustin [name] had an episode of syncope while standing in formation in the [employer].  He developed a headache and then dizziness prior to passing out. He had been standing at attention for approximately 20-30 minutes before the event. Since then he continues to have almost daily dizziness. It occurs both at rest and when standing. He also reports being evaluated by a cardiologist near [facility] where he had a normal echo and wore a 7 day event monitor that was abnormal. He did have symptoms while wearing the monitor but it was unrevealing.  Dustin [name] was evaluated by neurology and treated for headache and low serotonin levels with Cymbalta.  This helped the headaches. However, his dizziness persisted.  He was diagnosed with vertigo and underwent vestibular rehab.   while in rehabilitation he was noted to have drastic changes in his heart rate with standing. His heart rate changde rapidly from 150-50 within seconds. Therefore, he was referred back to cardiology for a second opinion.  He was referred for an exercise Myoview 01/02/16 that revealed LVEF 57% and no ischemia.  Dustin [name] was started on florinef and it was increased to 0.3 mg.  He stopped taking it for one week when he was on vacation and did not notice any changes in his symptoms. Therefore he has not been taking it anymore. He does continue to take propranolol which she thinks this controlled his heart rate well. He continues to have daily episodes of dizziness that occur on and off throughout the day. He denies any syncope.  Episodes last for a few seconds at a time. He denies chest pain or shortness of breath. He also hasn't noted any lower extremity edema, orthopnea, or PND.  He continues to follow up with his neurologist and is scheduled for MRI/MRA next week.    Past Medical History:  Diagnosis Date  . Asthma   . Bilateral headaches 01/27/2015  . Headache   . Hearing loss   . POTS (postural orthostatic tachycardia syndrome) 02/13/2016  . Vertigo     Past Surgical History:  Procedure Laterality Date  . SINUS ENDO W/FUSION       Current Outpatient Prescriptions  Medication Sig Dispense Refill  . DULoxetine HCl 40 MG CPEP Take 80 mg by mouth at bedtime. 2 at night po. 60 capsule 5  . loratadine (CLARITIN) 10 MG tablet TAKE 1 TABLET(10 MG) BY MOUTH DAILY 30 tablet 0  . propranolol (INDERAL) 10 MG tablet Take 1 tablet (10 mg total) by mouth 2 (two) times daily. 60 tablet 6   No current facility-administered medications for this visit.     Allergies:   Biaxin [clarithromycin]; Ceftin [cefuroxime axetil]; Penicillins; and Tetracyclines & related    Social History:  The patient  reports that he quit smoking about 6 years ago. He has never used smokeless tobacco. He reports that he drinks alcohol. He reports that he does not use drugs.   Family History:  The patient's family history  includes Cancer in his father and paternal grandfather; Migraines in his mother.    ROS:  Please see the history of present illness.   Otherwise, review of systems are positive for none.   All other systems are reviewed and negative.    PHYSICAL EXAM: VS:  BP 110/78 (BP Location: Right Arm, Patient Position: Sitting, Cuff Size: Normal)   Pulse 82   Ht 5\' 11"  (1.803 m)   Wt 87.1 kg (192 lb)   BMI 26.78 kg/m  , BMI Body mass index is 26.78 kg/m. GENERAL:  Well appearing HEENT:  Pupils equal round and reactive, fundi not visualized, oral mucosa unremarkable NECK:  No jugular venous distention, waveform within normal  limits, carotid upstroke brisk and symmetric, no bruits LYMPHATICS:  No cervical adenopathy LUNGS:  Clear to auscultation bilaterally HEART:  RRR.  PMI not displaced or sustained,S1 and S2 within normal limits, no S3, no S4, no clicks, no rubs, no murmurs ABD:  Flat, positive bowel sounds normal in frequency in pitch, no bruits, no rebound, no guarding, no midline pulsatile mass, no hepatomegaly, no splenomegaly EXT:  2 plus pulses throughout, no edema, no cyanosis no clubbing SKIN:  No rashes no nodules NEURO:  Cranial nerves II through XII grossly intact, motor grossly intact throughout PSYCH:  Cognitively intact, oriented to person place and time   EKG:  EKG is not ordered today. The ekg ordered 12/31/15 demonstrates sinus rhythm. Rate 67 bpm. It appeared to wave inversion.   Recent Labs: No results found for requested labs within last 8760 hours.    Lipid Panel No results found for: CHOL, TRIG, HDL, CHOLHDL, VLDL, LDLCALC, LDLDIRECT    Wt Readings from Last 3 Encounters:  05/07/16 87.1 kg (192 lb)  04/29/16 88 kg (194 lb)  02/12/16 87.4 kg (192 lb 9.6 oz)      ASSESSMENT AND PLAN:  # POTS: Dustin [name]'s Dizziness did not improve on Florinef so he discontinued it. His palpitations have improved. Orthostatic vital signs were again checked today. There was no significant increase in his heart rate and no drop in his blood pressure. Continue propranolol. He will follow-up with [place] as needed and prefers to follow up with neurology.  # Chest pain: Symptoms have improved and exercise Myoview was negative for ischemia.    Current medicines are reviewed at length with the patient today.  The patient does not have concerns regarding medicines.  The following changes have been made: Stop florinef   Labs/ tests ordered today include:   No orders of the defined types were placed in this encounter.    Disposition:   FU with Tiffany C. Oval Linsey, MD, [facility] as needed   This note was  written with the assistance of speech recognition software.  Please excuse any transcriptional errors.  Signed, Tiffany C. Oval Linsey, MD, [facility]  05/07/2016 4:28 PM    [place]

## 2016-05-07 NOTE — Patient Instructions (Signed)
Medication Instructions:  .Your physician recommends that you continue on your current medications as directed. Please refer to the Current Medication list given to you today.  Labwork: none  Testing/Procedures: none  Follow-Up: As needed   

## 2016-05-12 ENCOUNTER — Ambulatory Visit
Admission: RE | Admit: 2016-05-12 | Discharge: 2016-05-12 | Disposition: A | Discharge status: Home / Self Care | Source: Ambulatory Visit | Attending: Neurology | Admitting: Neurology

## 2016-05-12 DIAGNOSIS — IMO0001 Reserved for inherently not codable concepts without codable children: Secondary | ICD-10-CM

## 2016-05-12 DIAGNOSIS — R000 Tachycardia, unspecified: Secondary | ICD-10-CM

## 2016-05-12 DIAGNOSIS — G90A Postural orthostatic tachycardia syndrome (POTS): Secondary | ICD-10-CM

## 2016-05-12 DIAGNOSIS — H8143 Vertigo of central origin, bilateral: Secondary | ICD-10-CM | POA: Diagnosis not present

## 2016-05-12 DIAGNOSIS — I951 Orthostatic hypotension: Secondary | ICD-10-CM

## 2016-05-12 DIAGNOSIS — I498 Other specified cardiac arrhythmias: Secondary | ICD-10-CM

## 2016-05-12 MED ORDER — GADOBENATE DIMEGLUMINE 529 MG/ML IV SOLN
18.0000 mL | Freq: Once | INTRAVENOUS | Status: AC | PRN
  Administered 2016-05-12: 18 mL via INTRAVENOUS

## 2016-05-20 ENCOUNTER — Telehealth: Payer: Self-pay

## 2016-05-20 NOTE — Telephone Encounter (Signed)
Lm with results below, left call back number for any further questions.

## 2016-05-20 NOTE — Telephone Encounter (Signed)
-----   Message from Larey Seat, MD sent at 05/13/2016  4:13 PM EST ----- Fine looking brain, Vonna Kotyk.  Still no explanation for your ongoing Vertigo. CD

## 2016-05-28 ENCOUNTER — Other Ambulatory Visit: Payer: Self-pay | Admitting: Neurology

## 2016-05-31 NOTE — Telephone Encounter (Signed)
Do you want to continue to refill this?

## 2016-06-29 ENCOUNTER — Other Ambulatory Visit: Payer: Self-pay | Admitting: Neurology

## 2016-07-09 ENCOUNTER — Encounter: Payer: Self-pay | Admitting: Adult Health

## 2016-07-09 ENCOUNTER — Telehealth: Payer: Self-pay | Admitting: Adult Health

## 2016-07-09 DIAGNOSIS — M792 Neuralgia and neuritis, unspecified: Secondary | ICD-10-CM

## 2016-07-09 MED ORDER — DULOXETINE HCL 60 MG PO CPEP: 60.0000 mg | ORAL_CAPSULE | Freq: Every day | ORAL | 3 refills | Status: DC

## 2016-07-09 NOTE — Telephone Encounter (Signed)
There is no evidence that doses higher than 60 mg have additional benefit, but more side effects are reported. I do not recommend higher doses of Cymbalta. CD

## 2016-07-09 NOTE — Telephone Encounter (Addendum)
I will prescribe Cymbalta at 60 mg for him to be taken at night. cymbalta can be take bid or even tid, but a single dose should not exceed 60 mg according to FDA .

## 2016-07-09 NOTE — Telephone Encounter (Signed)
Called wife and informed her of new Rx and Dr Dohmeier's information. Advised she can pick up medication later today. She verbalized understanding, appreciation.

## 2016-07-09 NOTE — Telephone Encounter (Signed)
Called wife and advised her of Dr Dohmeier's message. She stated that he is only taking Cymbalta 40 mg nightly. This RN stated his med list indicated he is taking Cymbalta 40 mg tabs, taking two nightly, script dated Nov 2017. His wife stated he has never taken two tablets at night. She stated her husband is only averaging 4 hours of sleep a night, still having headaches. She is asking what else can be done to help him sleep more hours if the Cymbalta cannot be increased. This RN stated she would send the question to Dr Brett Fairy, however advised she may not get a call back until Monday. She verbalized understanding, appreciation.

## 2016-07-09 NOTE — Addendum Note (Signed)
Addended by: Larey Seat on: 07/09/2016 12:11 PM   Modules accepted: Orders

## 2016-07-09 NOTE — Telephone Encounter (Addendum)
Left voice mail for patient , medication of Cymbalta at 60 mg ready for pick up.  We need to  discuss a possible sleep aid either in a RV or Monday in  Phone conversation- this could not be done by VM.  CD

## 2016-07-09 NOTE — Telephone Encounter (Signed)
Pt's wife called said over the past week he is having trouble sleeping and almost daily bad HA that lasts all day and mood swings. She is wanting to know if the cymbalta could be increased. Please call

## 2016-10-21 ENCOUNTER — Ambulatory Visit: Admitting: Adult Health

## 2016-10-22 ENCOUNTER — Encounter: Payer: Self-pay | Admitting: Adult Health

## 2016-11-30 ENCOUNTER — Telehealth: Payer: Self-pay | Admitting: Neurology

## 2016-11-30 NOTE — Telephone Encounter (Signed)
Called and spoke with the patient and he will bring his papers in to get signed.

## 2016-11-30 NOTE — Telephone Encounter (Signed)
Pt called he is needing a neurology clearance so he can move to [place]. His wife is in the [employer], he needs clearance so she can leave tech school and move. Pt was advised he should schedule an appt but he is wanting to drop off the form he has been given. Please call to discuss asap. He said he needs this done or an appt asap.

## 2017-01-05 ENCOUNTER — Emergency Department (HOSPITAL_COMMUNITY): Payer: Self-pay

## 2017-01-05 ENCOUNTER — Emergency Department (HOSPITAL_COMMUNITY)
Admission: EM | Admit: 2017-01-05 | Discharge: 2017-01-05 | Disposition: A | Discharge status: Home / Self Care | Payer: Self-pay | Attending: Emergency Medicine | Admitting: Emergency Medicine

## 2017-01-05 ENCOUNTER — Encounter (HOSPITAL_COMMUNITY): Payer: Self-pay

## 2017-01-05 DIAGNOSIS — Z87891 Personal history of nicotine dependence: Secondary | ICD-10-CM | POA: Insufficient documentation

## 2017-01-05 DIAGNOSIS — Z79899 Other long term (current) drug therapy: Secondary | ICD-10-CM | POA: Insufficient documentation

## 2017-01-05 DIAGNOSIS — J45909 Unspecified asthma, uncomplicated: Secondary | ICD-10-CM | POA: Insufficient documentation

## 2017-01-05 DIAGNOSIS — R0781 Pleurodynia: Secondary | ICD-10-CM | POA: Insufficient documentation

## 2017-01-05 DIAGNOSIS — R042 Hemoptysis: Secondary | ICD-10-CM | POA: Insufficient documentation

## 2017-01-05 LAB — CBC
HCT: 44.4 % (ref 39.0–52.0)
Hemoglobin: 15.9 g/dL (ref 13.0–17.0)
MCH: 31.7 pg (ref 26.0–34.0)
MCHC: 35.8 g/dL (ref 30.0–36.0)
MCV: 88.6 fL (ref 78.0–100.0)
Platelets: 271 10*3/uL (ref 150–400)
RBC: 5.01 MIL/uL (ref 4.22–5.81)
RDW: 12.3 % (ref 11.5–15.5)
WBC: 6.8 10*3/uL (ref 4.0–10.5)

## 2017-01-05 LAB — I-STAT TROPONIN, ED: Troponin i, poc: 0 ng/mL (ref 0.00–0.08)

## 2017-01-05 LAB — BASIC METABOLIC PANEL
Anion gap: 8 (ref 5–15)
BUN: 12 mg/dL (ref 6–20)
CO2: 27 mmol/L (ref 22–32)
Calcium: 9.7 mg/dL (ref 8.9–10.3)
Chloride: 102 mmol/L (ref 101–111)
Creatinine, Ser: 1.11 mg/dL (ref 0.61–1.24)
GFR calc Af Amer: >60 mL/min (ref >60)
GFR calc non Af Amer: >60 mL/min (ref >60)
Glucose, Bld: 98 mg/dL (ref 65–99)
Potassium: 3.6 mmol/L (ref 3.5–5.1)
Sodium: 137 mmol/L (ref 135–145)

## 2017-01-05 MED ORDER — MELOXICAM 15 MG PO TABS: 15.0000 mg | ORAL_TABLET | Freq: Every day | ORAL | 0 refills | Status: DC

## 2017-01-05 MED ORDER — KETOROLAC TROMETHAMINE 30 MG/ML IJ SOLN
30.0000 mg | Freq: Once | INTRAMUSCULAR | Status: AC
  Administered 2017-01-05: 30 mg via INTRAVENOUS
  Filled 2017-01-05: qty 1

## 2017-01-05 MED ORDER — IOPAMIDOL (ISOVUE-370) INJECTION 76%
INTRAVENOUS | Status: AC
  Administered 2017-01-05: 100 mL
  Filled 2017-01-05: qty 100

## 2017-01-05 NOTE — ED Notes (Signed)
C/o chest pain onset 1 week ago, states pain is worse with exertion. States he coughed up 3 "small shot glass size of blood yest. "

## 2017-01-05 NOTE — ED Provider Notes (Signed)
[facility] DEPT Provider Note   CSN: [id number] Arrival date & time: 01/05/17  1151     History   Chief Complaint Chief Complaint  Patient presents with  . Chest Pain    HPI Dustin [name] is a 26 y.o. male.  HPI Dustin [name] is a 26 y.o. male  With history of asthma,vertigo, presents to emergency department complaining of hemoptysis and left-sided chest pain. E states he has had pain on left side of the chest for about a week where it became worse since yesterday. He denies any cough but states he had a "weird cough episode" yesterday where he coughed up a large amount of bright red blood. He states it was about 3 shock losses of blood. No hemoptysis since then. He states however he is having increased pain in left side of the chest which is worse with deep breathing. He reports some shortness of breath. Denies any exertional symptoms. Denies any swelling in his legs. No recent travel or surgeries. 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 Patient does not smoke cigarettes currently. No history of some symptoms in the past. No family history of cardiac problems.  Past Medical History:  Diagnosis Date  . Asthma   . Bilateral headaches 01/27/2015  . Headache   . Hearing loss   . POTS (postural orthostatic tachycardia syndrome) 02/13/2016  . Vertigo     Patient Active Problem List   Diagnosis Date Noted  . POTS (postural orthostatic tachycardia syndrome) 02/13/2016  . Epistaxis 10/28/2015  . Bilateral headaches 01/27/2015  . Vertigo, central 01/27/2015  . Vertigo of central origin 09/10/2014  . Episodic recurrent vertigo 08/12/2014    Past Surgical History:  Procedure Laterality Date  . SINUS ENDO W/FUSION         Home Medications    Prior to Admission medications   Medication Sig Start Date End Date Taking? Authorizing Provider  DULoxetine (CYMBALTA) 60 MG capsule Take 1 capsule (60 mg total) by mouth daily. 07/09/16   Dohmeier,  Asencion Partridge, MD  loratadine (CLARITIN) 10 MG tablet TAKE 1 TABLET(10 MG) BY MOUTH DAILY 06/29/16   Dohmeier, Asencion Partridge, MD  propranolol (INDERAL) 10 MG tablet Take 1 tablet (10 mg total) by mouth 2 (two) times daily. 02/12/16   Skeet Latch, MD    Family History Family History  Problem Relation Age of Onset  . Cancer Father        throat   . Migraines Mother   . Cancer Paternal Grandfather     Social History Social History  Substance Use Topics  . Smoking status: Former Smoker    Quit date: 04/05/2010  . Smokeless tobacco: Never Used     Comment: Uses vapor  . Alcohol use 0.0 oz/week     Comment: social     Allergies   Biaxin [clarithromycin]; Ceftin [cefuroxime axetil]; Penicillins; and Tetracyclines & related   Review of Systems Review of Systems  Constitutional: Negative for chills and fever.  Respiratory: Positive for chest tightness and shortness of breath. Negative for cough.   Cardiovascular: Positive for chest pain. Negative for palpitations and leg swelling.  Gastrointestinal: Negative for abdominal distention, abdominal pain, diarrhea, nausea and vomiting.  Genitourinary: Negative for dysuria, frequency, hematuria and urgency.  Musculoskeletal: Negative for arthralgias, myalgias, neck pain and neck stiffness.  Skin: Negative for rash.  Allergic/Immunologic: Negative for immunocompromised state.  Neurological: Negative for dizziness, weakness, light-headedness, numbness and headaches.  All other systems reviewed and  are negative.    Physical Exam Updated Vital Signs BP 121/90   Pulse 100   Temp 98.6 F (37 C) (Oral)   Resp 18   SpO2 97%   Physical Exam  Constitutional: He appears well-developed and well-nourished. No distress.  HENT:  Head: Normocephalic and atraumatic.  Eyes: Conjunctivae are normal.  Neck: Neck supple.  Cardiovascular: Normal rate, regular rhythm and normal heart sounds.   Pulmonary/Chest: Effort normal. No respiratory distress. He has  no wheezes. He has no rales. He exhibits no tenderness.  Abdominal: Soft. Bowel sounds are normal. He exhibits no distension. There is no tenderness. There is no rebound.  Musculoskeletal: He exhibits no edema.  Neurological: He is alert.  Skin: Skin is warm and dry.  Nursing note and vitals reviewed.    ED Treatments / Results  Labs (all labs ordered are listed, but only abnormal results are displayed) Labs Reviewed  BASIC METABOLIC PANEL  CBC  I-STAT TROPONIN, ED    EKG  EKG Interpretation  Date/Time:  Wednesday January 05 2017 11:52:26 EDT Ventricular Rate:  96 PR Interval:  130 QRS Duration: 78 QT Interval:  316 QTC Calculation: 399 R Axis:   78 Text Interpretation:  Normal sinus rhythm T wave abnormality, consider inferior ischemia Abnormal ECG Confirmed by Lacretia Leigh ([id number]) on 01/05/2017 1:43:57 PM       Radiology Dg Chest 2 View  Result Date: 01/05/2017 CLINICAL DATA:  Chest pain EXAM: CHEST  2 VIEW COMPARISON:  None FINDINGS: The heart size and mediastinal contours are within normal limits. Both lungs are clear. The visualized skeletal structures are unremarkable. IMPRESSION: No active cardiopulmonary disease. Electronically Signed   By: Kerby Moors M.D.   On: 01/05/2017 12:34    Procedures Procedures (including critical care time)  Medications Ordered in ED Medications  iopamidol (ISOVUE-370) 76 % injection (not administered)  ketorolac (TORADOL) 30 MG/ML injection 30 mg (not administered)     Initial Impression / Assessment and Plan / ED Course  I have reviewed the triage vital signs and the nursing notes.  Pertinent labs & imaging results that were available during my care of the patient were reviewed by me and considered in my medical decision making (see chart for details).     Patient with diffuse T-wave inversion EKG, otherwise unremarkable labs and chest x-ray. Patient's symptoms are concerning for large amount of hemoptysis and  persistent chest pain. Will get CT angiography for further evaluation.  Vitals:   01/05/17 1157  BP: 121/90  Pulse: 100  Resp: 18  Temp: 98.6 F (37 C)  TempSrc: Oral  SpO2: 97%   PT signed out to PA Margarita Mail pending CT angio. Disposition based on results.   Final Clinical Impressions(s) / ED Diagnoses   Final diagnoses:  None    New Prescriptions New Prescriptions   No medications on file     Jeannett Senior, Hershal Coria 01/06/17 9211    Lacretia Leigh, MD 01/06/17 [phone]

## 2017-01-05 NOTE — ED Triage Notes (Signed)
Patient complains of chest pain that he describes as tightness for over 1 week, denies cough, congestion. Reports that he has some general weakness with same

## 2017-01-05 NOTE — ED Provider Notes (Signed)
4:04 PM BP 121/90   Pulse 100   Temp 98.6 F (37 C) (Oral)   Resp 18   SpO2 97%   Patient taken in sign out from PA [place] for several days of pleuritic chest pain. Patient also complained of hemoptysis\   Patient CT scan negative for any abnormalities. He'll be discharged with symptomatic treatment for chest wall pain. No active hemoptysis here in the ED, hemodynamically stable, follow up with PCP.Marland Kitchen   Margarita Mail, PA-C 01/06/17 0050    Rex Kras Wenda Overland, MD 01/10/17 1501

## 2017-01-05 NOTE — Discharge Instructions (Signed)
Your chest x-ray and CT scan showed no acute abnormalities.  You have been diagnosed by your caregiver as having chest wall pain. SEEK IMMEDIATE MEDICAL ATTENTION IF: You develop a fever.  Your chest pains become severe or intolerable.  You develop new, unexplained symptoms (problems).  You develop shortness of breath, nausea, vomiting, sweating or feel light headed.  You develop a new cough or you cough up blood.

## 2017-01-27 ENCOUNTER — Ambulatory Visit: Admitting: Neurology

## 2017-02-04 ENCOUNTER — Telehealth: Payer: Self-pay | Admitting: Neurology

## 2017-02-04 ENCOUNTER — Ambulatory Visit: Payer: Self-pay | Admitting: Neurology

## 2017-02-04 NOTE — Telephone Encounter (Signed)
Pt no show to apt today

## 2017-02-07 ENCOUNTER — Encounter: Payer: Self-pay | Admitting: Neurology

## 2017-04-28 ENCOUNTER — Other Ambulatory Visit: Payer: Self-pay | Admitting: Physician Assistant

## 2017-04-28 DIAGNOSIS — M799 Soft tissue disorder, unspecified: Secondary | ICD-10-CM

## 2017-05-03 ENCOUNTER — Ambulatory Visit
Admission: RE | Admit: 2017-05-03 | Discharge: 2017-05-03 | Disposition: A | Discharge status: Home / Self Care | Payer: Self-pay | Source: Ambulatory Visit | Attending: Physician Assistant | Admitting: Physician Assistant

## 2017-05-03 DIAGNOSIS — M799 Soft tissue disorder, unspecified: Secondary | ICD-10-CM

## 2017-06-08 ENCOUNTER — Other Ambulatory Visit (HOSPITAL_COMMUNITY): Payer: Self-pay | Admitting: Gastroenterology

## 2017-06-08 DIAGNOSIS — R112 Nausea with vomiting, unspecified: Secondary | ICD-10-CM

## 2017-06-15 ENCOUNTER — Ambulatory Visit (HOSPITAL_COMMUNITY)
Admission: RE | Admit: 2017-06-15 | Discharge: 2017-06-15 | Disposition: A | Discharge status: Home / Self Care | Source: Ambulatory Visit | Attending: Gastroenterology | Admitting: Gastroenterology

## 2017-06-15 DIAGNOSIS — R112 Nausea with vomiting, unspecified: Secondary | ICD-10-CM

## 2017-06-15 MED ORDER — TECHNETIUM TC 99M SULFUR COLLOID
2.0000 | Freq: Once | INTRAVENOUS | Status: AC | PRN
  Administered 2017-06-15: 2 via ORAL

## 2018-12-05 ENCOUNTER — Encounter: Payer: Self-pay | Admitting: Family Medicine

## 2018-12-05 ENCOUNTER — Other Ambulatory Visit: Payer: Self-pay | Admitting: Family Medicine

## 2018-12-05 ENCOUNTER — Ambulatory Visit (INDEPENDENT_AMBULATORY_CARE_PROVIDER_SITE_OTHER): Payer: PRIVATE HEALTH INSURANCE | Admitting: Family Medicine

## 2018-12-05 DIAGNOSIS — G90A Postural orthostatic tachycardia syndrome (POTS): Secondary | ICD-10-CM

## 2018-12-05 DIAGNOSIS — R519 Headache, unspecified: Secondary | ICD-10-CM

## 2018-12-05 DIAGNOSIS — M25569 Pain in unspecified knee: Secondary | ICD-10-CM

## 2018-12-05 DIAGNOSIS — I951 Orthostatic hypotension: Secondary | ICD-10-CM

## 2018-12-05 DIAGNOSIS — R454 Irritability and anger: Secondary | ICD-10-CM

## 2018-12-05 DIAGNOSIS — R51 Headache: Secondary | ICD-10-CM

## 2018-12-05 DIAGNOSIS — D179 Benign lipomatous neoplasm, unspecified: Secondary | ICD-10-CM

## 2018-12-05 DIAGNOSIS — R05 Cough: Secondary | ICD-10-CM | POA: Diagnosis not present

## 2018-12-05 DIAGNOSIS — J309 Allergic rhinitis, unspecified: Secondary | ICD-10-CM | POA: Insufficient documentation

## 2018-12-05 DIAGNOSIS — I498 Other specified cardiac arrhythmias: Secondary | ICD-10-CM

## 2018-12-05 DIAGNOSIS — R000 Tachycardia, unspecified: Secondary | ICD-10-CM

## 2018-12-05 DIAGNOSIS — R059 Cough, unspecified: Secondary | ICD-10-CM

## 2018-12-05 MED ORDER — AZELASTINE HCL 0.1 % NA SOLN: 2.0000 | Freq: Two times a day (BID) | NASAL | 12 refills | Status: DC

## 2018-12-05 MED ORDER — PANTOPRAZOLE SODIUM 40 MG PO TBEC: 40.0000 mg | DELAYED_RELEASE_TABLET | Freq: Every day | ORAL | 5 refills | Status: DC

## 2018-12-05 NOTE — Assessment & Plan Note (Signed)
Stable.  Continue management per neurology. 

## 2018-12-05 NOTE — Progress Notes (Signed)
Chief Complaint:  Dustin [name] is a 28 y.o. male who presents today for a virtual office visit with a chief complaint of knee pain and to establish care.   Assessment/Plan:  Cough No red flags.  Likely postnasal drip related to allergies.  Will start Astelin nasal spray.  He also has some reflux symptoms which could be contributing as well.  If no improvement on Astelin, will try Protonix.  If continues to have issues despite above, will need chest x-ray.  Knee pain Likely mild contusion.  Improving.  Continue with watchful waiting.  Anticipate full improvement over the next couple of weeks.  Lipoma Place order for ultrasound.  POTS (postural orthostatic tachycardia syndrome) Stable.  Continue management per cardiology.  Bilateral headaches Stable.  Continue management per neurology.  Allergic rhinitis Start Astelin as above.  Continue Claritin.  Will place referral to allergy for allergy testing per patient request.  Irritability Continue Cymbalta 60 mg daily.     Subjective:  HPI:  Cough Symptoms started 5 months ago.  Got a new house daughter over that time and thinks that this could be the main contributing factor.  He has not had any sort of wheeze, chest pain, shortness of breath, or difficulty breathing.  Cough is nonproductive.  Symptoms seem to be getting a little bit worse.  No rhinorrhea.  No sinus congestion.  Takes Claritin with no significant improvement.  Occasionally has reflux depending on what he eats.  No other obvious alleviating or aggravating factors.  He has also had some knee pain over the last couple weeks.  He was recently ill and lost balance and hit his knee on the door.  Since then he has had some pain directly on his kneecap.  This pain seems to be improving.  Is responded well to Tylenol.  Pain is currently rated as a 2 out of 10.  Patient also has a fatty tissue growth on his right rib cage.  He was told in the past that this was a lipoma.  Is  grown in size and become more sore over the last few weeks.  He would like to have an ultrasound done.  His stable, chronic medical conditions are outlined below:  # Depression / Anxiety / Irritability - On cymbalta 60mg  daily and tolerating well  # Allergic Rhinitis - On claritin 10mg  daily  # POTS - Has followed with Cardiology in the past  # Migraines - Follows with neurology  ROS: Per HPI, otherwise a complete review of systems was negative.   PMH:  The following were reviewed and entered/updated in epic: Past Medical History:  Diagnosis Date  . Asthma   . Bilateral headaches 01/27/2015  . Headache   . Hearing loss   . POTS (postural orthostatic tachycardia syndrome) 02/13/2016  . Vertigo    Patient Active Problem List   Diagnosis Date Noted  . Irritability 12/05/2018  . Allergic rhinitis 12/05/2018  . POTS (postural orthostatic tachycardia syndrome) 02/13/2016  . Epistaxis 10/28/2015  . Bilateral headaches 01/27/2015   Past Surgical History:  Procedure Laterality Date  . INGUINAL HERNIA REPAIR    . SINUS ENDO W/FUSION      Family History  Problem Relation Age of Onset  . Cancer Father        throat   . Migraines Mother   . Cancer Paternal Grandfather     Medications- reviewed and updated Current Outpatient Medications  Medication Sig Dispense Refill  . azelastine (ASTELIN) 0.1 %  nasal spray Place 2 sprays into both nostrils 2 (two) times daily. 30 mL 12  . DULoxetine (CYMBALTA) 60 MG capsule Take 1 capsule (60 mg total) by mouth daily. 90 capsule 3  . loratadine (CLARITIN) 10 MG tablet TAKE 1 TABLET(10 MG) BY MOUTH DAILY 30 tablet 0  . pantoprazole (PROTONIX) 40 MG tablet Take 1 tablet (40 mg total) by mouth daily. 30 tablet 5   No current facility-administered medications for this visit.     Allergies-reviewed and updated Allergies  Allergen Reactions  . Biaxin [Clarithromycin] Hives  . Ceftin [Cefuroxime Axetil] Hives  . Penicillins Hives     Has patient had a PCN reaction causing immediate rash, facial/tongue/throat swelling, SOB or lightheadedness with hypotension: Yes Has patient had a PCN reaction causing severe rash involving mucus membranes or skin necrosis: Yes Has patient had a PCN reaction that required hospitalization: No Has patient had a PCN reaction occurring within the last 10 years: No If all of the above answers are "NO", then may proceed with Cephalosporin use.   . Tetracyclines & Related Hives    Social History   Socioeconomic History  . Marital status: Married    Spouse name: [name]  . Number of children: 1  . Years of education: [employer]  . Highest education level: Not on file  Occupational History  . Not on file  Social Needs  . Financial resource strain: Not on file  . Food insecurity    Worry: Not on file    Inability: Not on file  . Transportation needs    Medical: Not on file    Non-medical: Not on file  Tobacco Use  . Smoking status: Former Smoker    Quit date: 04/05/2010    Years since quitting: 8.6  . Smokeless tobacco: Never Used  . Tobacco comment: Uses vapor  Substance and Sexual Activity  . Alcohol use: Yes    Alcohol/week: 0.0 standard drinks    Comment: social  . Drug use: No  . Sexual activity: Yes    Partners: Female  Lifestyle  . Physical activity    Days per week: Not on file    Minutes per session: Not on file  . Stress: Not on file  Relationships  . Social Herbalist on phone: Not on file    Gets together: Not on file    Attends religious service: Not on file    Active member of club or organization: Not on file    Attends meetings of clubs or organizations: Not on file    Relationship status: Not on file  Other Topics Concern  . Not on file  Social History Narrative   Patient lives at home with his wife [name]).   Patient is in the [employer]    Right handed.   Caffeine sweet tea once daily.          Objective/Observations  Physical Exam:  Gen: NAD, resting comfortably HEENT: Extraocular eye movements intact.  Eyes with no scleral icterus CV: No peripheral edema Pulm: Normal work of breathing MSK: Moves all extremities.  No digital cyanosis Skin: No obvious rashes or lesions. Neuro: Grossly normal, moves all extremities Psych: Normal affect and thought content  Virtual Visit via Video   I connected with Annice Pih on 12/05/18 at  2:40 PM EDT by a video enabled telemedicine application and verified that I am speaking with the correct person using two identifiers. I discussed the limitations of evaluation and  management by telemedicine and the availability of in person appointments. The patient expressed understanding and agreed to proceed.   Patient location: Home Provider location: [place] participating in the virtual visit: Myself and Patient     Algis Greenhouse. Jerline Pain, MD 12/05/2018 3:44 PM

## 2018-12-05 NOTE — Assessment & Plan Note (Signed)
Continue Cymbalta 60 mg daily. 

## 2018-12-05 NOTE — Assessment & Plan Note (Signed)
Start Astelin as above.  Continue Claritin.  Will place referral to allergy for allergy testing per patient request.

## 2018-12-05 NOTE — Assessment & Plan Note (Signed)
Stable.  Continue management per cardiology. 

## 2018-12-28 ENCOUNTER — Encounter: Payer: Self-pay | Admitting: Allergy & Immunology

## 2018-12-28 ENCOUNTER — Ambulatory Visit (INDEPENDENT_AMBULATORY_CARE_PROVIDER_SITE_OTHER): Payer: PRIVATE HEALTH INSURANCE | Admitting: Allergy & Immunology

## 2018-12-28 ENCOUNTER — Other Ambulatory Visit: Payer: Self-pay

## 2018-12-28 VITALS — BP 120/78 | HR 85 | Temp 98.2°F | Resp 16 | Ht 70.0 in | Wt 174.0 lb

## 2018-12-28 DIAGNOSIS — J310 Chronic rhinitis: Secondary | ICD-10-CM

## 2018-12-28 DIAGNOSIS — J4520 Mild intermittent asthma, uncomplicated: Secondary | ICD-10-CM | POA: Diagnosis not present

## 2018-12-28 MED ORDER — TRIAMCINOLONE ACETONIDE 55 MCG/ACT NA AERO: 2.0000 | INHALATION_SPRAY | Freq: Every day | NASAL | 5 refills | Status: DC

## 2018-12-28 MED ORDER — LEVOCETIRIZINE DIHYDROCHLORIDE 5 MG PO TABS: 5.0000 mg | ORAL_TABLET | Freq: Every evening | ORAL | 5 refills | Status: DC

## 2018-12-28 MED ORDER — MONTELUKAST SODIUM 10 MG PO TABS: 10.0000 mg | ORAL_TABLET | Freq: Every day | ORAL | 5 refills | Status: DC

## 2018-12-28 NOTE — Progress Notes (Signed)
NEW PATIENT  Date of Service/Encounter:  12/28/18  Referring provider: Vivi Barrack, MD   Assessment:   Mild intermittent asthma, uncomplicated  Chronic rhinitis - ? local allergic rhinitis   Dustin [name] presents for an evaluation of allergic rhinitis which seems to have started when he adopted a dog.  Unfortunately, the testing today is completely unrevealing.  Despite having an excellent histamine, he was not reactive to the entire panel, including intradermal.  With a sensitivity of intradermal's, this likely rules out allergies as a cause of his symptoms.  We could certainly get blood testing, but he was unsure of his co-pay for blood work.  In light of that, we are going to treat with a nasal steroid, add on montelukast, and change his Claritin to Allegra.  He will give [place] an update in 2 weeks and we will make decisions based on that.  While this certainly could be a manifestation of local allergic rhinitis, we cannot diagnose that in the clinical setting at this time so we will just go ahead and treat aggressively with medicines.   Plan/Recommendations:   1. Mild intermittent asthma, uncomplicated - We did not do lung testing since this is did not seem to be much of a problem. - There does not seem to be the need for a controller medication at this time. - Continue with albuterol 4 puffs every 4-6 hours as needed.   2. Chronic rhinitis - Testing today showed: negative to the entire panel (prick testing and intradermal testing). - This likely rules out an allergy. - Copy of test results provided.  - Avoidance measures provided. - This may be what is known as "local allergic rhinitis", which occurs when your immune system makes localized IgE (allergy antibodies) to various allergens (as opposed to systemic) IgE - Therefore, since IgE is not make systemically, allergic sensitizations are not picked up on routine skin testing. - There are specialized academic research centers that  can test for IgE in the nasal cavity, but this is not done in the clinical setting at this time.  - It can still be treated with nasal sprays and antihistamines.   - Stop taking: Claritin - Start taking: Xyzal (levocetirizine) 5mg  tablet once daily, Singulair (montelukast) 10mg  daily and Nasacort (triamcinolone) one spray per nostril daily  - Singulair (montelukast) can cause increased nightmares, so if you are experiencing that, please stop the medication and let us know.  - You can use an extra dose of the antihistamine, if needed, for breakthrough symptoms.  - Consider nasal saline rinses 1-2 times daily to remove allergens from the nasal cavities as well as help with mucous clearance (this is especially helpful to do before the nasal sprays are given) - Give me a MyChart update in two weeks to let me know how things are going.   3. Return in about 3 months (around 03/29/2019). This can be an in-person, a virtual Webex or a telephone follow up visit.   Subjective:   Dustin [name] is a 28 y.o. male presenting today for evaluation of  Chief Complaint  Patient presents with   Allergy Testing    [name] has a history of the following: Patient Active Problem List   Diagnosis Date Noted   Irritability 12/05/2018   Allergic rhinitis 12/05/2018   POTS (postural orthostatic tachycardia syndrome) 02/13/2016   Epistaxis 10/28/2015   Bilateral headaches 01/27/2015    History obtained from: chart review and patient.  Dustin [name] was  referred by Vivi Barrack, MD.     Dustin [name] is a 28 y.o. male presenting for an evaluation of allergies.   Asthma/Respiratory Symptom History: He has albuterol to use as needed. He estimates that he uses albuterol around twice per month. He has never needed to go to the hospital for his asthma.  He has never been admitted.  He has never been on a daily controller medication for his asthma.  Allergic Rhinitis Symptom History: He did not have  any issues until he got his dog (Rotweiller mix). He has had a constant cough since that time. He got the dog five months ago. He does endorse postnasal drip. He mostly has nasal symptoms and sneezing. The itchy throat is the worst part. He probably had seasonal allergies but he has never taken anything for them. He takes Claritin every day in the morning.  He was prescribed Astelin by his physician, but he does not feel that it helped much.  He has never been allergy tested in the past.  He does not remember the last time that he got antibiotics for any kind of infection.  Eczema Symptom History: He did have eczema as a child.  This is not been a problem in 15 or 20 years.  He has a history of POTS which was diagnosed around five years. 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  Otherwise, there is no history of other atopic diseases, including food allergies, drug allergies, stinging insect allergies, urticaria or contact dermatitis. There is no significant infectious history. Vaccinations are up to date.    Past Medical History: Patient Active Problem List   Diagnosis Date Noted   Irritability 12/05/2018   Allergic rhinitis 12/05/2018   POTS (postural orthostatic tachycardia syndrome) 02/13/2016   Epistaxis 10/28/2015   Bilateral headaches 01/27/2015    Medication List:  Allergies as of 12/28/2018      Reactions   Biaxin [clarithromycin] Hives   Ceftin [cefuroxime Axetil] Hives   Penicillins Hives   Has patient had a PCN reaction causing immediate rash, facial/tongue/throat swelling, SOB or lightheadedness with hypotension: Yes Has patient had a PCN reaction causing severe rash involving mucus membranes or skin necrosis: Yes Has patient had a PCN reaction that required hospitalization: No Has patient had a PCN reaction  occurring within the last 10 years: No If all of the above answers are "NO", then may proceed with Cephalosporin use.   Tetracyclines & Related Hives      Medication List       Accurate as of December 28, 2018 10:23 AM. If you have any questions, ask your nurse or doctor.        azelastine 0.1 % nasal spray Commonly known as: ASTELIN Place 2 sprays into both nostrils 2 (two) times daily.   DULoxetine 60 MG capsule Commonly known as: Cymbalta Take 1 capsule (60 mg total) by mouth daily.   loratadine 10 MG tablet Commonly known as: CLARITIN TAKE 1 TABLET(10 MG) BY MOUTH DAILY   pantoprazole 40 MG tablet Commonly known as: Protonix Take 1 tablet (40 mg total) by mouth daily.       Birth History: non-contributory  Developmental History: non-contributory  Past Surgical History: Past Surgical History:  Procedure Laterality Date   INGUINAL HERNIA REPAIR     SINUS ENDO W/FUSION  Family History: Family History  Problem Relation Age of Onset   Cancer Father        throat    Migraines Mother    Cancer Paternal Grandfather      Social History: Jakari lives at home with his dog.  He was in a house that was built in the 1950s.  He did have some mold issues when he got it but he remodeled that and the mold was remediated.  He has hardwood throughout the home.  He has gas heating and central cooling.  There are no dust mite covers on the bedding.  There is tobacco exposure in the car.  He does smoke about half a pack per day for the past 6 years.  He is exposed to dust and other chemicals at his workplace.   Review of Systems  Constitutional: Negative.  Negative for chills, fever, malaise/fatigue and weight loss.  HENT: Negative.  Negative for congestion, ear discharge, ear pain and sore throat.   Eyes: Negative for pain, discharge and redness.  Respiratory: Negative for cough, sputum production, shortness of breath and wheezing.   Cardiovascular: Negative.   Negative for chest pain and palpitations.  Gastrointestinal: Negative for abdominal pain, constipation, diarrhea, heartburn, nausea and vomiting.  Skin: Negative.  Negative for itching and rash.  Neurological: Negative for dizziness and headaches.  Endo/Heme/Allergies: Positive for environmental allergies. Does not bruise/bleed easily.       Objective:   Blood pressure 120/78, pulse 85, temperature 98.2 F (36.8 C), temperature source Temporal, resp. rate 16, height 5\' 10"  (1.778 m), weight 174 lb (78.9 kg), SpO2 97 %. Body mass index is 24.97 kg/m.   Physical Exam:   Physical Exam  Constitutional: He appears well-developed.  Subdued but responsive to questions.  HENT:  Head: Normocephalic and atraumatic.  Right Ear: Tympanic membrane and ear canal normal. No drainage, swelling or tenderness. Tympanic membrane is not injected, not scarred, not erythematous, not retracted and not bulging.  Left Ear: Tympanic membrane and ear canal normal. No drainage, swelling or tenderness. Tympanic membrane is not injected, not scarred, not erythematous, not retracted and not bulging.  Nose: Mucosal edema and rhinorrhea present. No nasal deformity or septal deviation. No epistaxis. Right sinus exhibits no maxillary sinus tenderness and no frontal sinus tenderness. Left sinus exhibits no maxillary sinus tenderness and no frontal sinus tenderness.  Mouth/Throat: Uvula is midline and oropharynx is clear and moist. Mucous membranes are not pale and not dry.  No polyps appreciated.  Turbinates have dried rhinorrhea at the openings.  There is cobblestoning.  Tonsils are 2+ bilaterally without discharge.  Eyes: Pupils are equal, round, and reactive to light. Conjunctivae and EOM are normal. Right eye exhibits no chemosis and no discharge. Left eye exhibits no chemosis and no discharge. Right conjunctiva is not injected. Left conjunctiva is not injected.  Cardiovascular: Normal rate, regular rhythm and normal  heart sounds.  Respiratory: Effort normal and breath sounds normal. No accessory muscle usage. No tachypnea. No respiratory distress. He has no wheezes. He has no rhonchi. He has no rales. He exhibits no tenderness.  Moving air well in all lung fields.   GI: There is no abdominal tenderness. There is no rebound and no guarding.  Lymphadenopathy:       Head (right side): No submandibular, no tonsillar and no occipital adenopathy present.       Head (left side): No submandibular, no tonsillar and no occipital adenopathy present.    He has no  cervical adenopathy.  Neurological: He is alert.  Skin: No abrasion, no petechiae and no rash noted. Rash is not papular, not vesicular and not urticarial. No erythema. No pallor.  Tattoos on both arms.  Psychiatric: He has a normal mood and affect.     Diagnostic studies:   Allergy Studies:    Airborne Adult Perc - 12/28/18 0949    Time Antigen Placed  0949    Allergen Manufacturer  Lavella Hammock    Location  Back    Number of Test  59    Panel 1  Select    1. Control-Buffer 50% Glycerol  Negative    2. Control-Histamine 1 mg/ml  2+    3. Albumin saline  Negative    4. Floris  Negative    5. [place]  Negative    6. Johnson  Negative    7. Pinedale Blue  Negative    8. Meadow Fescue  Negative    9. Perennial Rye  Negative    10. Sweet Vernal  Negative    11. Timothy  Negative    12. Cocklebur  Negative    13. Burweed Marshelder  Negative    14. Ragweed, short  Negative    15. Ragweed, Giant  Negative    16. Plantain,  English  Negative    17. Lamb's Quarters  Negative    18. Sheep Sorrell  Negative    19. Rough Pigweed  Negative    20. Marsh Elder, Rough  Negative    21. Mugwort, Common  Negative    22. Ash mix  Negative    23. Birch mix  Negative    24. Beech American  Negative    25. Box, Elder  Negative    26. Cedar, red  Negative    27. Cottonwood, [place]  Negative    28. Elm mix  Negative    29. Hickory mix  Negative    30. Maple  mix  Negative    31. Oak, [place] mix  Negative    32. Pecan Pollen  Negative    33. Pine mix  Negative    34. Sycamore Eastern  Negative    35. [place], Black Pollen  Negative    36. Alternaria alternata  Negative    37. Cladosporium Herbarum  Negative    38. Aspergillus mix  Negative    39. Penicillium mix  Negative    40. Bipolaris sorokiniana (Helminthosporium)  Negative    41. Drechslera spicifera (Curvularia)  Negative    42. Mucor plumbeus  Negative    43. Fusarium moniliforme  Negative    44. Aureobasidium pullulans (pullulara)  Negative    45. Rhizopus oryzae  Negative    46. Botrytis cinera  Negative    47. Epicoccum nigrum  Negative    48. Phoma betae  Negative    49. Candida Albicans  Negative    50. Trichophyton mentagrophytes  Negative    51. Mite, D Farinae  5,000 AU/ml  Negative    52. Mite, D Pteronyssinus  5,000 AU/ml  Negative    53. Cat Hair 10,000 BAU/ml  Negative    54.  Dog Epithelia  Negative    55. Mixed Feathers  Negative    56. Horse Epithelia  Negative    57. Cockroach, German  Negative    58. Mouse  Negative    59. Tobacco Leaf  Negative     Intradermal - 12/28/18 0949    Time Antigen Placed  [id number]    Allergen Manufacturer  Lavella Hammock    Location  Arm    Number of Test  15    Control  Negative    [place]atem[place]  Negative    Johnson  Negative    7 Grass  Negative    Ragweed mix  Negative    Weed mix  Negative    Tree mix  Negative    Mold 1  Negative    Mold 2  Negative    Mold 3  Negative    Mold 4  Negative    Cat  Negative    Dog  Negative    Cockroach  Negative    Mite mix  Negative       Allergy testing results were read and interpreted by myself, documented by clinical staff.         Salvatore Marvel, MD All[facility] [place] [facility] [place]

## 2018-12-28 NOTE — Patient Instructions (Addendum)
1. Mild intermittent asthma, uncomplicated - We did not do lung testing since this is did not seem to be much of a problem. - There does not seem to be the need for a controller medication at this time. - Continue with albuterol 4 puffs every 4-6 hours as needed.   2. Chronic rhinitis - Testing today showed: negative to the entire panel (prick testing and intradermal testing). - This likely rules out an allergy. - Copy of test results provided.  - Avoidance measures provided. - Stop taking: Claritin - Start taking: Xyzal (levocetirizine) 5mg  tablet once daily, Singulair (montelukast) 10mg  daily and Nasacort (triamcinolone) one spray per nostril daily  - Singulair (montelukast) can cause increased nightmares, so if you are experiencing that, please stop the medication and let us know.  - You can use an extra dose of the antihistamine, if needed, for breakthrough symptoms.  - Consider nasal saline rinses 1-2 times daily to remove allergens from the nasal cavities as well as help with mucous clearance (this is especially helpful to do before the nasal sprays are given) - Give me a MyChart update in two weeks to let me know how things are going.   3. Return in about 3 months (around 03/29/2019). This can be an in-person, a virtual Webex or a telephone follow up visit.   Please inform us of any Emergency Department visits, hospitalizations, or changes in symptoms. Call us before going to the ED for breathing or allergy symptoms since we might be able to fit you in for a sick visit. Feel free to contact us anytime with any questions, problems, or concerns.  It was a pleasure to meet you today!  Websites that have reliable patient information: 1. American Academy of Asthma, Allergy, and Immunology: www.aaaai.org 2. Food Allergy Research and Education (FARE): foodallergy.org 3. Mothers of Asthmatics: http://www.asthmacommunitynetwork.org 4. American College of Allergy, Asthma, and Immunology:  www.acaai.org  "Like" [place] on Facebook and Instagram for our latest updates!      Make sure you are registered to vote! If you have moved or changed any of your contact information, you will need to get this updated before voting!  In some cases, you MAY be able to register to vote online: [email or web address]    Voter ID laws are NOT going into effect for the General Election in November 2020! DO NOT let this stop you from exercising your right to vote!   Absentee voting is the SAFEST way to vote during the coronavirus pandemic!   Download and print an absentee ballot request form at [email or web address] or you can scan the QR code below with your smart phone:      More information on absentee ballots can be found here: [email or web address]    Nonallergic Rhinitis At least one out of three people with rhinitis symptoms do not have allergies. Nonallergic rhinitis usually afflicts adults and causes year-round symptoms, especially runny nose and nasal congestion. This condition differs from allergic rhinitis because the immune system is not involved.

## 2019-02-23 ENCOUNTER — Other Ambulatory Visit: Payer: Self-pay

## 2019-02-26 ENCOUNTER — Ambulatory Visit (INDEPENDENT_AMBULATORY_CARE_PROVIDER_SITE_OTHER): Payer: PRIVATE HEALTH INSURANCE

## 2019-02-26 ENCOUNTER — Encounter: Payer: Self-pay | Admitting: Family Medicine

## 2019-02-26 ENCOUNTER — Other Ambulatory Visit (HOSPITAL_COMMUNITY)
Admission: RE | Admit: 2019-02-26 | Discharge: 2019-02-26 | Disposition: A | Discharge status: Home / Self Care | Payer: PRIVATE HEALTH INSURANCE | Source: Ambulatory Visit | Attending: Family Medicine | Admitting: Family Medicine

## 2019-02-26 ENCOUNTER — Other Ambulatory Visit: Payer: Self-pay

## 2019-02-26 ENCOUNTER — Ambulatory Visit (INDEPENDENT_AMBULATORY_CARE_PROVIDER_SITE_OTHER): Payer: PRIVATE HEALTH INSURANCE | Admitting: Family Medicine

## 2019-02-26 VITALS — BP 124/77 | HR 85 | Temp 98.6°F | Ht 70.0 in | Wt 171.2 lb

## 2019-02-26 DIAGNOSIS — Z23 Encounter for immunization: Secondary | ICD-10-CM

## 2019-02-26 DIAGNOSIS — R059 Cough, unspecified: Secondary | ICD-10-CM

## 2019-02-26 DIAGNOSIS — Z113 Encounter for screening for infections with a predominantly sexual mode of transmission: Secondary | ICD-10-CM | POA: Insufficient documentation

## 2019-02-26 DIAGNOSIS — R358 Other polyuria: Secondary | ICD-10-CM | POA: Diagnosis not present

## 2019-02-26 DIAGNOSIS — R197 Diarrhea, unspecified: Secondary | ICD-10-CM | POA: Diagnosis not present

## 2019-02-26 DIAGNOSIS — R05 Cough: Secondary | ICD-10-CM

## 2019-02-26 DIAGNOSIS — R3589 Other polyuria: Secondary | ICD-10-CM

## 2019-02-26 DIAGNOSIS — R21 Rash and other nonspecific skin eruption: Secondary | ICD-10-CM

## 2019-02-26 LAB — URINALYSIS, ROUTINE W REFLEX MICROSCOPIC
Bilirubin Urine: NEGATIVE
Hgb urine dipstick: NEGATIVE
Ketones, ur: NEGATIVE
Leukocytes,Ua: NEGATIVE
Nitrite: NEGATIVE
RBC / HPF: NONE SEEN (ref 0–?)
Specific Gravity, Urine: <=1.005 — AB (ref 1.000–1.030)
Total Protein, Urine: NEGATIVE
Urine Glucose: NEGATIVE
Urobilinogen, UA: 0.2 (ref 0.0–1.0)
WBC, UA: NONE SEEN (ref 0–?)
pH: 7 (ref 5.0–8.0)

## 2019-02-26 LAB — COMPREHENSIVE METABOLIC PANEL
ALT: 24 U/L (ref 0–53)
AST: 16 U/L (ref 0–37)
Albumin: 4.6 g/dL (ref 3.5–5.2)
Alkaline Phosphatase: 54 U/L (ref 39–117)
BUN: 11 mg/dL (ref 6–23)
CO2: 26 mEq/L (ref 19–32)
Calcium: 9.9 mg/dL (ref 8.4–10.5)
Chloride: 104 mEq/L (ref 96–112)
Creatinine, Ser: 0.93 mg/dL (ref 0.40–1.50)
GFR: 95.07 mL/min (ref >60.00)
Glucose, Bld: 84 mg/dL (ref 70–99)
Potassium: 4 mEq/L (ref 3.5–5.1)
Sodium: 140 mEq/L (ref 135–145)
Total Bilirubin: 0.6 mg/dL (ref 0.2–1.2)
Total Protein: 7.4 g/dL (ref 6.0–8.3)

## 2019-02-26 LAB — TSH: TSH: 1.16 u[IU]/mL (ref 0.35–4.50)

## 2019-02-26 LAB — CBC
HCT: 45.1 % (ref 39.0–52.0)
Hemoglobin: 15.6 g/dL (ref 13.0–17.0)
MCHC: 34.5 g/dL (ref 30.0–36.0)
MCV: 93.2 fl (ref 78.0–100.0)
Platelets: 316 10*3/uL (ref 150.0–400.0)
RBC: 4.84 Mil/uL (ref 4.22–5.81)
RDW: 13.3 % (ref 11.5–15.5)
WBC: 7.8 10*3/uL (ref 4.0–10.5)

## 2019-02-26 MED ORDER — DICYCLOMINE HCL 10 MG PO CAPS: 10.0000 mg | ORAL_CAPSULE | Freq: Three times a day (TID) | ORAL | 1 refills | Status: DC

## 2019-02-26 MED ORDER — TRIAMCINOLONE ACETONIDE 0.1 % EX CREA: 1.0000 "application " | TOPICAL_CREAM | Freq: Two times a day (BID) | CUTANEOUS | 0 refills | Status: DC

## 2019-02-26 NOTE — Assessment & Plan Note (Addendum)
History consistent with IBS.  No red flags.  Benign exam.  Will start Bentyl and peppermint oil.  Encouraged food diary.  If no improvement will need referral to GI.  Drinks quite a bit of Gatorade Zero as well.  Advised him that artificial sugar may worsen diarrhea or polyuria.

## 2019-02-26 NOTE — Progress Notes (Signed)
   Chief Complaint:  Dustin [name] is a 28 y.o. male who presents today with a chief complaint of diarrhea.   Assessment/Plan:  Diarrhea History consistent with IBS.  No red flags.  Benign exam.  Will start Bentyl and peppermint oil.  Encouraged food diary.  If no improvement will need referral to GI.  Drinks quite a bit of Gatorade Zero as well.  Advised him that artificial sugar may worsen diarrhea or polyuria.  Cough No improvement with Astelin or Protonix.  Will check chest x-ray today.  If continues to have issues, will need referral to pulmonology.  Rash No red flags.  Does not appear to be tinea cruris.  Will start treatment with triamcinolone-Eucerin mixture.  Will consider empiric treatment for tinea cruris if does not improve with this.  Check CBC, C met, TSH.  Polyuria Likely due to polydipsia and ingestion of artificial sweeteners.  Will check UA today.  Preventative health care Flu vaccine today.  Check HIV, RPR, and urine gonorrhea/chlamydia/trichomonas.    Subjective:  HPI:  Diarrhea Has had chronic issues with cramping and abdominal pain for several years. Worse over the last month or so. Some nausea, Pain is sharp in nature and in lower abdomen. Pain is alleviated with bowel movements. Has 3-4 bowel movements per day. No blood in stool. No weight loss or night sweats.  He has also had some posterior mouth irritation for the past few weeks.  No other obvious aggravating or alleviating factors.  Drinks about 40 ounces of Gatorade 0/day.  He has had some irritation into his bilateral lower legs reporting over the past few weeks as well.  Area is very itchy.  No treatments tried.  No changes in soaps, detergents, or any other obvious exposures.  Had something similar in the past.  Was treated with cream which helped.  Patient still has chronic lingering cough.  We started him on Astelin and Protonix however neither the seem to help.  He would also like to be checked  for STDs today.  ROS: Per HPI  PMH: He reports that he has been smoking cigarettes. He has been smoking about 0.50 packs per day. He has never used smokeless tobacco. He reports current alcohol use. He reports that he does not use drugs.      Objective:  Physical Exam: BP 124/77   Pulse 85   Temp 98.6 F (37 C)   Ht '5\' 10"'$  (1.778 m)   Wt 171 lb 3.2 oz (77.7 kg)   SpO2 97%   BMI 24.56 kg/m   Gen: NAD, resting comfortably CV: Regular rate and rhythm with no murmurs appreciated Pulm: Normal work of breathing, clear to auscultation bilaterally with no crackles, wheezes, or rhonchi GI: Normal bowel sounds present. Soft, Nontender, Nondistended. MSK: No edema, cyanosis, or clubbing noted Skin: Warm, dry.  Scrotum with small excoriations noted.  Minimal erythema. Neuro: Grossly normal, moves all extremities Psych: Normal affect and thought content  No results found for this or any previous visit (from the past 24 hour(s)).      Algis Greenhouse. Jerline Pain, MD 02/26/2019 2:04 PM

## 2019-02-26 NOTE — Patient Instructions (Signed)
It was very nice to see you today!  You may have IBS. Please try the bentyl and peppermint oil.  Please use the cream wmixed with eucerin for your irritation.  We will check and xray, blood work, and a urine sample today.   Take care, Dr Jerline Pain  Please try these tips to maintain a healthy lifestyle:   Eat at least 3 REAL meals and 1-2 snacks per day.  Aim for no more than 5 hours between eating.  If you eat breakfast, please do so within one hour of getting up.    Obtain twice as many fruits/vegetables as protein or carbohydrate foods for both lunch and dinner. (Half of each meal should be fruits/vegetables, one quarter protein, and one quarter starchy carbs)   Cut down on sweet beverages. This includes juice, soda, and sweet tea.    Exercise at least 150 minutes every week.

## 2019-02-27 LAB — URINE CYTOLOGY ANCILLARY ONLY
Chlamydia: NEGATIVE
Comment: NEGATIVE
Comment: NEGATIVE
Comment: NORMAL
Neisseria Gonorrhea: NEGATIVE
Trichomonas: NEGATIVE

## 2019-02-27 LAB — HIV ANTIBODY (ROUTINE TESTING W REFLEX): HIV 1&2 Ab, 4th Generation: NONREACTIVE

## 2019-02-27 LAB — RPR: RPR Ser Ql: NONREACTIVE

## 2019-02-27 NOTE — Progress Notes (Signed)
Please inform patient of the following:  Xray is normal - no signs of infection or anything else that could be causing his cough. Would like for him to continue to monitor and let us know if it continues to be bothersome.  Algis Greenhouse. Jerline Pain, MD 02/27/2019 10:43 AM

## 2019-02-28 NOTE — Progress Notes (Signed)
Please inform patient of the following:  His urine was very dilute. I think his excessive urination is due to drinking too much fluids. We do not need to do anything at this point, though if it continues to be bothersome to him, recommend cutting back some on gatorade zeros. All of his other labs are NORMAL.  Dustin [name]. Jerline Pain, MD 02/28/2019 7:55 AM

## 2019-04-05 ENCOUNTER — Ambulatory Visit: Payer: PRIVATE HEALTH INSURANCE | Admitting: Allergy & Immunology

## 2019-04-26 ENCOUNTER — Ambulatory Visit (INDEPENDENT_AMBULATORY_CARE_PROVIDER_SITE_OTHER): Payer: 59 | Admitting: Family Medicine

## 2019-04-26 ENCOUNTER — Encounter: Payer: Self-pay | Admitting: Family Medicine

## 2019-04-26 ENCOUNTER — Other Ambulatory Visit: Payer: Self-pay

## 2019-04-26 VITALS — BP 116/68 | HR 85 | Temp 98.0°F | Ht 70.0 in | Wt 165.0 lb

## 2019-04-26 DIAGNOSIS — L600 Ingrowing nail: Secondary | ICD-10-CM

## 2019-04-26 NOTE — Progress Notes (Signed)
   NACARI POINT is a 29 y.o. male who presents today for an office visit.  Assessment/Plan:  New/Acute Problems: Ingrown Toenail / Onychomycosis  Removed today. See below procedure note. Can use OTC analgesics if needed.     Subjective:  HPI:  Patient here today with ingrown toenail.  Started several months ago.  Located on right second toe.  Nonpainful.  No obvious injuries without precipitating events.       Objective:  Physical Exam: BP 116/68   Pulse 85   Temp 98 F (36.7 C)   Ht 5\' 10"  (1.778 m)   Wt 165 lb (74.8 kg)   SpO2 98%   BMI 23.68 kg/m   Gen: No acute distress, resting comfortably CV: Regular rate and rhythm with no murmurs appreciated Pulm: Normal work of breathing, clear to auscultation bilaterally with no crackles, wheezes, or rhonchi Neuro: Grossly normal, moves all extremities Psych: Normal affect and thought content MSK: Ingrown toenail on right second toe.  Underlying onychomycosis as well.  Toenail Removal Procedure note:  Verbal consent was obtained. A digital block was performed using 2% lidocaine without epinephrine. Using a tourniquet for hemostasis and sterile instruments, the nail was freed from the nail bed.   This was well tolerated with minimal bleeding. Antibiotic ointment and a dressing were applied. OTC analgesics were recommended pain. Care after instructions were discussed and handout was given.        Algis Greenhouse. Jerline Pain, MD 04/26/2019 8:35 AM

## 2019-04-26 NOTE — Patient Instructions (Signed)
Fingernail or Toenail Removal, Adult, Care After °This sheet gives you information about how to care for yourself after your procedure. Your health care provider may also give you more specific instructions. If you have problems or questions, contact your health care provider. °What can I expect after the procedure? °After the procedure, it is common to have: °· Pain. °· Redness. °· Swelling. °· Soreness. °Follow these instructions at home: °Medicines °· Take over-the-counter and prescription medicines only as told by your health care provider. °· If you were prescribed an antibiotic medicine, take or apply it as told by your health care provider. Do not stop using the antibiotic even if you start to feel better. °Wound care °· Follow instructions from your health care provider about how to take care of your wound. Make sure you: °? Wash your hands with soap and water for at least 20 seconds before and after you change your bandage (dressing). If soap and water are not available, use hand sanitizer. °? Change your dressing as told by your health care provider. °? Keep your dressing dry until your health care provider says it can be removed. °· Check your wound every day for signs of infection. Check for: °? More redness, swelling, or pain. °? More fluid or blood. °? Warmth. °? Pus or a bad smell. °If you have a splint: ° °· Wear the splint as told by your health care provider. Remove it only as told by your health care provider. °· Loosen the splint if your fingers tingle, become numb, or turn cold and blue. °· Keep the splint clean. °· If the splint is not waterproof: °? Do not let it get wet. °? Cover it with a watertight covering when you take a bath or a shower. °Managing pain, stiffness, and swelling °· Move your fingers or toes often to reduce stiffness and swelling. °· Raise (elevate) the injured area above the level of your heart while you are sitting or lying down. You may need to keep your hand or foot  raised or supported on a pillow for 24 hours or as told by your health care provider. °General instructions °· If you were given a shoe to wear, wear it as told by your health care provider. °· Keep all follow-up visits as told by your health care provider. This is important. °Contact a health care provider if: °· You have more redness, swelling, or pain around your wound. °· You have more fluid or blood coming from your wound. °· You have pus or a bad smell coming from your wound. °· Your wound feels warm to the touch. °· You have a fever. °Get help right away if: °· Your finger or toe looks pale, blue, or black. °· You are not able to move your finger or toe. °Summary °· After the procedure, it is common to have pain and swelling. °· Keep the hand or foot raised (elevated) or supported on a pillow as told by your health care provider. °· Take over-the-counter and prescription medicines only as told by your health care provider. °· Check your wound every day for signs of infection. °This information is not intended to replace advice given to you by your health care provider. Make sure you discuss any questions you have with your health care provider. °Document Revised: 11/13/2018 Document Reviewed: 11/13/2018 °Elsevier Patient Education © 2020 [employer] Inc[employer] ° °

## 2019-05-30 ENCOUNTER — Ambulatory Visit (INDEPENDENT_AMBULATORY_CARE_PROVIDER_SITE_OTHER): Payer: 59 | Admitting: Physician Assistant

## 2019-05-30 ENCOUNTER — Other Ambulatory Visit: Payer: Self-pay

## 2019-05-30 ENCOUNTER — Encounter: Payer: Self-pay | Admitting: Physician Assistant

## 2019-05-30 VITALS — BP 110/78 | HR 78 | Temp 97.9°F | Ht 70.0 in | Wt 166.5 lb

## 2019-05-30 DIAGNOSIS — R21 Rash and other nonspecific skin eruption: Secondary | ICD-10-CM | POA: Diagnosis not present

## 2019-05-30 MED ORDER — TRIAMCINOLONE ACETONIDE 0.5 % EX OINT: TOPICAL_OINTMENT | CUTANEOUS | 0 refills | Status: DC

## 2019-05-30 MED ORDER — HYDROXYZINE HCL 25 MG PO TABS: ORAL_TABLET | ORAL | 0 refills | Status: DC

## 2019-05-30 NOTE — Patient Instructions (Signed)
It was great to see you!  Try the topical ointment I sent in -- use 1-2 times daily. If it makes it worse, stop immediately and let me know.  Use the oral atarax as needed for itching. May make you sleepy.  Follow-up if no improvement or any worsening.  Take care,  Inda Coke PA-C

## 2019-05-30 NOTE — Progress Notes (Signed)
Dustin [name] is a 29 y.o. male here for a new problem.  I acted as a Education administrator for [employer], PA-C Anselmo Pickler, LPN  History of Present Illness:   Chief Complaint  Patient presents with  . Rash    HPI   Rash Pt c/o rash on bilateral legs from ankles up to mid calves. Red and itchy. Tried lotion and itch cream no relief. Denies: fever, chills, open lesions, new detergents, new pets. Does take xyzal daily but hasn't tried any additional benadryl.  Past Medical History:  Diagnosis Date  . Asthma   . Bilateral headaches 01/27/2015  . Headache   . Hearing loss   . POTS (postural orthostatic tachycardia syndrome) 02/13/2016  . Vertigo      Social History   Socioeconomic History  . Marital status: Married    Spouse name: Dustin [name]  . Number of children: 1  . Years of education: [employer]  . Highest education level: Not on file  Occupational History  . Not on file  Tobacco Use  . Smoking status: Current Every Day Smoker    Packs/day: 0.50    Types: Cigarettes  . Smokeless tobacco: Never Used  Substance and Sexual Activity  . Alcohol use: Yes    Alcohol/week: 0.0 standard drinks    Comment: social  . Drug use: No  . Sexual activity: Yes    Partners: Female  Other Topics Concern  . Not on file  Social History Narrative   Patient lives at home with his wife Dustin [name]).   Patient is in the [employer]    Right handed.   Caffeine sweet tea once daily.   Social Determinants of Health   Financial Resource Strain:   . Difficulty of Paying Living Expenses: Not on file  Food Insecurity:   . Worried About Charity fundraiser in the Last Year: Not on file  . Ran Out of Food in the Last Year: Not on file  Transportation Needs:   . Lack of Transportation (Medical): Not on file  . Lack of Transportation (Non-Medical): Not on file  Physical Activity:   . Days of Exercise per Week: Not on file  . Minutes of Exercise per Session: Not on file  Stress:   . Feeling of Stress  : Not on file  Social Connections:   . Frequency of Communication with Friends and Family: Not on file  . Frequency of Social Gatherings with Friends and Family: Not on file  . Attends Religious Services: Not on file  . Active Member of Clubs or Organizations: Not on file  . Attends Archivist Meetings: Not on file  . Marital Status: Not on file  Intimate Partner Violence:   . Fear of Current or Ex-Partner: Not on file  . Emotionally Abused: Not on file  . Physically Abused: Not on file  . Sexually Abused: Not on file    Past Surgical History:  Procedure Laterality Date  . INGUINAL HERNIA REPAIR    . SINUS ENDO W/FUSION      Family History  Problem Relation Age of Onset  . Cancer Father        throat   . Migraines Mother   . Cancer Paternal Grandfather     Allergies  Allergen Reactions  . Biaxin [Clarithromycin] Hives  . Ceftin [Cefuroxime Axetil] Hives  . Penicillins Hives    Has patient had a PCN reaction causing immediate rash, facial/tongue/throat swelling, SOB or lightheadedness with hypotension: Yes Has  patient had a PCN reaction causing severe rash involving mucus membranes or skin necrosis: Yes Has patient had a PCN reaction that required hospitalization: No Has patient had a PCN reaction occurring within the last 10 years: No If all of the above answers are "NO", then may proceed with Cephalosporin use.   . Tetracyclines & Related Hives    Current Medications:   Current Outpatient Medications:  .  azelastine (ASTELIN) 0.1 % nasal spray, Place 2 sprays into both nostrils 2 (two) times daily., Disp: 30 mL, Rfl: 12 .  dicyclomine (BENTYL) 10 MG capsule, Take 1 capsule (10 mg total) by mouth 4 (four) times daily -  before meals and at bedtime., Disp: 120 capsule, Rfl: 1 .  DULoxetine (CYMBALTA) 60 MG capsule, Take 1 capsule (60 mg total) by mouth daily., Disp: 90 capsule, Rfl: 3 .  levocetirizine (XYZAL) 5 MG tablet, Take 1 tablet (5 mg total) by mouth  every evening., Disp: 30 tablet, Rfl: 5 .  loratadine (CLARITIN) 10 MG tablet, TAKE 1 TABLET(10 MG) BY MOUTH DAILY, Disp: 30 tablet, Rfl: 0 .  montelukast (SINGULAIR) 10 MG tablet, Take 1 tablet (10 mg total) by mouth at bedtime., Disp: 30 tablet, Rfl: 5 .  pantoprazole (PROTONIX) 40 MG tablet, Take 1 tablet (40 mg total) by mouth daily., Disp: 30 tablet, Rfl: 5 .  triamcinolone (NASACORT) 55 MCG/ACT AERO nasal inhaler, Place 2 sprays into the nose daily., Disp: 16.5 g, Rfl: 5 .  hydrOXYzine (ATARAX/VISTARIL) 25 MG tablet, Take 1 tablet as needed every 6 hours for itching, Disp: 30 tablet, Rfl: 0 .  triamcinolone ointment (KENALOG) 0.5 %, Apply to affected area 1-2 times daily, Disp: 15 g, Rfl: 0   Review of Systems:   ROS  Negative unless otherwise specified per HPI.   Vitals:   Vitals:   05/30/19 1504  BP: 110/78  Pulse: 78  Temp: 97.9 F (36.6 C)  TempSrc: Temporal  SpO2: 98%  Weight: 166 lb 8 oz (75.5 kg)  Height: 5\' 10"  (1.778 m)     Body mass index is 23.89 kg/m.  Physical Exam:   Physical Exam Vitals and nursing note reviewed.  Constitutional:      Appearance: He is well-developed.  HENT:     Head: Normocephalic.  Eyes:     Conjunctiva/sclera: Conjunctivae normal.     Pupils: Pupils are equal, round, and reactive to light.  Pulmonary:     Effort: Pulmonary effort is normal.  Musculoskeletal:        General: Normal range of motion.     Cervical back: Normal range of motion.  Skin:    General: Skin is warm and dry.     Comments: Diffuse bilateral lower extremity sandpaper like erythematous rash mostly on distal lower extremities. No warmth,  Few 1-2 mm scattered erythematous papules to lower extremities. No tenderness or active discharge.  Neurological:     Mental Status: He is alert and oriented to person, place, and time.  Psychiatric:        Behavior: Behavior normal.        Thought Content: Thought content normal.        Judgment: Judgment normal.       Assessment and Plan:   Odalis was seen today for rash.  Diagnoses and all orders for this visit:  Rash Unclear etiology. Suspect some level of dermatitis. Will trial kenalog ointment and prn atarax. Recommended close follow-up if no improvement of symptoms. No evidence of infection or red  flags on my exam today.  Other orders -     hydrOXYzine (ATARAX/VISTARIL) 25 MG tablet; Take 1 tablet as needed every 6 hours for itching -     triamcinolone ointment (KENALOG) 0.5 %; Apply to affected area 1-2 times daily    . Reviewed expectations re: course of current medical issues. . Discussed self-management of symptoms. . Outlined signs and symptoms indicating need for more acute intervention. . Patient verbalized understanding and all questions were answered. . See orders for this visit as documented in the electronic medical record. . Patient received an After-Visit Summary.  CMA or LPN served as scribe during this visit. History, Physical, and Plan performed by medical provider. The above documentation has been reviewed and is accurate and complete.   Inda Coke, PA-C

## 2019-10-15 ENCOUNTER — Telehealth: Payer: Self-pay | Admitting: Family Medicine

## 2019-10-15 NOTE — Telephone Encounter (Signed)
NOted

## 2019-10-15 NOTE — Telephone Encounter (Signed)
Patient is scheduled for tomorrow with Aldona Bar   Nurse Assessment Nurse: Rufina Falco, RN, Deb Date/Time Eilene Ghazi Time): 10/15/2019 12:13:09 PM Confirm and document reason for call. If symptomatic, describe symptoms. ---Caller states that he has a burning sensation on his chest and feels swelling on left side of neck that aches. Denies heaviness or tightness on his chest. Has the patient had close contact with a person known or suspected to have the novel coronavirus illness OR traveled / lives in area with major community spread (including international travel) in the last 14 days from the onset of symptoms? * If Asymptomatic, screen for exposure and travel within the last 14 days. ---No Does the patient have any new or worsening symptoms? ---Yes Will a triage be completed? ---Yes Related visit to physician within the last 2 weeks? ---No Does the PT have any chronic conditions? (i.e. diabetes, asthma, this includes High risk factors for pregnancy, etc.) ---No Is this a behavioral health or substance abuse call? ---No Guidelines Guideline Title Affirmed Question Affirmed Notes Nurse Date/Time Eilene Ghazi Time) Abdominal Pain - Upper [1] MILD pain (e.g., does not interfere with normal activities) AND [2] comes and goes (cramps) AND [3] present > 72 [place], RN, Deb 10/15/2019 12:15:30 PMPLEASE NOTE: All timestamps contained within this report are represented as [place] Standard Time. CONFIDENTIALTY NOTICE: This fax transmission is intended only for the addressee. It contains information that is legally privileged, confidential or otherwise protected from use or disclosure. If you are not the intended recipient, you are strictly prohibited from reviewing, disclosing, copying using or disseminating any of this information or taking any action in reliance on or regarding this information. If you have received this fax in error, please notify us immediately by telephone so that we can arrange for its  return to [place]. Phone: [phone], Toll-Free: [phone], Fax: [phone] Page: 2 of 2 Call Id: [id number] Guidelines Guideline Title Affirmed Question Affirmed Notes Nurse Date/Time Eilene Ghazi Time) hours (Exception: this same abdominal pain is a chronic symptom recurrent or ongoing AND present > 4 weeks) Disp. Time Eilene Ghazi Time) Disposition Final User 10/15/2019 12:10:20 PM Send to Urgent Queue Wynema Birch 10/15/2019 12:19:00 PM See PCP within 24 Hours Yes Rufina Falco, RN, Deb Caller Disagree/Comply Comply Caller Understands Yes PreDisposition InappropriateToAsk Care Advice Given Per Guideline SEE PCP WITHIN 24 HOURS: * IF OFFICE WILL BE OPEN: You need to be examined within the next 24 hours. Call your doctor (or NP/PA) when the office opens and make an appointment. CARE ADVICE given per Abdominal Pain, Upper (Adult) guideline. CALL BACK IF: * You become worse. * Drink clear fluids only (e.g., water, flat soft drinks or half-strength Gatorade). * Sip small amounts at a time, until you feel better and the pain is gone. * Then slowly return to a regular diet. DRINK CLEAR FLUIDS: Referrals Warm transfer to [facility]

## 2019-10-15 NOTE — Telephone Encounter (Signed)
FYI

## 2019-10-16 ENCOUNTER — Other Ambulatory Visit: Payer: Self-pay

## 2019-10-16 ENCOUNTER — Encounter: Payer: Self-pay | Admitting: Family Medicine

## 2019-10-16 ENCOUNTER — Ambulatory Visit: Payer: 59 | Admitting: Family Medicine

## 2019-10-16 VITALS — BP 116/68 | HR 87 | Temp 98.0°F | Ht 70.0 in | Wt 179.2 lb

## 2019-10-16 DIAGNOSIS — K219 Gastro-esophageal reflux disease without esophagitis: Secondary | ICD-10-CM

## 2019-10-16 DIAGNOSIS — R079 Chest pain, unspecified: Secondary | ICD-10-CM

## 2019-10-16 DIAGNOSIS — J309 Allergic rhinitis, unspecified: Secondary | ICD-10-CM

## 2019-10-16 DIAGNOSIS — R591 Generalized enlarged lymph nodes: Secondary | ICD-10-CM | POA: Diagnosis not present

## 2019-10-16 DIAGNOSIS — R519 Headache, unspecified: Secondary | ICD-10-CM

## 2019-10-16 NOTE — Progress Notes (Signed)
   Dustin Dustin [name] is a 29 y.o. male who presents today for an office visit.  Assessment/Plan:  New/Acute Problems: Chest Dustin [name] / GERD Likely reflux given his constellation of symptoms.  EKG today is normal.  Normal physical exam.  Well score 0.  History atypical for cardiac etiology.  Will treat with Protonix 40 mg daily.  Lymphadenopathy Has been present for less than a week.  Possibly related to reflux.  Though may have some element of allergic rhinitis/postnasal drip as well.  He will let me know if it is still there after another 3 to 4 weeks and will check ultrasound at that time.  Chronic Problems Addressed Today: Allergic rhinitis Could be contributing to his worsening reflux.  We will continue Claritin for now.  If no improvement with course of Protonix would consider course of prednisone and/or nasal spray.  Bilateral headaches Worsened.  No red flags today.  Likely worsened due to above symptoms.  He will check in with me if not improving the next couple of weeks as we treat his reflux.     Subjective:  HPI:  Patient symptoms started about a week ago.  Initially noticed Dustin [name] and left side of his neck.  Over last 2 days has noticed more heartburn sensation and reflux sensation with irritation in the back of his throat.  Took Tums with no improvement.  He has had no difficulty breathing his symptoms seem to be worse when laying down.  He has had a burning type chest Dustin [name].  Symptoms are nonexertional.  No other specific treatments tried.  Symptoms are overall stable.  Does not have any current symptoms.        Objective:  Physical Exam: BP 116/68   Pulse 87   Temp 98 F (36.7 C)   Ht 5\' 10"  (1.778 m)   Wt 179 lb 3.2 oz (81.3 kg)   SpO2 96%   BMI 25.71 kg/m   Gen: No acute distress, resting comfortably HEENT: OP erythematous.  No exudate.  Mild tonsillar hypertrophy.  Small left anterior cervical lymphadenopathy noted. CV: Regular rate and rhythm with no murmurs  appreciated Pulm: Normal work of breathing, clear to auscultation bilaterally with no crackles, wheezes, or rhonchi Neuro: Grossly normal, moves all extremities Psych: Normal affect and thought content  EKG: Normal sinus rhythm.  No ischemic changes.      Dustin Dustin [name]. Dustin Pain, MD 10/16/2019 12:19 PM

## 2019-10-16 NOTE — Assessment & Plan Note (Signed)
Worsened.  No red flags today.  Likely worsened due to above symptoms.  He will check in with me if not improving the next couple of weeks as we treat his reflux.

## 2019-10-16 NOTE — Patient Instructions (Signed)
It was very nice to see you today!  Please start the protonix.  Let me know if not improving in 1-2 weeks. Please send me a mychart message.  Take care, Dr Jerline Pain  Please try these tips to maintain a healthy lifestyle:   Eat at least 3 REAL meals and 1-2 snacks per day.  Aim for no more than 5 hours between eating.  If you eat breakfast, please do so within one hour of getting up.    Each meal should contain half fruits/vegetables, one quarter protein, and one quarter carbs (no bigger than a computer mouse)   Cut down on sweet beverages. This includes juice, soda, and sweet tea.     Drink at least 1 glass of water with each meal and aim for at least 8 glasses per day   Exercise at least 150 minutes every week.

## 2019-10-16 NOTE — Assessment & Plan Note (Signed)
Could be contributing to his worsening reflux.  We will continue Claritin for now.  If no improvement with course of Protonix would consider course of prednisone and/or nasal spray.

## 2019-11-13 ENCOUNTER — Telehealth: Payer: Self-pay

## 2019-11-13 ENCOUNTER — Encounter: Payer: Self-pay | Admitting: Family Medicine

## 2019-11-13 ENCOUNTER — Other Ambulatory Visit: Payer: Self-pay

## 2019-11-13 DIAGNOSIS — R591 Generalized enlarged lymph nodes: Secondary | ICD-10-CM

## 2019-11-13 NOTE — Telephone Encounter (Signed)
Pt called stating that he is still suffering from heartburn that has increased in severity, to the point he can't sleep sometimes. He is asking what else we can do.

## 2019-11-14 NOTE — Telephone Encounter (Signed)
Please advise 

## 2019-11-15 ENCOUNTER — Other Ambulatory Visit: Payer: Self-pay

## 2019-11-15 ENCOUNTER — Encounter: Payer: Self-pay | Admitting: Gastroenterology

## 2019-11-15 DIAGNOSIS — K219 Gastro-esophageal reflux disease without esophagitis: Secondary | ICD-10-CM

## 2019-11-15 NOTE — Telephone Encounter (Signed)
Patient notified, referral placed.

## 2019-11-15 NOTE — Telephone Encounter (Signed)
Recommend GI referral.  Algis Greenhouse. Jerline Pain, MD 11/15/2019 12:56 PM

## 2019-11-19 ENCOUNTER — Ambulatory Visit
Admission: RE | Admit: 2019-11-19 | Discharge: 2019-11-19 | Disposition: A | Discharge status: Home / Self Care | Payer: 59 | Source: Ambulatory Visit | Attending: Family Medicine | Admitting: Family Medicine

## 2019-11-19 DIAGNOSIS — R591 Generalized enlarged lymph nodes: Secondary | ICD-10-CM

## 2019-11-21 NOTE — Progress Notes (Signed)
Please inform patient of the following:  Ultrasound showed reactive benign lymph node. We should continue to monitor for another month or so and would like for him to let us know if it does not improve, enlaregs, or becomes painful.

## 2019-12-27 ENCOUNTER — Encounter: Payer: Self-pay | Admitting: Gastroenterology

## 2019-12-27 ENCOUNTER — Other Ambulatory Visit (INDEPENDENT_AMBULATORY_CARE_PROVIDER_SITE_OTHER): Payer: 59

## 2019-12-27 ENCOUNTER — Ambulatory Visit (INDEPENDENT_AMBULATORY_CARE_PROVIDER_SITE_OTHER): Payer: 59 | Admitting: Gastroenterology

## 2019-12-27 VITALS — BP 112/66 | HR 75 | Ht 71.0 in | Wt 175.0 lb

## 2019-12-27 DIAGNOSIS — R12 Heartburn: Secondary | ICD-10-CM | POA: Insufficient documentation

## 2019-12-27 DIAGNOSIS — K219 Gastro-esophageal reflux disease without esophagitis: Secondary | ICD-10-CM | POA: Diagnosis not present

## 2019-12-27 DIAGNOSIS — R197 Diarrhea, unspecified: Secondary | ICD-10-CM | POA: Diagnosis not present

## 2019-12-27 DIAGNOSIS — R1084 Generalized abdominal pain: Secondary | ICD-10-CM

## 2019-12-27 DIAGNOSIS — R1310 Dysphagia, unspecified: Secondary | ICD-10-CM | POA: Diagnosis not present

## 2019-12-27 LAB — COMPREHENSIVE METABOLIC PANEL
ALT: 23 U/L (ref 0–53)
AST: 13 U/L (ref 0–37)
Albumin: 5 g/dL (ref 3.5–5.2)
Alkaline Phosphatase: 56 U/L (ref 39–117)
BUN: 12 mg/dL (ref 6–23)
CO2: 29 mEq/L (ref 19–32)
Calcium: 9.9 mg/dL (ref 8.4–10.5)
Chloride: 102 mEq/L (ref 96–112)
Creatinine, Ser: 1.01 mg/dL (ref 0.40–1.50)
GFR: 85.96 mL/min (ref >60.00)
Glucose, Bld: 85 mg/dL (ref 70–99)
Potassium: 3.8 mEq/L (ref 3.5–5.1)
Sodium: 139 mEq/L (ref 135–145)
Total Bilirubin: 0.5 mg/dL (ref 0.2–1.2)
Total Protein: 7.9 g/dL (ref 6.0–8.3)

## 2019-12-27 LAB — CBC WITH DIFFERENTIAL/PLATELET
Basophils Absolute: 0.1 10*3/uL (ref 0.0–0.1)
Basophils Relative: 0.6 % (ref 0.0–3.0)
Eosinophils Absolute: 0.4 10*3/uL (ref 0.0–0.7)
Eosinophils Relative: 3.8 % (ref 0.0–5.0)
HCT: 45.7 % (ref 39.0–52.0)
Hemoglobin: 15.8 g/dL (ref 13.0–17.0)
Lymphocytes Relative: 34.7 % (ref 12.0–46.0)
Lymphs Abs: 3.5 10*3/uL (ref 0.7–4.0)
MCHC: 34.5 g/dL (ref 30.0–36.0)
MCV: 92.4 fl (ref 78.0–100.0)
Monocytes Absolute: 0.5 10*3/uL (ref 0.1–1.0)
Monocytes Relative: 5 % (ref 3.0–12.0)
Neutro Abs: 5.7 10*3/uL (ref 1.4–7.7)
Neutrophils Relative %: 55.9 % (ref 43.0–77.0)
Platelets: 299 10*3/uL (ref 150.0–400.0)
RBC: 4.95 Mil/uL (ref 4.22–5.81)
RDW: 12.7 % (ref 11.5–15.5)
WBC: 10.2 10*3/uL (ref 4.0–10.5)

## 2019-12-27 LAB — C-REACTIVE PROTEIN: CRP: <1 mg/dL (ref 0.5–20.0)

## 2019-12-27 LAB — IGA: IgA: 167 mg/dL (ref 68–378)

## 2019-12-27 LAB — SEDIMENTATION RATE: Sed Rate: 3 mm/hr (ref 0–15)

## 2019-12-27 MED ORDER — OMEPRAZOLE 40 MG PO CPDR: 40.0000 mg | DELAYED_RELEASE_CAPSULE | Freq: Every day | ORAL | 3 refills | Status: DC

## 2019-12-27 MED ORDER — PLENVU 140 G PO SOLR: 1.0000 | ORAL | 0 refills | Status: DC

## 2019-12-27 NOTE — Progress Notes (Signed)
Reviewed and agree with management plan.  Chima Astorino T. Steffani Dionisio, MD FACG [facility]  

## 2019-12-27 NOTE — Patient Instructions (Addendum)
If you are age 31 or older, your body mass index should be between 23-30. Your Body mass index is 24.41 kg/m. If this is out of the aforementioned range listed, please consider follow up with your Primary Care Provider.  If you are age 82 or younger, your body mass index should be between 19-25. Your Body mass index is 24.41 kg/m. If this is out of the aformentioned range listed, please consider follow up with your Primary Care Provider.   Your provider has requested that you go to the basement level for lab work before leaving today. Press "B" on the elevator. The lab is located at the first door on the left as you exit the elevator.  You have been scheduled for an endoscopy and colonoscopy. Please follow the written instructions given to you at your visit today. Please pick up your prep supplies at the pharmacy within the next 1-3 days. If you use inhalers (even only as needed), please bring them with you on the day of your procedure.  START Omeprazole 40 mg 1 capsule daily.  Follow up pending the results of your labs, Colonoscopy/Endoscopy.

## 2019-12-27 NOTE — Progress Notes (Signed)
12/27/2019 Dustin [name] [id number] 12/06/1990   HISTORY OF PRESENT ILLNESS: This is a 29 year old male who is new to our practice.  He has been referred here by his PCP, Dr. Jerline Pain, for evaluation of GERD.  He tells me that he has constant burning in his esophagus for the past several months.  Says it is worse at night.  He also describes sensation that food seems slow to go down and sometimes gets hung up very transiently until he drinks something to help it go down.  He says that he tried pantoprazole for a couple of weeks, but did not seem to help.  Now he is taking Zantac but it does not seem to help much either.  Of note, he noted an enlarged lymph node on his neck.  He had an ultrasound performed that showed a reactive appearing left cervical lymph node at the area of palpable concern.  They are recommending possible CT scan if the finding fail to subside, enlarges, or becomes painful.  He denies painful swallowing.  His father passed away from esophageal cancer in his 21's.  While he is here he also reports he's had abdominal pain and diarrhea every day for years.  He says that he was always dealt with it, but it has definitely worsened over the past several months and even worse over the past couple weeks.  He says that he never really has a formed bowel movement.  He has diarrhea every time that he eats.  He has already had 3 or 4 bowel movements today.  Has urgency.  He reports just constant discomfort in his abdomen.  He denies seeing any blood in his stool.  He tells me though that if he takes even 1 Imodium tablet that it will bind him up to the point he can not have a bowel movement.  Of note, at the end of my visit he indicated to me that he has had a colonoscopy about 4 or 5 years ago.  He had not indicated that on his new patient paperwork.   Past Medical History:  Diagnosis Date  . Alcoholism in recovery ([place])   . Asthma   . Bilateral headaches 01/27/2015  . Headache   .  Hearing loss   . IBS (irritable bowel syndrome)   . POTS (postural orthostatic tachycardia syndrome) 02/13/2016  . Vertigo    Past Surgical History:  Procedure Laterality Date  . INGUINAL HERNIA REPAIR    . SINUS ENDO W/FUSION    . WISDOM TOOTH EXTRACTION      reports that he has been smoking cigarettes. He has been smoking about 0.50 packs per day. He has never used smokeless tobacco. He reports previous alcohol use. He reports that he does not use drugs. family history includes Esophageal cancer (age of onset: 34) in his father; Lung cancer in his paternal grandfather; Migraines in his mother. Allergies  Allergen Reactions  . Biaxin [Clarithromycin] Hives  . Ceftin [Cefuroxime Axetil] Hives  . Penicillins Hives    Has patient had a PCN reaction causing immediate rash, facial/tongue/throat swelling, SOB or lightheadedness with hypotension: Yes Has patient had a PCN reaction causing severe rash involving mucus membranes or skin necrosis: Yes Has patient had a PCN reaction that required hospitalization: No Has patient had a PCN reaction occurring within the last 10 years: No If all of the above answers are "NO", then may proceed with Cephalosporin use.   . Tetracyclines & Related Hives  Outpatient Encounter Medications as of 12/27/2019  Medication Sig  . DULoxetine (CYMBALTA) 60 MG capsule Take 1 capsule (60 mg total) by mouth daily.  . ranitidine (ZANTAC) 75 MG tablet Take 150 mg by mouth at bedtime.  Marland Kitchen loratadine (CLARITIN) 10 MG tablet TAKE 1 TABLET(10 MG) BY MOUTH DAILY (Patient taking differently: Take 10 mg by mouth daily as needed for allergies. )  . [DISCONTINUED] triamcinolone ointment (KENALOG) 0.5 % Apply to affected area 1-2 times daily   No facility-administered encounter medications on file as of 12/27/2019.     REVIEW OF SYSTEMS  : All other systems reviewed and negative except where noted in the History of Present Illness.   PHYSICAL EXAM: BP 112/66    Pulse 75   Ht 5\' 11"  (1.803 m)   Wt 175 lb (79.4 kg)   SpO2 99%   BMI 24.41 kg/m  General: Well developed white male in no acute distress Head: Normocephalic and atraumatic Eyes:  Sclerae anicteric, conjunctiva pink. Ears: Normal auditory acuity Lungs: Clear throughout to auscultation; no W/R/R. Heart: Regular rate and rhythm; no M/R/G. Abdomen: Soft, non-distended. BS present.  Non-tender. Rectal:  Will be done at the time of colonoscopy. Musculoskeletal: Symmetrical with no gross deformities  Skin: No lesions on visible extremities Extremities: No edema  Neurological: Alert oriented x 4, grossly non-focal Psychological:  Alert and cooperative. Normal mood and affect  ASSESSMENT AND PLAN: *29 year old male complaining of severe esophageal burning/reflux.  Also reports that food is slow to go down and some mild dysphagia.  Of note, he was also found to have an enlarged cervical lymph node that is thought to be reactive, but they are also recommending possible CT of the neck.  Question if this is related to his GI complaints.  We will start omeprazole 40 mg daily.  Prescription sent to the pharmacy.  We will plan for EGD with Dr. Fuller Plan.  His father passed away from esophageal cancer. *He also complains of longstanding diarrhea and generalized abdominal pain.  He says that he always has diarrhea and abdominal pain for years, but has worsened recently over the past several months and even more so over the past couple of weeks.  Will check CBC, CMP, TSH, sed rate, CRP, celiac labs.  Will check stool studies for infectious causes in light of acute worsening.  If the stool studies are negative we will also proceed with colonoscopy with Dr. Fuller Plan to rule out IBD, etc.  Of note, at the very end of my visit as I was discussing colonoscopy bowel prep with the patient he indicated to me that he has had a colonoscopy in the past, probably 4-5 years ago.  We will try to obtain those records as well.  After  finding out that piece of information I feel that his abdominal pain and diarrhea is likely all anxiety induced/severe IBS.  **The risks, benefits, and alternatives to EGD and colonoscopy were discussed with the patient and he consents to proceed.  CC:  Vivi Barrack, MD

## 2019-12-28 LAB — TSH: TSH: 1.02 u[IU]/mL (ref 0.35–4.50)

## 2019-12-31 LAB — TISSUE TRANSGLUTAMINASE, IGA: (tTG) Ab, IgA: <1 U/mL

## 2020-01-30 ENCOUNTER — Encounter: Payer: 59 | Admitting: Gastroenterology

## 2020-02-05 ENCOUNTER — Telehealth: Payer: Self-pay | Admitting: Gastroenterology

## 2020-02-05 NOTE — Telephone Encounter (Signed)
Returned patients call. Patient states that he forgot to go to his pre-procedure Covid test. Patient was informed that if he could go today before 4:00 Pm we could proceed with the scheduled procedure. Patient agreed. [facility] was called and informed that the patient was coming in today before 4:00 Pm.   Riki Sheer, LPN ( Admitting )

## 2020-02-05 NOTE — Telephone Encounter (Signed)
Pt is requesting a call back from a nurse to discuss his missed covid test appt, pt is scheduled for a procedure for tomorrow, pt would like to know what he need to do.

## 2020-02-06 ENCOUNTER — Encounter: Payer: Self-pay | Admitting: Gastroenterology

## 2020-02-06 ENCOUNTER — Other Ambulatory Visit: Payer: Self-pay

## 2020-02-06 ENCOUNTER — Ambulatory Visit (AMBULATORY_SURGERY_CENTER): Payer: 59 | Admitting: Gastroenterology

## 2020-02-06 VITALS — BP 103/55 | HR 89 | Temp 97.3°F | Resp 26 | Ht 71.0 in | Wt 175.0 lb

## 2020-02-06 DIAGNOSIS — R1310 Dysphagia, unspecified: Secondary | ICD-10-CM | POA: Diagnosis not present

## 2020-02-06 DIAGNOSIS — K2100 Gastro-esophageal reflux disease with esophagitis, without bleeding: Secondary | ICD-10-CM

## 2020-02-06 DIAGNOSIS — K3189 Other diseases of stomach and duodenum: Secondary | ICD-10-CM

## 2020-02-06 DIAGNOSIS — K2960 Other gastritis without bleeding: Secondary | ICD-10-CM | POA: Diagnosis not present

## 2020-02-06 DIAGNOSIS — K219 Gastro-esophageal reflux disease without esophagitis: Secondary | ICD-10-CM

## 2020-02-06 HISTORY — PX: COLONOSCOPY: SHX174

## 2020-02-06 MED ORDER — SODIUM CHLORIDE 0.9 % IV SOLN: 500.0000 mL | Freq: Once | INTRAVENOUS | Status: DC

## 2020-02-06 NOTE — Patient Instructions (Signed)
Information on esophagitis and gastritis given to you today.  Await pathology results.   Avoid NSAIDS (Aspirin, Ibuprofen, Aleve, Naproxen), you may use Tylenol as needed.  Change pantoprazole to 40 mg by mouth 2 times a day.  Famotidine 40 mg by mouth at bedtime. This is over the counter Pepcid.  Return to GI office in 1 month.  YOU HAD AN ENDOSCOPIC PROCEDURE TODAY AT [facility]:   Refer to the procedure report that was given to you for any specific questions about what was found during the examination.  If the procedure report does not answer your questions, please call your gastroenterologist to clarify.  If you requested that your care partner not be given the details of your procedure findings, then the procedure report has been included in a sealed envelope for you to review at your convenience later.  YOU SHOULD EXPECT: Some feelings of bloating in the abdomen. Passage of more gas than usual.  Walking can help get rid of the air that was put into your GI tract during the procedure and reduce the bloating. If you had a lower endoscopy (such as a colonoscopy or flexible sigmoidoscopy) you may notice spotting of blood in your stool or on the toilet paper. If you underwent a bowel prep for your procedure, you may not have a normal bowel movement for a few days.  Please Note:  You might notice some irritation and congestion in your nose or some drainage.  This is from the oxygen used during your procedure.  There is no need for concern and it should clear up in a day or so.  SYMPTOMS TO REPORT IMMEDIATELY:    Following upper endoscopy (EGD)  Vomiting of blood or coffee ground material  New chest pain or pain under the shoulder blades  Painful or persistently difficult swallowing  New shortness of breath  Fever of 100F or higher  Black, tarry-looking stools  For urgent or emergent issues, a gastroenterologist can be reached at any hour by calling [phone]  [phone]. Do not use MyChart messaging for urgent concerns.    DIET:  We do recommend a small meal at first, but then you may proceed to your regular diet.  Drink plenty of fluids but you should avoid alcoholic beverages for 24 hours.  ACTIVITY:  You should plan to take it easy for the rest of today and you should NOT DRIVE or use heavy machinery until tomorrow (because of the sedation medicines used during the test).    FOLLOW UP: Our staff will call the number listed on your records 48-72 hours following your procedure to check on you and address any questions or concerns that you may have regarding the information given to you following your procedure. If we do not reach you, we will leave a message.  We will attempt to reach you two times.  During this call, we will ask if you have developed any symptoms of COVID 19. If you develop any symptoms (ie: fever, flu-like symptoms, shortness of breath, cough etc.) before then, please call [phone].  If you test positive for Covid 19 in the 2 weeks post procedure, please call and report this information to [place].    If any biopsies were taken you will be contacted by phone or by letter within the next 1-3 weeks.  Please call us at [phone] if you have not heard about the biopsies in 3 weeks.    SIGNATURES/CONFIDENTIALITY: You and/or your care partner have signed  paperwork which will be entered into your electronic medical record.  These signatures attest to the fact that that the information above on your After Visit Summary has been reviewed and is understood.  Full responsibility of the confidentiality of this discharge information lies with you and/or your care-partner.

## 2020-02-06 NOTE — Op Note (Signed)
[facility] Patient Name: Dustin [name] Procedure Date: 02/06/2020 9:36 AM MRN: [id number] Endoscopist: Ladene Artist , MD Age: 29 Referring MD:  Date of Birth: 08-26-1990 Gender: Male Account #: [id number] Procedure:                Upper GI endoscopy Indications:              Dysphagia, Gastroesophageal reflux disease Medicines:                Monitored Anesthesia Care Procedure:                Pre-Anesthesia Assessment:                           - Prior to the procedure, a History and Physical                            was performed, and patient medications and                            allergies were reviewed. The patient's tolerance of                            previous anesthesia was also reviewed. The risks                            and benefits of the procedure and the sedation                            options and risks were discussed with the patient.                            All questions were answered, and informed consent                            was obtained. Prior Anticoagulants: The patient has                            taken no previous anticoagulant or antiplatelet                            agents. ASA Grade Assessment: II - A patient with                            mild systemic disease. After reviewing the risks                            and benefits, the patient was deemed in                            satisfactory condition to undergo the procedure.                           After obtaining informed consent, the endoscope was  passed under direct vision. Throughout the                            procedure, the patient's blood pressure, pulse, and                            oxygen saturations were monitored continuously. The                            Endoscope was introduced through the mouth, and                            advanced to the second part of duodenum. The upper                            GI endoscopy  was accomplished without difficulty.                            The patient tolerated the procedure fairly well. Scope In: Scope Out: Findings:                 LA Grade B (one or more mucosal breaks greater than                            5 mm, not extending between the tops of two mucosal                            folds) esophagitis with no bleeding was found in                            the distal esophagus. Biopsies were taken with a                            cold forceps for histology in the mid and distal                            esophagus.                           The exam of the esophagus was otherwise normal.                           Multiple localized medium erosions with no bleeding                            and no stigmata of recent bleeding were found in                            the gastric antrum and in the prepyloric region of                            the stomach. Biopsies were taken with a cold  forceps for histology.                           The exam of the stomach was otherwise normal.                           A few localized erosions without bleeding were                            found in the duodenal bulb.                           The exam of the duodenum was otherwise normal. Complications:            No immediate complications. Estimated Blood Loss:     Estimated blood loss was minimal. Impression:               - LA Grade B reflux esophagitis with no bleeding.                            Biopsied.                           - Erosive gastropathy with no bleeding and no                            stigmata of recent bleeding. Biopsied.                           - Duodenal erosions without bleeding. Recommendation:           - Patient has a contact number available for                            emergencies. The signs and symptoms of potential                            delayed complications were discussed with the                             patient. Return to normal activities tomorrow.                            Written discharge instructions were provided to the                            patient.                           - Resume previous diet.                           - Closely follow all antireflux measures.                           - Continue present medications.                           -  Change pantoprazole to 40 mg po bid ac, 1 year of                            refills.                           - Famotidine 40 mg po hs, 1 year of refills.                           - No aspirin, ibuprofen, naproxen, or other                            non-steroidal anti-inflammatory drugs.                           - Await pathology results.                           - Return to GI office in 1 month with me or Alonza Bogus, PA-C. Ladene Artist, MD 02/06/2020 10:01:23 AM This report has been signed electronically.

## 2020-02-06 NOTE — Progress Notes (Signed)
Called to room to assist during endoscopic procedure.  Patient ID and intended procedure confirmed with present staff. Received instructions for my participation in the procedure from the performing physician.  

## 2020-02-06 NOTE — Progress Notes (Signed)
pt tolerated well. VSS. awake and to recovery. Report given to RN. Bite block inserted without trauma. Left in place to recovery.

## 2020-02-07 ENCOUNTER — Telehealth: Payer: Self-pay | Admitting: *Deleted

## 2020-02-07 ENCOUNTER — Telehealth: Payer: Self-pay | Admitting: Gastroenterology

## 2020-02-07 MED ORDER — SUCRALFATE 1 GM/10ML PO SUSP: 1.0000 g | Freq: Four times a day (QID) | ORAL | 0 refills | Status: DC

## 2020-02-07 MED ORDER — SUCRALFATE 1 G PO TABS: 1.0000 g | ORAL_TABLET | Freq: Four times a day (QID) | ORAL | 0 refills | Status: DC

## 2020-02-07 NOTE — Telephone Encounter (Signed)
Spoke to pharmacist at [facility], carafate suspension is not covered by insurance but the tablets are covered. Pharmacist would like to switch patient to tablets. Informed pharmacy that patient can be switched to tablets and tell patient to crush tablets to make a slurry. Pharmacist verbalized understanding.

## 2020-02-07 NOTE — Telephone Encounter (Signed)
Dr. Fuller Plan recommended Carafate suspension. Ordered and sent to his pharmacy. Reviewed post EGD instructions with patient again. Pt verbalized understanding.

## 2020-02-07 NOTE — Telephone Encounter (Signed)
Please emphasize and review all the recommendations made on his EGD report. In addition please start Carafate suspension 1 g po qid for 5 days then qid prn heartburn, dyspepsia.

## 2020-02-07 NOTE — Telephone Encounter (Signed)
Pt called c/o pain 7/10 mid chest post EGD yesterday. Says he was fine immediate post procedure and ate and drank but as the evening progressed so did the pain from the esophagus down to the stomach. This morning main discomfort is mid chest. Dr. Fuller Plan notified.

## 2020-02-08 ENCOUNTER — Telehealth: Payer: Self-pay | Admitting: *Deleted

## 2020-02-08 NOTE — Telephone Encounter (Signed)
Please review all the following with him for mgmt of GERD with esophagitis. - Closely follow all antireflux measures. - Pantoprazole to 40 mg po bid 30 minutes ac - Famotidine 40 mg po hs - Carafate 1g tablets crushed in water to make slurry po qid pc for 5 days then qid prn - Mylanta po qid prn - No aspirin, ibuprofen, naproxen, or other non-steroidal anti-inflammatory drugs. - Await pathology results. - Return to GI office in 1 month with me or Alonza Bogus, PA-C.  [facility]

## 2020-02-08 NOTE — Telephone Encounter (Signed)
1. Have you developed a fever since your procedure? no  2.   Have you had an respiratory symptoms (SOB or cough) since your procedure? no  3.   Have you tested positive for COVID 19 since your procedure no  4.   Have you had any family members/close contacts diagnosed with the COVID 19 since your procedure?  no   If yes to any of these questions please route to Joylene John, RN and Joella Prince, RN Follow up Call-  Call back number 02/06/2020  Post procedure Call Back phone  # [phone]  Permission to leave phone message Yes  Some recent data might be hidden     Patient questions:  Do you have a fever, pain , or abdominal swelling? Yes.   Pain Score  5 *  Have you tolerated food without any problems? Yes.    Have you been able to return to your normal activities? Yes.    Do you have any questions about your discharge instructions: Diet   No. Medications  No. Follow up visit  No.  Do you have questions or concerns about your Care? No.  Actions: * If pain score is 4 or above: Physician/ provider Notified : Dr Milana Kidney c/o still having chest discomfort 5/10 and says discomfort was 7/10 yesterday Pt says he spoke with a physician/nurse yesterday and was given Rx for a liquid medicine which sounds like .Sulcrafate which pt has taken x1 . Encouraged pt to continue this medicine as well as his reflux medication he already has and that I will notify Dr Lucianne Lei this continued discomfort.

## 2020-02-08 NOTE — Telephone Encounter (Signed)
I will,thank you

## 2020-02-08 NOTE — Telephone Encounter (Signed)
Called pt back and reviewed  Dr Lynne Leader instructions ( see this phone note) with pt and pt understood .

## 2020-02-11 ENCOUNTER — Encounter: Payer: Self-pay | Admitting: Gastroenterology

## 2020-03-11 ENCOUNTER — Other Ambulatory Visit: Payer: Self-pay

## 2020-03-11 ENCOUNTER — Other Ambulatory Visit: Payer: Self-pay | Admitting: *Deleted

## 2020-03-11 ENCOUNTER — Telehealth (INDEPENDENT_AMBULATORY_CARE_PROVIDER_SITE_OTHER): Payer: 59 | Admitting: Family Medicine

## 2020-03-11 ENCOUNTER — Telehealth: Payer: Self-pay

## 2020-03-11 DIAGNOSIS — R221 Localized swelling, mass and lump, neck: Secondary | ICD-10-CM

## 2020-03-11 DIAGNOSIS — R591 Generalized enlarged lymph nodes: Secondary | ICD-10-CM

## 2020-03-11 DIAGNOSIS — H538 Other visual disturbances: Secondary | ICD-10-CM | POA: Diagnosis not present

## 2020-03-11 DIAGNOSIS — R519 Headache, unspecified: Secondary | ICD-10-CM | POA: Diagnosis not present

## 2020-03-11 DIAGNOSIS — R42 Dizziness and giddiness: Secondary | ICD-10-CM | POA: Diagnosis not present

## 2020-03-11 NOTE — Telephone Encounter (Signed)
Order placed

## 2020-03-11 NOTE — Progress Notes (Addendum)
Virtual Visit via Telephone Note  I connected with Dustin [name] on 03/11/20 at 12:00 PM EST by telephone and verified that I am speaking with the correct person using two identifiers.   I discussed the limitations, risks, security and privacy concerns of performing an evaluation and management service by telephone and the availability of in person appointments. I also discussed with the patient that there may be a patient responsible charge related to this service. The patient expressed understanding and agreed to proceed.  Location patient: home, [place] Location provider: work or home office Participants present for the call: patient, provider Patient did not have a visit with me in the prior 7 days to address this/these issue(s).   History of Present Illness:  Acute telemedicine visit for headaches: -"is not like any headaches I have had", has had this headache for 7 days - fluctuating -Symptoms include: bilateral, moves in location, dull and mild at times, sometimes sharp pain, sometimes vision feels  blurry, some times feels " floating", feels tired sometimes, photophobia and phonophobia -Denies: NV, fevers, weight loss, weakness, numbness -Has tried: goody powers, ibuprofen, excedrin -Pertinent past medical history: "bilateral headaches" per last visit with PCP, also listed in [place] along with vertigo, hx of LAD - has not resolved - reports enlarging and sore; reports chronic cough and drainage -no known sick contacts -Pertinent medication allergies: penicillin, biaxin, ceftin, tetracyclines -was in [place] 2 weeks ago -COVID-19 vaccine status: not vaccinated  Observations/Objective: No audible sounds of acute distress. I do not appreciate any SOB. Speech and thought processing are grossly intact. Patient reported vitals:  Assessment and Plan:  Acute intractable headache, unspecified headache type  Blurred vision  Dizziness  Neck mass  -we discussed possible serious and  likely etiologies, options for evaluation and workup, limitations of telemedicine visit vs in person visit, treatment, treatment risks and precautions. Pt prefers to treat via telemedicine empirically rather than in person at this moment.  However, given his concerns I did advise referral for further evaluation. For the acute headache with concerning associated symptoms of blurred vision and a "floating" sensation advise inperson evaluation today and discussed options for care. He is considering local [facility] vs [facility] and agrees to go promptly today via private vehicle. For the chronic LAD that he feels is enlarging advised referral to ENT, however, he reports PCP had plan for this. I sent message to PCP in mychart, and also advised pt if not contacted by PCP, to contact PCP or ENT promptly for further evaluation of this. He agrees to do so.   Advised to seek prompt in person care if worsening, new symptoms arise, or if is not improving with treatment. Advised of options for inperson care in case PCP office not available. Did let the patient know that I only do telemedicine shifts for [facility] on Tuesdays and Thursdays and advised a follow up visit with PCP or at an [facility] if has further questions or concerns.   Follow Up Instructions:  I did not refer this patient for an OV with me in the next 24 hours for this/these issue(s).  I discussed the assessment and treatment plan with the patient. The patient was provided an opportunity to ask questions and all were answered. The patient agreed with the plan and demonstrated an understanding of the instructions.   I spent 22 minutes on this encounter.   Lucretia Kern, DO

## 2020-03-11 NOTE — Telephone Encounter (Signed)
Please advise 

## 2020-03-11 NOTE — Telephone Encounter (Signed)
Ok to order US soft tissue neck for lymphadenopathy.  Algis Greenhouse. Jerline Pain, MD 03/11/2020 2:04 PM

## 2020-03-11 NOTE — Telephone Encounter (Signed)
Pt called stating he saw Dr. Maudie Mercury today at 45. Pt asked if Dr. Jerline Pain could place referral for pt to get some imaging done.

## 2020-03-13 ENCOUNTER — Telehealth: Payer: Self-pay

## 2020-03-13 NOTE — Telephone Encounter (Signed)
Tine from imaging called regarding orders that were put in for patient please return call

## 2020-03-14 ENCOUNTER — Encounter: Payer: Self-pay | Admitting: Family Medicine

## 2020-03-14 ENCOUNTER — Other Ambulatory Visit: Payer: Self-pay

## 2020-03-14 ENCOUNTER — Ambulatory Visit: Payer: 59 | Admitting: Family Medicine

## 2020-03-14 VITALS — BP 103/71 | HR 93 | Temp 98.5°F | Ht 71.0 in | Wt 180.0 lb

## 2020-03-14 DIAGNOSIS — R519 Headache, unspecified: Secondary | ICD-10-CM

## 2020-03-14 LAB — COMPREHENSIVE METABOLIC PANEL
AG Ratio: 1.9 (calc) (ref 1.0–2.5)
ALT: 30 U/L (ref 9–46)
AST: 16 U/L (ref 10–40)
Albumin: 4.8 g/dL (ref 3.6–5.1)
Alkaline phosphatase (APISO): 58 U/L (ref 36–130)
BUN: 17 mg/dL (ref 7–25)
CO2: 26 mmol/L (ref 20–32)
Calcium: 10 mg/dL (ref 8.6–10.3)
Chloride: 104 mmol/L (ref 98–110)
Creat: 1.01 mg/dL (ref 0.60–1.35)
Globulin: 2.5 g/dL (calc) (ref 1.9–3.7)
Glucose, Bld: 85 mg/dL (ref 65–99)
Potassium: 4.3 mmol/L (ref 3.5–5.3)
Sodium: 138 mmol/L (ref 135–146)
Total Bilirubin: 0.4 mg/dL (ref 0.2–1.2)
Total Protein: 7.3 g/dL (ref 6.1–8.1)

## 2020-03-14 LAB — CBC
HCT: 45.9 % (ref 38.5–50.0)
Hemoglobin: 16.2 g/dL (ref 13.2–17.1)
MCH: 32.5 pg (ref 27.0–33.0)
MCHC: 35.3 g/dL (ref 32.0–36.0)
MCV: 92 fL (ref 80.0–100.0)
MPV: 9.5 fL (ref 7.5–12.5)
Platelets: 302 10*3/uL (ref 140–400)
RBC: 4.99 10*6/uL (ref 4.20–5.80)
RDW: 13 % (ref 11.0–15.0)
WBC: 9 10*3/uL (ref 3.8–10.8)

## 2020-03-14 MED ORDER — PROCHLORPERAZINE MALEATE 10 MG PO TABS: 10.0000 mg | ORAL_TABLET | Freq: Two times a day (BID) | ORAL | 0 refills | Status: DC | PRN

## 2020-03-14 MED ORDER — DICLOFENAC SODIUM 75 MG PO TBEC: 75.0000 mg | DELAYED_RELEASE_TABLET | Freq: Two times a day (BID) | ORAL | 0 refills | Status: DC | PRN

## 2020-03-14 MED ORDER — KETOROLAC TROMETHAMINE 60 MG/2ML IM SOLN
60.0000 mg | Freq: Once | INTRAMUSCULAR | Status: AC
  Administered 2020-03-14: 60 mg via INTRAMUSCULAR

## 2020-03-14 NOTE — Patient Instructions (Signed)
It was very nice to see you today!  You could be having a migraine.  We will give you 60 mg of Toradol today.  Please take the diclofenac and Compazine later today after your CT scan if your symptoms persist.  Please make sure that you are getting plenty of fluids.  We will get a CT scan of your head and neck.  I will try to get this done today.  Take care, Dr Jerline Pain  Please try these tips to maintain a healthy lifestyle:   Eat at least 3 REAL meals and 1-2 snacks per day.  Aim for no more than 5 hours between eating.  If you eat breakfast, please do so within one hour of getting up.    Each meal should contain half fruits/vegetables, one quarter protein, and one quarter carbs (no bigger than a computer mouse)   Cut down on sweet beverages. This includes juice, soda, and sweet tea.     Drink at least 1 glass of water with each meal and aim for at least 8 glasses per day   Exercise at least 150 minutes every week.

## 2020-03-14 NOTE — Progress Notes (Signed)
   Dustin [name] is a 29 y.o. male who presents today for an office visit.  Assessment/Plan:  New/Acute Problems: Headache Consistent with possible migraine however this is not typical distribution or characteristic of pain for him.  Neuro exam is reassuring.  Given his associated symptoms including numbness and nausea, I think it is reasonable to obtain head imaging at this point to rule out bleed or other potential cause though atypical migraine is most likley cause of his symptoms.  We will give 60 mg of Toradol IM today.  Will check head CT without contrast.  Will give prescription for diclofenac and Compazine to use as needed assuming CT scan is negative.  Check CBC and CMET as well.  Lymphadenopathy We will attempt to get CT scan and echo at same time as above head CT.     Subjective:  HPI:  Patient here for headache follow-up.  Started 10 days ago.  Saw a provider 3 days ago virtually.  Was recommended to have in person evaluation.  Headache has been persistent for the past 10 days.  Usually worse in the later part of the day.  Pain radiates into his neck.  Describes a throbbing pain.  Tried several over-the-counter medications including Goody powder, Aleve, Advil with no improvement.  Has had some nausea.  Also some numbness in the back of his neck.        Objective:  Physical Exam: BP 103/71   Pulse 93   Temp 98.5 F (36.9 C) (Temporal)   Ht 5\' 11"  (1.803 m)   Wt 180 lb (81.6 kg)   SpO2 97%   BMI 25.10 kg/m   Gen: No acute distress, resting comfortably CV: Regular rate and rhythm with no murmurs appreciated Pulm: Normal work of breathing, clear to auscultation bilaterally with no crackles, wheezes, or rhonchi Neuro: Cranial nerves II through XII intact.  Strength 5 out of 5 in upper and lower extremities.  Reflexes 2+ and symmetric bilaterally. Neuro: Grossly normal, moves all extremities Psych: Normal affect and thought content      Eutha Cude M. Jerline Pain, MD 03/14/2020  12:59 PM

## 2020-03-17 ENCOUNTER — Other Ambulatory Visit: Payer: Self-pay

## 2020-03-17 ENCOUNTER — Emergency Department (HOSPITAL_COMMUNITY)
Admission: RE | Admit: 2020-03-17 | Discharge: 2020-03-17 | Disposition: A | Discharge status: Home / Self Care | Payer: 59 | Source: Ambulatory Visit | Attending: Family Medicine | Admitting: Family Medicine

## 2020-03-17 ENCOUNTER — Emergency Department (HOSPITAL_COMMUNITY)
Admission: EM | Admit: 2020-03-17 | Discharge: 2020-03-17 | Disposition: A | Discharge status: Home / Self Care | Payer: 59 | Attending: Emergency Medicine | Admitting: Emergency Medicine

## 2020-03-17 ENCOUNTER — Encounter (HOSPITAL_COMMUNITY): Payer: Self-pay | Admitting: Emergency Medicine

## 2020-03-17 DIAGNOSIS — G43009 Migraine without aura, not intractable, without status migrainosus: Secondary | ICD-10-CM | POA: Insufficient documentation

## 2020-03-17 DIAGNOSIS — F17210 Nicotine dependence, cigarettes, uncomplicated: Secondary | ICD-10-CM | POA: Diagnosis not present

## 2020-03-17 DIAGNOSIS — R519 Headache, unspecified: Secondary | ICD-10-CM

## 2020-03-17 DIAGNOSIS — J45909 Unspecified asthma, uncomplicated: Secondary | ICD-10-CM | POA: Diagnosis not present

## 2020-03-17 MED ORDER — ONDANSETRON HCL 4 MG/2ML IJ SOLN
4.0000 mg | Freq: Once | INTRAMUSCULAR | Status: AC
  Administered 2020-03-17: 4 mg via INTRAVENOUS
  Filled 2020-03-17: qty 2

## 2020-03-17 MED ORDER — IOHEXOL 300 MG/ML  SOLN
75.0000 mL | Freq: Once | INTRAMUSCULAR | Status: AC | PRN
  Administered 2020-03-17: 75 mL via INTRAVENOUS

## 2020-03-17 MED ORDER — SODIUM CHLORIDE 0.9 % IV BOLUS
1000.0000 mL | Freq: Once | INTRAVENOUS | Status: AC
  Administered 2020-03-17: 1000 mL via INTRAVENOUS

## 2020-03-17 MED ORDER — SODIUM CHLORIDE (PF) 0.9 % IJ SOLN
INTRAMUSCULAR | Status: AC
  Filled 2020-03-17: qty 50

## 2020-03-17 MED ORDER — DEXAMETHASONE SODIUM PHOSPHATE 10 MG/ML IJ SOLN
10.0000 mg | Freq: Once | INTRAMUSCULAR | Status: AC
  Administered 2020-03-17: 10 mg via INTRAVENOUS
  Filled 2020-03-17: qty 1

## 2020-03-17 MED ORDER — METOCLOPRAMIDE HCL 5 MG/ML IJ SOLN
10.0000 mg | Freq: Once | INTRAMUSCULAR | Status: AC
  Administered 2020-03-17: 10 mg via INTRAVENOUS
  Filled 2020-03-17: qty 2

## 2020-03-17 MED ORDER — ONDANSETRON HCL 4 MG PO TABS: 4.0000 mg | ORAL_TABLET | Freq: Three times a day (TID) | ORAL | 0 refills | Status: DC | PRN

## 2020-03-17 MED ORDER — DIPHENHYDRAMINE HCL 50 MG/ML IJ SOLN
25.0000 mg | Freq: Once | INTRAMUSCULAR | Status: AC
  Administered 2020-03-17: 25 mg via INTRAVENOUS
  Filled 2020-03-17: qty 1

## 2020-03-17 NOTE — ED Triage Notes (Signed)
Pt states he has had a headache for 13 days and that his MD "talked about getting a CT scan for his head and neck pain but that they haven't gotten that far yet".

## 2020-03-17 NOTE — ED Provider Notes (Signed)
[facility] EMERGENCY DEPARTMENT Provider Note   CSN: [id number] Arrival date & time: 03/17/20  0145   Time seen 3:53 AM  History Chief Complaint  Patient presents with  . Headache    Dustin [name] is a 29 y.o. male.  HPI   Patient states he woke up about 13 days ago of a headache that moves around and can be in different places of his head but currently it is holoacranial.  He states it can be dull, sharp, and throbbing.  He states the headache seems worse in the evening and nighttime and when he wakes up in the morning its not as bad.  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  He denies nausea, vomiting, photophobia, numbness or tingling of his extremities.  He does describe some phonophobia.  He states he has had headaches in the past but they generally only last a couple of hours.  He denies fever, sore throat, rhinorrhea, cough.  Patient states he is right-handed.  Patient states he saw his PCP last week and got Toradol which helped his headache for a whole day but then it returned.  He was then prescribed diclofenac and he took 1 this morning, December 12 but he reports since then he feels like his whole body is burning and he feels dizzy and lightheaded feels like his vision is worse.  He also states it made his headache worse.  He states he feels like his skull is on fire.  Patient is scheduled for outpatient CT scan and ultrasound of his neck.  PCP Vivi Barrack, MD   Past Medical History:  Diagnosis Date  . Alcoholism in recovery ([place])   . Asthma   . Bilateral headaches 01/27/2015  . Headache   . Hearing loss   . IBS (irritable bowel syndrome)   . POTS (postural orthostatic tachycardia syndrome) 02/13/2016  . Vertigo     Patient Active Problem List   Diagnosis Date Noted  . Gastroesophageal reflux disease 12/27/2019  . Dysphagia 12/27/2019  . Generalized  abdominal pain 12/27/2019  . Pyrosis 12/27/2019  . Diarrhea 02/26/2019  . Irritability 12/05/2018  . Allergic rhinitis 12/05/2018  . POTS (postural orthostatic tachycardia syndrome) 02/13/2016  . Epistaxis 10/28/2015  . Bilateral headaches 01/27/2015    Past Surgical History:  Procedure Laterality Date  . INGUINAL HERNIA REPAIR    . SINUS ENDO W/FUSION    . WISDOM TOOTH EXTRACTION         Family History  Problem Relation Age of Onset  . Esophageal cancer Father 58  . Migraines Mother   . Lung cancer Paternal Grandfather     Social History   Tobacco Use  . Smoking status: Current Every Day Smoker    Packs/day: 0.50    Types: Cigarettes  . Smokeless tobacco: Never Used  Vaping Use  . Vaping Use: Every day  . Substances: Nicotine  Substance Use Topics  . Alcohol use: Not Currently    Alcohol/week: 0.0 standard drinks  . Drug use: No  employed  Home Medications Prior to Admission medications   Medication Sig Start Date End Date Taking? Authorizing Provider  diclofenac (VOLTAREN) 75 MG EC tablet Take 1 tablet (75 mg total) by mouth 2 (two) times daily as needed (with compazine for migraine). 03/14/20   Vivi Barrack, MD  DULoxetine (CYMBALTA) 60 MG capsule Take 1 capsule (  60 mg total) by mouth daily. 07/09/16   Dohmeier, Asencion Partridge, MD  loratadine (CLARITIN) 10 MG tablet TAKE 1 TABLET(10 MG) BY MOUTH DAILY Patient taking differently: Take 10 mg by mouth daily as needed for allergies. 06/29/16   Dohmeier, Asencion Partridge, MD  omeprazole (PRILOSEC) 40 MG capsule Take 1 capsule (40 mg total) by mouth daily. 12/27/19   Zehr, Laban Emperor, PA-C  ondansetron (ZOFRAN) 4 MG tablet Take 1 tablet (4 mg total) by mouth every 8 (eight) hours as needed for nausea or vomiting. 03/17/20   Rolland Porter, MD  pantoprazole (PROTONIX) 40 MG tablet Take 40 mg by mouth daily. 10/16/19   [provider]  prochlorperazine (COMPAZINE) 10 MG tablet Take 1 tablet (10 mg total) by mouth 2 (two) times daily as  needed (migraine with diclofenac). 03/14/20   Vivi Barrack, MD  ranitidine (ZANTAC) 75 MG tablet Take 150 mg by mouth at bedtime.    [provider]  sucralfate (CARAFATE) 1 g tablet Take 1 tablet (1 g total) by mouth 4 (four) times daily for 5 days. 02/07/20 02/12/20  Ladene Artist, MD    Allergies    Biaxin [clarithromycin], Ceftin [cefuroxime axetil], Penicillins, and Tetracyclines & related  Review of Systems   Review of Systems  All other systems reviewed and are negative.   Physical Exam Updated Vital Signs BP 116/74 (BP Location: Left Arm)   Pulse 77   Temp 98.7 F (37.1 C) (Oral)   Resp 17   Ht 5\' 11"  (1.803 m)   Wt 81.6 kg   SpO2 98%   BMI 25.10 kg/m   Physical Exam Vitals and nursing note reviewed.  Constitutional:      General: He is not in acute distress.    Appearance: He is well-developed and normal weight. He is not ill-appearing or toxic-appearing.  HENT:     Head: Normocephalic and atraumatic.     Right Ear: External ear normal.     Left Ear: External ear normal.     Nose: Nose normal.     Mouth/Throat:     Mouth: Mucous membranes are moist.     Pharynx: No oropharyngeal exudate or posterior oropharyngeal erythema.  Eyes:     Extraocular Movements: Extraocular movements intact.     Conjunctiva/sclera: Conjunctivae normal.     Pupils: Pupils are equal, round, and reactive to light.  Cardiovascular:     Rate and Rhythm: Normal rate.  Pulmonary:     Effort: Pulmonary effort is normal. No respiratory distress.  Musculoskeletal:        General: Normal range of motion.     Cervical back: Normal range of motion and neck supple.  Skin:    General: Skin is warm and dry.  Neurological:     General: No focal deficit present.     Mental Status: He is alert and oriented to person, place, and time.     Cranial Nerves: No cranial nerve deficit.  Psychiatric:        Mood and Affect: Mood normal.        Behavior: Behavior normal.        Thought  Content: Thought content normal.     Visual Acuity    04:19:56 ED Notes JF  visual acuity L 20/10 R 20/10     ED Results / Procedures / Treatments   Labs (all labs ordered are listed, but only abnormal results are displayed) Labs Reviewed - No data to display  EKG None  Radiology  No results found.  Procedures Procedures (including critical care time)  Medications Ordered in ED Medications  sodium chloride 0.9 % bolus 1,000 mL (0 mLs Intravenous Stopped 03/17/20 0554)  metoCLOPramide (REGLAN) injection 10 mg (10 mg Intravenous Given 03/17/20 0425)  diphenhydrAMINE (BENADRYL) injection 25 mg (25 mg Intravenous Given 03/17/20 0425)  dexamethasone (DECADRON) injection 10 mg (10 mg Intravenous Given 03/17/20 0426)  ondansetron (ZOFRAN) injection 4 mg (4 mg Intravenous Given 03/17/20 0551)    ED Course  I have reviewed the triage vital signs and the nursing notes.  Pertinent labs & imaging results that were available during my care of the patient were reviewed by me and considered in my medical decision making (see chart for details).    MDM Rules/Calculators/A&P                         Patient's headache does not sound like it is going to be pathological because the headache changes positions on his head.  This sounds like his prior goal be some type of migraine type headache.  The other thing to consider would be if he needs to have his eyes checked.  Patient was given migraine cocktail.  Recheck at 5:20 AM patient states his headaches better, he is complaining of a lot of nausea however still.  He was given Zofran IV.  Recheck at 620 after getting IV Zofran he states he feels better and feels ready to be discharged home.  He can follow-up and get his outpatient CT scan and ultrasounds done.  Final Clinical Impression(s) / ED Diagnoses Final diagnoses:  Migraine without aura and without status migrainosus, not intractable    Rx / DC Orders ED Discharge Orders          Ordered    ondansetron (ZOFRAN) 4 MG tablet  Every 8 hours PRN        03/17/20 0624         Plan discharge  Rolland Porter, MD, Barbette Or, MD 03/17/20 [phone]

## 2020-03-17 NOTE — Discharge Instructions (Addendum)
Please call [phone] to get your outpatient ultrasounds and CT scans scheduled that your primary care doctor has already ordered for you.  Use the Zofran as needed for nausea.

## 2020-03-17 NOTE — ED Notes (Signed)
visual acuity L 20/10 R 20/10

## 2020-03-18 ENCOUNTER — Encounter: Payer: Self-pay | Admitting: Family Medicine

## 2020-03-18 ENCOUNTER — Other Ambulatory Visit: Payer: Self-pay

## 2020-03-18 MED ORDER — AMOXICILLIN-POT CLAVULANATE 875-125 MG PO TABS: 1.0000 | ORAL_TABLET | Freq: Two times a day (BID) | ORAL | 0 refills | Status: DC

## 2020-03-18 NOTE — Telephone Encounter (Signed)
Please advise 

## 2020-03-18 NOTE — Progress Notes (Signed)
Please inform patient of the following:  He did not have any enlarged lymph nodes on his CT scan of his neck.  Do not need to perform any further testing at this point.   Dustin [name]. Jerline Pain, MD 03/18/2020 8:41 AM

## 2020-03-18 NOTE — Progress Notes (Signed)
Please inform patient of the following:  Blood work is NORMAL.  Algis Greenhouse. Jerline Pain, MD 03/18/2020 8:42 AM

## 2020-03-18 NOTE — Telephone Encounter (Signed)
Has this been handled?

## 2020-03-18 NOTE — Progress Notes (Signed)
Please inform patient of the following:  HE has some sinus inflammation but otherwise his head Ct is normal. Would like for him to let us know if his headache has not improved.  Dustin [name]. Jerline Pain, MD 03/18/2020 8:38 AM

## 2020-03-18 NOTE — Telephone Encounter (Signed)
Pt has allergy to Penicillins, okay to give Augmentin?

## 2020-03-18 NOTE — Telephone Encounter (Signed)
Spoke with pt to give message below. Pt verbalized understanding, and was able to repeat instruction.

## 2020-03-19 ENCOUNTER — Other Ambulatory Visit: Payer: Self-pay | Admitting: *Deleted

## 2020-03-19 MED ORDER — LEVOFLOXACIN 500 MG PO TABS: 500.0000 mg | ORAL_TABLET | Freq: Every day | ORAL | 0 refills | Status: DC

## 2020-03-19 NOTE — Telephone Encounter (Signed)
Rx levaquin send to pharmacy  Advise to stop taking Rx Augmentin  Pt stated not taking it since yesterday

## 2020-03-19 NOTE — Telephone Encounter (Signed)
Patient stated the medication made him very sick and would like something else sent in.

## 2020-03-19 NOTE — Telephone Encounter (Signed)
Pt is following up on this and needing new antibiotic asap

## 2020-03-24 ENCOUNTER — Other Ambulatory Visit: Payer: Self-pay | Admitting: *Deleted

## 2020-03-24 ENCOUNTER — Encounter: Payer: Self-pay | Admitting: Family Medicine

## 2020-03-24 NOTE — Telephone Encounter (Signed)
Please advise 

## 2020-03-25 ENCOUNTER — Ambulatory Visit
Admission: RE | Admit: 2020-03-25 | Discharge: 2020-03-25 | Disposition: A | Discharge status: Home / Self Care | Payer: 59 | Source: Ambulatory Visit | Attending: Family Medicine | Admitting: Family Medicine

## 2020-03-25 DIAGNOSIS — R591 Generalized enlarged lymph nodes: Secondary | ICD-10-CM

## 2020-03-26 ENCOUNTER — Other Ambulatory Visit: Payer: Self-pay | Admitting: *Deleted

## 2020-03-26 DIAGNOSIS — R591 Generalized enlarged lymph nodes: Secondary | ICD-10-CM

## 2020-03-26 DIAGNOSIS — R519 Headache, unspecified: Secondary | ICD-10-CM

## 2020-03-26 NOTE — Progress Notes (Signed)
ent

## 2020-03-26 NOTE — Progress Notes (Signed)
surgen

## 2020-03-26 NOTE — Telephone Encounter (Signed)
ENT referral placed.

## 2020-03-26 NOTE — Progress Notes (Signed)
Please inform patient of the following:  His ultrasound showed a likely benign lymph node but given that he has had ongoing issues for months I recommend that we send him to surgeon to discuss biopsy. Please place referral to surgery for lymphadenopathy.  Dustin [name]. Jerline Pain, MD 03/26/2020 9:18 AM

## 2020-03-31 ENCOUNTER — Telehealth: Payer: Self-pay

## 2020-03-31 NOTE — Telephone Encounter (Signed)
Pt is still feeling unwell. Zpak prescription has not been sent in.

## 2020-04-01 ENCOUNTER — Other Ambulatory Visit: Payer: Self-pay | Admitting: *Deleted

## 2020-04-01 ENCOUNTER — Telehealth: Payer: Self-pay | Admitting: *Deleted

## 2020-04-01 MED ORDER — AZITHROMYCIN 250 MG PO TABS: ORAL_TABLET | ORAL | 0 refills | Status: DC

## 2020-04-01 NOTE — Telephone Encounter (Signed)
500mg  for 1 day then 250mg  daily for 4 days. I believe stella was going to send this in.  . , MD 04/01/2020 11:01 AM

## 2020-04-01 NOTE — Telephone Encounter (Signed)
Triage done. See note  Z-pac order

## 2020-04-01 NOTE — Telephone Encounter (Signed)
If he is having chest pain and shortness of breath he needs to be triaged.  Dustin [name]. Jimmey Ralph, MD 04/01/2020 2:28 PM

## 2020-04-01 NOTE — Telephone Encounter (Signed)
Spoke with patient, patient stated he do not want to go to ED due to high cost. Advise to go to UC if [facility] persist  Patient using inhaler, work some but [facility] continue with excertion   Z-Pac send to pharmacy

## 2020-04-01 NOTE — Telephone Encounter (Signed)
Please advise 

## 2020-04-01 NOTE — Telephone Encounter (Signed)
Ok with me. Please place any necessary orders. 

## 2020-04-01 NOTE — Telephone Encounter (Signed)
See below

## 2020-04-01 NOTE — Telephone Encounter (Signed)
Pt called following up on z-pack. Pt also is complaining of cough & shortness of breath/burning in lungs. Pt states he is having to sit up in order to sleep. Please advise.

## 2020-04-01 NOTE — Telephone Encounter (Signed)
What are the instructions for the Zpack?

## 2020-04-01 NOTE — Telephone Encounter (Signed)
Pt called in checking when medication will be sent in

## 2020-04-14 ENCOUNTER — Ambulatory Visit (INDEPENDENT_AMBULATORY_CARE_PROVIDER_SITE_OTHER): Payer: 59 | Admitting: Otolaryngology

## 2020-04-14 ENCOUNTER — Other Ambulatory Visit: Payer: Self-pay

## 2020-04-14 ENCOUNTER — Encounter (INDEPENDENT_AMBULATORY_CARE_PROVIDER_SITE_OTHER): Payer: Self-pay | Admitting: Otolaryngology

## 2020-04-14 VITALS — Temp 97.9°F

## 2020-04-14 DIAGNOSIS — R590 Localized enlarged lymph nodes: Secondary | ICD-10-CM

## 2020-04-14 DIAGNOSIS — G43809 Other migraine, not intractable, without status migrainosus: Secondary | ICD-10-CM | POA: Diagnosis not present

## 2020-04-14 DIAGNOSIS — J310 Chronic rhinitis: Secondary | ICD-10-CM

## 2020-04-14 NOTE — Progress Notes (Signed)
HPI: Dustin [name] is a 30 y.o. male who presents is referred by his PCP Dr. Jerline Pain for evaluation of sinus as well as lymphadenopathy in his neck.  Patient was having a lot of migraine headaches recently and underwent a CT scan of his head to evaluate headaches.  He has previously been diagnosed with migraine headaches.  He also noticed some slightly enlarged lymph nodes in his neck which were a little tender more on the left side.  He had an ultrasound initially of the lymph nodes in his neck that showed mildly enlarged cervical lymph nodes that were most likely reactive measuring approximately 2.4 cm x 1.8 cm.  He subsequently had a CT scan of his head and a CT scan of his neck performed on 03/17/2020 that showed minimal mucosal thickening within the ethmoid sinuses and no significant lymphadenopathy in the neck.  I reviewed the CT scans with the patient in the office today.  Of note he had previous sinus surgery performed 6 years ago. He does not complain of any difficulty breathing through his nose and has had no yellow-green discharge from the nose. On review of the CT scan of his head he still has a slight septal deviation to the right but mostly clear paranasal sinuses with mild mucosal thickening within the ethmoid region but no significant opacification and no air-fluid levels.  Past Medical History:  Diagnosis Date  . Alcoholism in recovery ([place])   . Asthma   . Bilateral headaches 01/27/2015  . Headache   . Hearing loss   . IBS (irritable bowel syndrome)   . POTS (postural orthostatic tachycardia syndrome) 02/13/2016  . Vertigo    Past Surgical History:  Procedure Laterality Date  . INGUINAL HERNIA REPAIR    . SINUS ENDO W/FUSION    . WISDOM TOOTH EXTRACTION     Social History   Socioeconomic History  . Marital status: Married    Spouse name: [name]  . Number of children: 1  . Years of education: [employer]  . Highest education level: Not on file  Occupational History  . Not  on file  Tobacco Use  . Smoking status: Current Every Day Smoker    Packs/day: 0.50    Years: 13.00    Pack years: 6.50    Types: Cigarettes    Start date: 2008  . Smokeless tobacco: Never Used  . Tobacco comment: smoked off and on   Vaping Use  . Vaping Use: Every day  . Substances: Nicotine  Substance and Sexual Activity  . Alcohol use: Not Currently    Alcohol/week: 0.0 standard drinks  . Drug use: No  . Sexual activity: Yes    Partners: Female  Other Topics Concern  . Not on file  Social History Narrative   Patient lives at home with his wife [name]).   Patient is in the [employer]    Right handed.   Caffeine sweet tea once daily.   Social Determinants of Health   Financial Resource Strain: Not on file  Food Insecurity: Not on file  Transportation Needs: Not on file  Physical Activity: Not on file  Stress: Not on file  Social Connections: Not on file   Family History  Problem Relation Age of Onset  . Esophageal cancer Father 47  . Migraines Mother   . Lung cancer Paternal Grandfather    Allergies  Allergen Reactions  . Amoxicillin   . Biaxin [Clarithromycin] Hives  . Ceftin [Cefuroxime Axetil] Hives  .  Penicillins Hives    Has patient had a PCN reaction causing immediate rash, facial/tongue/throat swelling, SOB or lightheadedness with hypotension: Yes Has patient had a PCN reaction causing severe rash involving mucus membranes or skin necrosis: Yes Has patient had a PCN reaction that required hospitalization: No Has patient had a PCN reaction occurring within the last 10 years: No If all of the above answers are "NO", then may proceed with Cephalosporin use.   . Tetracyclines & Related Hives   Prior to Admission medications   Medication Sig Start Date End Date Taking? Authorizing Provider  amoxicillin-clavulanate (AUGMENTIN) 875-125 MG tablet Take 1 tablet by mouth 2 (two) times daily. 03/18/20   Vivi Barrack, MD  azithromycin (ZITHROMAX) 250 MG tablet  Take 2 (500mg )  tabs on day one, Then 1 tab daily for 4 days 04/01/20   Vivi Barrack, MD  diclofenac (VOLTAREN) 75 MG EC tablet Take 1 tablet (75 mg total) by mouth 2 (two) times daily as needed (with compazine for migraine). 03/14/20   Vivi Barrack, MD  DULoxetine (CYMBALTA) 60 MG capsule Take 1 capsule (60 mg total) by mouth daily. 07/09/16   Dohmeier, Asencion Partridge, MD  levofloxacin (LEVAQUIN) 500 MG tablet Take 1 tablet (500 mg total) by mouth daily. 03/19/20   Vivi Barrack, MD  loratadine (CLARITIN) 10 MG tablet TAKE 1 TABLET(10 MG) BY MOUTH DAILY Patient taking differently: Take 10 mg by mouth daily as needed for allergies. 06/29/16   Dohmeier, Asencion Partridge, MD  omeprazole (PRILOSEC) 40 MG capsule Take 1 capsule (40 mg total) by mouth daily. 12/27/19   Zehr, Laban Emperor, PA-C  ondansetron (ZOFRAN) 4 MG tablet Take 1 tablet (4 mg total) by mouth every 8 (eight) hours as needed for nausea or vomiting. 03/17/20   Rolland Porter, MD  pantoprazole (PROTONIX) 40 MG tablet Take 40 mg by mouth daily. 10/16/19   [provider]  prochlorperazine (COMPAZINE) 10 MG tablet Take 1 tablet (10 mg total) by mouth 2 (two) times daily as needed (migraine with diclofenac). 03/14/20   Vivi Barrack, MD  ranitidine (ZANTAC) 75 MG tablet Take 150 mg by mouth at bedtime.    [provider]  sucralfate (CARAFATE) 1 g tablet Take 1 tablet (1 g total) by mouth 4 (four) times daily for 5 days. 02/07/20 02/12/20  Ladene Artist, MD     Positive ROS: Otherwise negative  All other systems have been reviewed and were otherwise negative with the exception of those mentioned in the HPI and as above.  Physical Exam: Constitutional: Alert, well-appearing, no acute distress Ears: External ears without lesions or tenderness. Ear canals are clear bilaterally with intact, clear TMs.  Nasal: External nose without lesions. Septum deviated to the right with mild rhinitis.  After decongesting the nose both millimeters regions  were clear with no evidence of mucopurulent discharge no polyps and mild edema.. Oral: Lips and gums without lesions. Tongue and palate mucosa without lesions. Posterior oropharynx clear.  Tonsils are 2+ and symmetric bilaterally with no exudate. Neck: On palpation of the neck he has mildly enlarged upper cervical lymph nodes or jugular nodes on both sides.  He states that the left side is slightly tender to palpation.  On palpation these are not significantly enlarged.  He has no supraclavicular adenopathy noted.  And no adenopathy in the posterior neck on either side. Respiratory: Breathing comfortably  Skin: No facial/neck lesions or rash noted.  Procedures  Assessment: Chronic rhinitis with no significant evidence  of acute sinus infection. History of migraine headaches Adenopathy is mostly consistent with reactive lymphadenopathy and does not appear to be neoplastic on clinical exam.  Plan: Recommended use of Nasacort 2 sprays each nostril at night as this will help some with any nasal congestion in mild swelling within the ethmoid regions. Not sure any further therapy is needed for the mildly enlarged lymphadenopathy unless this continues to enlarge. He will follow-up as needed   Radene Journey, MD   CC:

## 2020-04-17 ENCOUNTER — Ambulatory Visit
Admission: RE | Admit: 2020-04-17 | Discharge: 2020-04-17 | Disposition: A | Discharge status: Home / Self Care | Payer: 59 | Source: Ambulatory Visit | Attending: Family Medicine | Admitting: Family Medicine

## 2020-04-17 ENCOUNTER — Other Ambulatory Visit: Payer: Self-pay | Admitting: Family Medicine

## 2020-04-17 ENCOUNTER — Other Ambulatory Visit: Payer: Self-pay

## 2020-04-17 DIAGNOSIS — R0789 Other chest pain: Secondary | ICD-10-CM

## 2020-08-06 ENCOUNTER — Ambulatory Visit (INDEPENDENT_AMBULATORY_CARE_PROVIDER_SITE_OTHER): Payer: 59 | Admitting: Clinical

## 2020-08-06 DIAGNOSIS — F410 Panic disorder [episodic paroxysmal anxiety] without agoraphobia: Secondary | ICD-10-CM | POA: Diagnosis not present

## 2020-08-06 DIAGNOSIS — F419 Anxiety disorder, unspecified: Secondary | ICD-10-CM | POA: Diagnosis not present

## 2020-09-11 ENCOUNTER — Ambulatory Visit (INDEPENDENT_AMBULATORY_CARE_PROVIDER_SITE_OTHER): Payer: 59 | Admitting: Clinical

## 2020-09-11 DIAGNOSIS — F419 Anxiety disorder, unspecified: Secondary | ICD-10-CM

## 2020-10-01 ENCOUNTER — Ambulatory Visit: Payer: 59 | Admitting: Clinical

## 2020-10-01 ENCOUNTER — Other Ambulatory Visit: Payer: Self-pay

## 2020-10-01 ENCOUNTER — Ambulatory Visit
Admission: RE | Admit: 2020-10-01 | Discharge: 2020-10-01 | Disposition: A | Discharge status: Home / Self Care | Payer: 59 | Source: Ambulatory Visit | Attending: Family Medicine | Admitting: Family Medicine

## 2020-10-01 ENCOUNTER — Other Ambulatory Visit: Payer: Self-pay | Admitting: Family Medicine

## 2020-10-01 DIAGNOSIS — K5792 Diverticulitis of intestine, part unspecified, without perforation or abscess without bleeding: Secondary | ICD-10-CM

## 2020-11-03 ENCOUNTER — Encounter (HOSPITAL_BASED_OUTPATIENT_CLINIC_OR_DEPARTMENT_OTHER): Payer: Self-pay | Admitting: Obstetrics and Gynecology

## 2020-11-03 ENCOUNTER — Emergency Department (HOSPITAL_BASED_OUTPATIENT_CLINIC_OR_DEPARTMENT_OTHER): Payer: 59

## 2020-11-03 ENCOUNTER — Other Ambulatory Visit: Payer: Self-pay

## 2020-11-03 ENCOUNTER — Emergency Department (HOSPITAL_BASED_OUTPATIENT_CLINIC_OR_DEPARTMENT_OTHER)
Admission: EM | Admit: 2020-11-03 | Discharge: 2020-11-03 | Disposition: A | Discharge status: Home / Self Care | Payer: 59 | Attending: Emergency Medicine | Admitting: Emergency Medicine

## 2020-11-03 DIAGNOSIS — R000 Tachycardia, unspecified: Secondary | ICD-10-CM | POA: Diagnosis not present

## 2020-11-03 DIAGNOSIS — F17210 Nicotine dependence, cigarettes, uncomplicated: Secondary | ICD-10-CM | POA: Diagnosis not present

## 2020-11-03 DIAGNOSIS — K219 Gastro-esophageal reflux disease without esophagitis: Secondary | ICD-10-CM | POA: Insufficient documentation

## 2020-11-03 DIAGNOSIS — R1030 Lower abdominal pain, unspecified: Secondary | ICD-10-CM

## 2020-11-03 DIAGNOSIS — J45909 Unspecified asthma, uncomplicated: Secondary | ICD-10-CM | POA: Diagnosis not present

## 2020-11-03 DIAGNOSIS — R110 Nausea: Secondary | ICD-10-CM | POA: Insufficient documentation

## 2020-11-03 DIAGNOSIS — R197 Diarrhea, unspecified: Secondary | ICD-10-CM | POA: Insufficient documentation

## 2020-11-03 DIAGNOSIS — Z8719 Personal history of other diseases of the digestive system: Secondary | ICD-10-CM | POA: Insufficient documentation

## 2020-11-03 DIAGNOSIS — R1031 Right lower quadrant pain: Secondary | ICD-10-CM | POA: Diagnosis present

## 2020-11-03 DIAGNOSIS — R1032 Left lower quadrant pain: Secondary | ICD-10-CM | POA: Insufficient documentation

## 2020-11-03 LAB — COMPREHENSIVE METABOLIC PANEL
ALT: 38 U/L (ref 0–44)
AST: 19 U/L (ref 15–41)
Albumin: 5 g/dL (ref 3.5–5.0)
Alkaline Phosphatase: 48 U/L (ref 38–126)
Anion gap: 11 (ref 5–15)
BUN: 16 mg/dL (ref 6–20)
CO2: 27 mmol/L (ref 22–32)
Calcium: 9.6 mg/dL (ref 8.9–10.3)
Chloride: 103 mmol/L (ref 98–111)
Creatinine, Ser: 1.1 mg/dL (ref 0.61–1.24)
GFR, Estimated: >60 mL/min (ref >60)
Glucose, Bld: 82 mg/dL (ref 70–99)
Potassium: 3.7 mmol/L (ref 3.5–5.1)
Sodium: 141 mmol/L (ref 135–145)
Total Bilirubin: 0.5 mg/dL (ref 0.3–1.2)
Total Protein: 7.5 g/dL (ref 6.5–8.1)

## 2020-11-03 LAB — CBC
HCT: 42.5 % (ref 39.0–52.0)
Hemoglobin: 15.4 g/dL (ref 13.0–17.0)
MCH: 32.4 pg (ref 26.0–34.0)
MCHC: 36.2 g/dL — ABNORMAL HIGH (ref 30.0–36.0)
MCV: 89.3 fL (ref 80.0–100.0)
Platelets: 273 10*3/uL (ref 150–400)
RBC: 4.76 MIL/uL (ref 4.22–5.81)
RDW: 11.9 % (ref 11.5–15.5)
WBC: 11.2 10*3/uL — ABNORMAL HIGH (ref 4.0–10.5)
nRBC: 0 % (ref 0.0–0.2)

## 2020-11-03 LAB — LIPASE, BLOOD: Lipase: 25 U/L (ref 11–51)

## 2020-11-03 LAB — URINALYSIS, ROUTINE W REFLEX MICROSCOPIC
Bilirubin Urine: NEGATIVE
Glucose, UA: NEGATIVE mg/dL
Hgb urine dipstick: NEGATIVE
Ketones, ur: NEGATIVE mg/dL
Leukocytes,Ua: NEGATIVE
Nitrite: NEGATIVE
Protein, ur: NEGATIVE mg/dL
Specific Gravity, Urine: 1.02 (ref 1.005–1.030)
pH: 6 (ref 5.0–8.0)

## 2020-11-03 MED ORDER — IOHEXOL 300 MG/ML  SOLN
100.0000 mL | Freq: Once | INTRAMUSCULAR | Status: AC | PRN
  Administered 2020-11-03: 100 mL via INTRAVENOUS

## 2020-11-03 MED ORDER — DICYCLOMINE HCL 20 MG PO TABS: 20.0000 mg | ORAL_TABLET | Freq: Two times a day (BID) | ORAL | 0 refills | Status: DC

## 2020-11-03 MED ORDER — LACTATED RINGERS IV BOLUS
1000.0000 mL | Freq: Once | INTRAVENOUS | Status: AC
  Administered 2020-11-03: 1000 mL via INTRAVENOUS

## 2020-11-03 MED ORDER — ONDANSETRON HCL 4 MG/2ML IJ SOLN
4.0000 mg | Freq: Once | INTRAMUSCULAR | Status: AC
  Administered 2020-11-03: 4 mg via INTRAVENOUS
  Filled 2020-11-03: qty 2

## 2020-11-03 NOTE — ED Provider Notes (Signed)
[facility] EMERGENCY DEPT Provider Note   CSN: [id number] Arrival date & time: 11/03/20  1708     History Chief Complaint  Patient presents with   Abdominal Pain    Dustin [name] is a 30 y.o. male.  Patient is a 30 year old male with a history of GERD, IBS, POTS and prior abdominal pain who is presenting today with complaints of recurrent abdominal pain in the right lower quadrant.  Pain started on Friday and has persisted and now is evolving bilateral lower quadrants.  He has had nausea but no vomiting.  Minimal diarrhea.  No fevers.  Patient reports approximately a month ago he had similar symptoms and that time had a CT that showed epiploic appendagitis.  He did take antibiotics and report pain went away until now.  He denies any urinary symptoms.  No cough, congestion.  Movement seems to make the pain worse but it does not radiate.  He has been eating normally.  The history is provided by the patient and medical records.  Abdominal Pain     Past Medical History:  Diagnosis Date   Alcoholism in recovery [facility])    Asthma    Bilateral headaches 01/27/2015   Headache    Hearing loss    IBS (irritable bowel syndrome)    POTS (postural orthostatic tachycardia syndrome) 02/13/2016   Vertigo     Patient Active Problem List   Diagnosis Date Noted   Gastroesophageal reflux disease 12/27/2019   Dysphagia 12/27/2019   Generalized abdominal pain 12/27/2019   Pyrosis 12/27/2019   Diarrhea 02/26/2019   Irritability 12/05/2018   Allergic rhinitis 12/05/2018   POTS (postural orthostatic tachycardia syndrome) 02/13/2016   Epistaxis 10/28/2015   Bilateral headaches 01/27/2015    Past Surgical History:  Procedure Laterality Date   INGUINAL HERNIA REPAIR     SINUS ENDO W/FUSION     WISDOM TOOTH EXTRACTION         Family History  Problem Relation Age of Onset   Esophageal cancer Father 64   Migraines Mother    Lung cancer Paternal Grandfather     Social  History   Tobacco Use   Smoking status: Every Day    Packs/day: 0.50    Years: 13.00    Pack years: 6.50    Types: Cigarettes    Start date: 2008   Smokeless tobacco: Never   Tobacco comments:    smoked off and on   Vaping Use   Vaping Use: Every day   Substances: Nicotine, Flavoring  Substance Use Topics   Alcohol use: Not Currently    Alcohol/week: 0.0 standard drinks   Drug use: No    Home Medications Prior to Admission medications   Medication Sig Start Date End Date Taking? Authorizing Provider  amoxicillin-clavulanate (AUGMENTIN) 875-125 MG tablet Take 1 tablet by mouth 2 (two) times daily. 03/18/20   Vivi Barrack, MD  azithromycin (ZITHROMAX) 250 MG tablet Take 2 ('500mg'$ )  tabs on day one, Then 1 tab daily for 4 days 04/01/20   Vivi Barrack, MD  diclofenac (VOLTAREN) 75 MG EC tablet Take 1 tablet (75 mg total) by mouth 2 (two) times daily as needed (with compazine for migraine). 03/14/20   Vivi Barrack, MD  DULoxetine (CYMBALTA) 60 MG capsule Take 1 capsule (60 mg total) by mouth daily. 07/09/16   Dohmeier, Asencion Partridge, MD  levofloxacin (LEVAQUIN) 500 MG tablet Take 1 tablet (500 mg total) by mouth daily. 03/19/20   Vivi Barrack, MD  loratadine (CLARITIN) 10 MG tablet TAKE 1 TABLET(10 MG) BY MOUTH DAILY Patient taking differently: Take 10 mg by mouth daily as needed for allergies. 06/29/16   Dohmeier, Asencion Partridge, MD  omeprazole (PRILOSEC) 40 MG capsule Take 1 capsule (40 mg total) by mouth daily. 12/27/19   Zehr, Laban Emperor, PA-C  ondansetron (ZOFRAN) 4 MG tablet Take 1 tablet (4 mg total) by mouth every 8 (eight) hours as needed for nausea or vomiting. 03/17/20   Rolland Porter, MD  pantoprazole (PROTONIX) 40 MG tablet Take 40 mg by mouth daily. 10/16/19   [provider]  prochlorperazine (COMPAZINE) 10 MG tablet Take 1 tablet (10 mg total) by mouth 2 (two) times daily as needed (migraine with diclofenac). 03/14/20   Vivi Barrack, MD  ranitidine (ZANTAC) 75 MG tablet  Take 150 mg by mouth at bedtime.    [provider]  sucralfate (CARAFATE) 1 g tablet Take 1 tablet (1 g total) by mouth 4 (four) times daily for 5 days. 02/07/20 02/12/20  Ladene Artist, MD    Allergies    Amoxicillin, Biaxin [clarithromycin], Ceftin [cefuroxime axetil], Penicillins, and Tetracyclines & related  Review of Systems   Review of Systems  Gastrointestinal:  Positive for abdominal pain.  All other systems reviewed and are negative.  Physical Exam Updated Vital Signs BP 126/78 (BP Location: Right Arm)   Pulse (!) 101   Temp 98.6 F (37 C)   Resp 16   SpO2 98%   Physical Exam Vitals and nursing note reviewed.  Constitutional:      General: He is not in acute distress.    Appearance: He is well-developed.  HENT:     Head: Normocephalic and atraumatic.  Eyes:     Conjunctiva/sclera: Conjunctivae normal.     Pupils: Pupils are equal, round, and reactive to light.  Cardiovascular:     Rate and Rhythm: Regular rhythm. Tachycardia present.     Heart sounds: No murmur heard. Pulmonary:     Effort: Pulmonary effort is normal. No respiratory distress.     Breath sounds: Normal breath sounds. No wheezing or rales.  Abdominal:     General: There is no distension.     Palpations: Abdomen is soft.     Tenderness: There is abdominal tenderness in the right lower quadrant and suprapubic area. There is guarding. There is no right CVA tenderness, left CVA tenderness or rebound.  Musculoskeletal:        General: No tenderness. Normal range of motion.     Cervical back: Normal range of motion and neck supple.  Skin:    General: Skin is warm and dry.     Findings: No erythema or rash.  Neurological:     Mental Status: He is alert and oriented to person, place, and time. Mental status is at baseline.  Psychiatric:        Mood and Affect: Mood normal.        Behavior: Behavior normal.    ED Results / Procedures / Treatments   Labs (all labs ordered are listed,  but only abnormal results are displayed) Labs Reviewed  CBC - Abnormal; Notable for the following components:      Result Value   WBC 11.2 (*)    MCHC 36.2 (*)    All other components within normal limits  LIPASE, BLOOD  COMPREHENSIVE METABOLIC PANEL  URINALYSIS, ROUTINE W REFLEX MICROSCOPIC    EKG None  Radiology CT ABDOMEN PELVIS W CONTRAST  Result Date: 11/03/2020  CLINICAL DATA:  Right lower quadrant abdominal pain EXAM: CT ABDOMEN AND PELVIS WITH CONTRAST TECHNIQUE: Multidetector CT imaging of the abdomen and pelvis was performed using the standard protocol following bolus administration of intravenous contrast. CONTRAST:  160m OMNIPAQUE IOHEXOL 300 MG/ML  SOLN COMPARISON:  None. FINDINGS: LOWER CHEST: Normal. HEPATOBILIARY: Normal hepatic contours. No intra- or extrahepatic biliary dilatation. Normal gallbladder. PANCREAS: Normal pancreas. No ductal dilatation or peripancreatic fluid collection. SPLEEN: Normal. ADRENALS/URINARY TRACT: The adrenal glands are normal. No hydronephrosis, nephroureterolithiasis or solid renal mass. The urinary bladder is normal for degree of distention STOMACH/BOWEL: There is no hiatal hernia. Normal duodenal course and caliber. No small bowel dilatation or inflammation. No focal colonic abnormality. Normal appendix. VASCULAR/LYMPHATIC: Normal course and caliber of the major abdominal vessels. No abdominal or pelvic lymphadenopathy. REPRODUCTIVE: Normal prostate size with symmetric seminal vesicles. MUSCULOSKELETAL. No bony spinal canal stenosis or focal osseous abnormality. OTHER: None. IMPRESSION: No acute abnormality of the abdomen or pelvis.  Normal appendix. Electronically Signed   By: KUlyses JarredM.D.   On: 11/03/2020 19:54    Procedures Procedures   Medications Ordered in ED Medications  lactated ringers bolus 1,000 mL (has no administration in time range)  ondansetron (ZOFRAN) injection 4 mg (has no administration in time range)    ED Course   I have reviewed the triage vital signs and the nursing notes.  Pertinent labs & imaging results that were available during my care of the patient were reviewed by me and considered in my medical decision making (see chart for details).    MDM Rules/Calculators/A&P                           Patient presenting today with significant lower abdominal pain most pronounced in the right lower quadrant.  Concern for recurrent epiploic appendagitis versus appendicitis versus renal stone versus diverticulitis.  Patient given IV fluids and nausea medication.  Mild leukocytosis of 11 today.  CMP, lipase and UA pending.  Repeat CT pending.  8:21 PM Labs are all within normal limits today.  CT without acute findings.  This was discussed with the patient.  He has had issues with IBS for a long period of time.  Question whether this is exacerbation of that.  Encouraged follow-up with GI.  Patient given Bentyl.  He denies any reason he would have injured himself doing his job that this would be musculoskeletal.  MDM   Amount and/or Complexity of Data Reviewed Clinical lab tests: ordered and reviewed Tests in the radiology section of CPT: ordered and reviewed Independent visualization of images, tracings, or specimens: yes     Final Clinical Impression(s) / ED Diagnoses Final diagnoses:  Lower abdominal pain    Rx / DC Orders ED Discharge Orders          Ordered    dicyclomine (BENTYL) 20 MG tablet  2 times daily        11/03/20 2023             PBlanchie Dessert MD 11/03/20 2024

## 2020-11-03 NOTE — ED Notes (Signed)
Patient transported to CT 

## 2020-11-03 NOTE — Discharge Instructions (Addendum)
The blood work and CAT scans today were normal.  This could be a flare of your IBS.  You can try to take the antispasm medication you are prescribed but it will be very important for you to follow-up with the gastroenterologist.

## 2020-11-03 NOTE — ED Triage Notes (Signed)
Patient repots x3 days of abdominal pain. Patient reports he was sent to r/o appendicitis by his PCP

## 2021-01-09 IMAGING — CT CT NECK W/ CM
5 series · 16 of 33 positions shown, 18 images · IV contrast (omnipaque)
Comparison: None.

CLINICAL DATA: Headache and cervical lymphadenopathy.

EXAM:
CT NECK WITH CONTRAST
TECHNIQUE: Multidetector CT imaging of the neck was performed using the
standard protocol following the bolus administration of intravenous
contrast.
CONTRAST:  75mL OMNIPAQUE IOHEXOL 300 MG/ML  SOLN

[Series 4: axial neck · axial · 0.51mm/px · z∈[-309,-225]mm · 2 of 126 slices shown]
[im 42/126  bone]
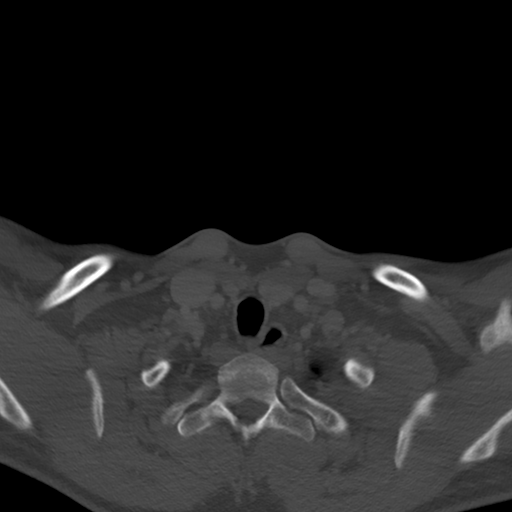
[im 84/126  bone]
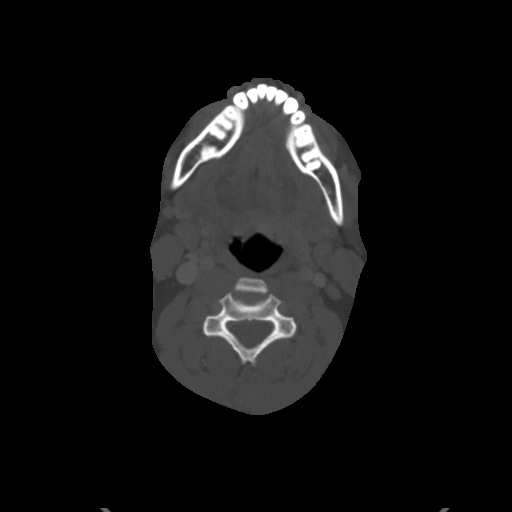

[Series 6: axial bone · axial · 0.51mm/px · z∈[-329,-205]mm · 3 of 126 slices shown]
[im 32/126  bone]
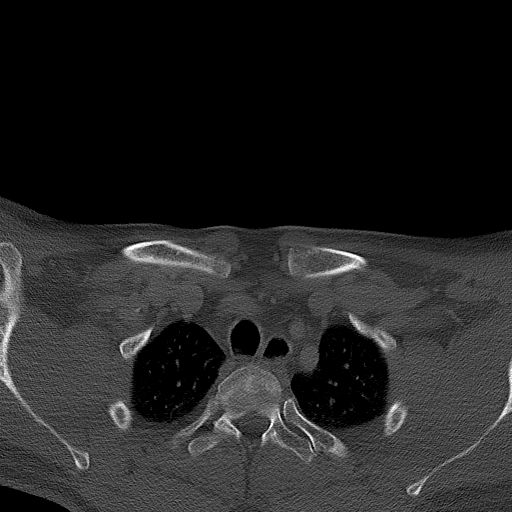
[im 63/126  bone]
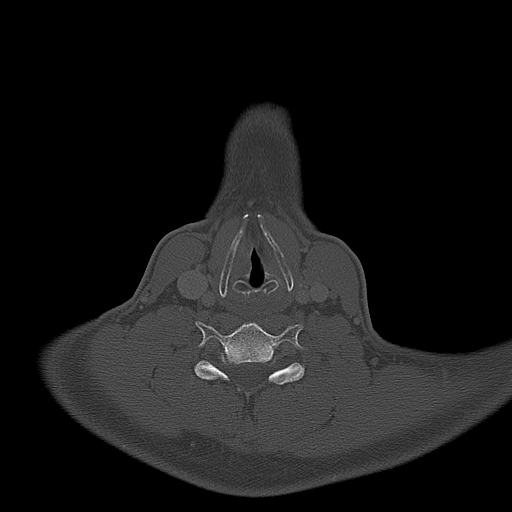
[im 94/126  bone]
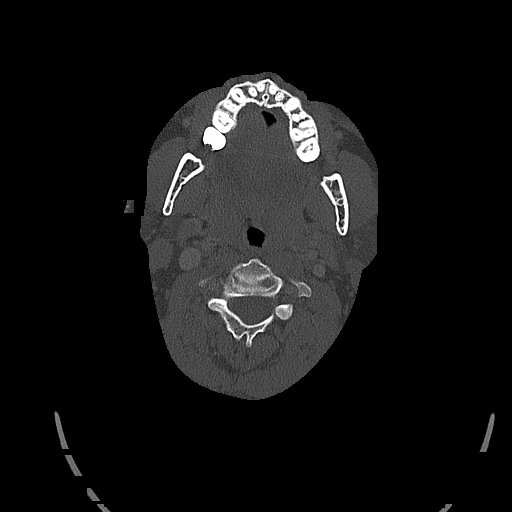

[Series 7: orthogonal (person_name) · axial · 0.39mm/px · z∈[-357,-221]mm · 3 of 139 slices shown, 4 images]
[im 35/139  soft-tissue]
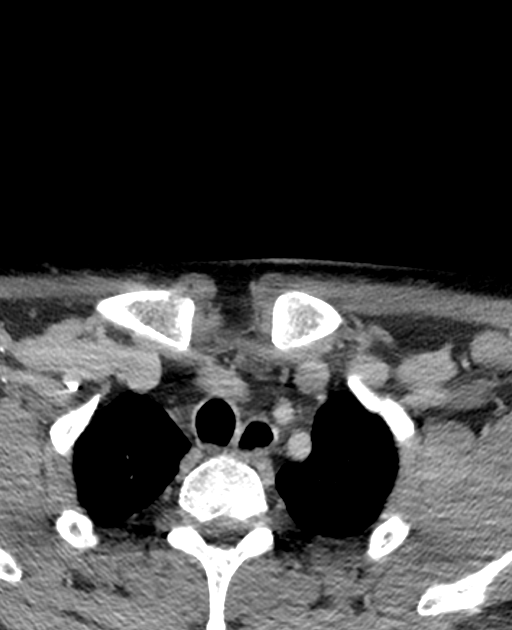
[im 35/139  bone]
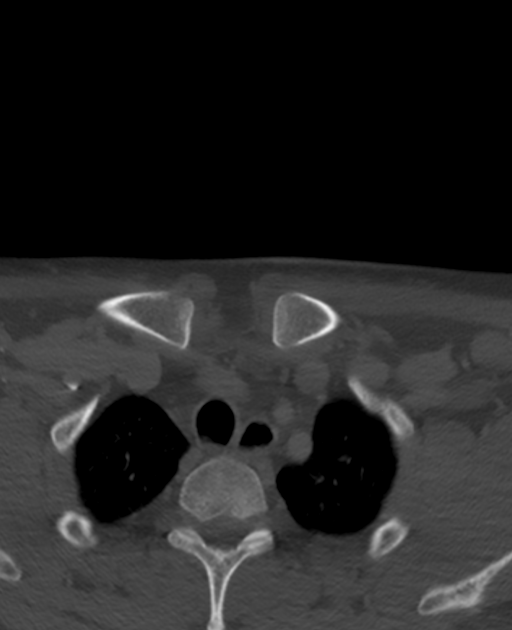
[im 70/139  bone]
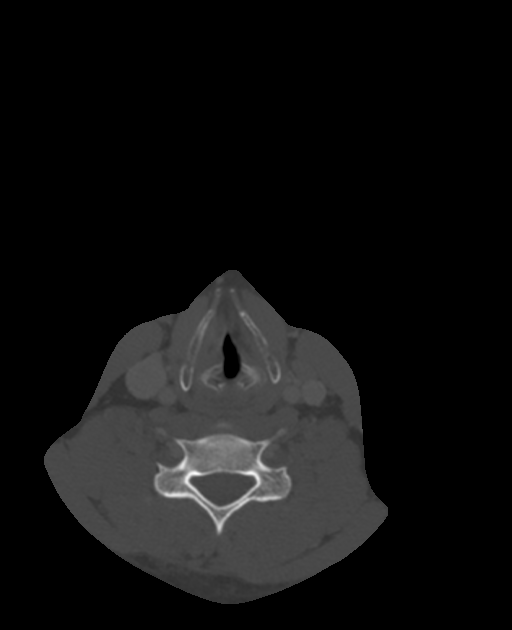
[im 104/139  bone]
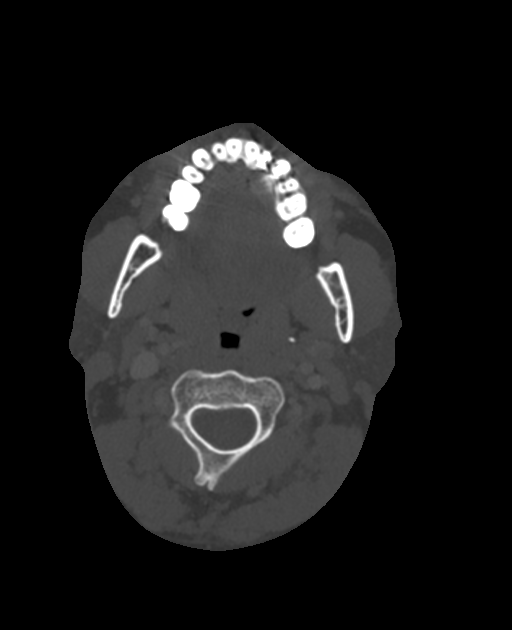

[Series 8: cor neck · coronal · 0.50mm/px · 3 of 115 slices shown]
[im 23/115  bone]
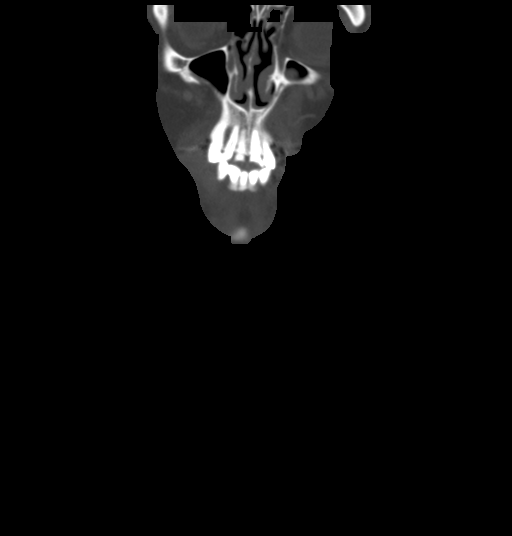
[im 46/115  bone]
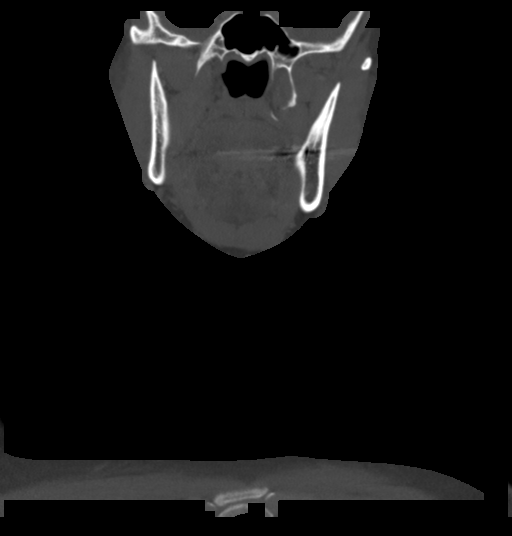
[im 69/115  bone]
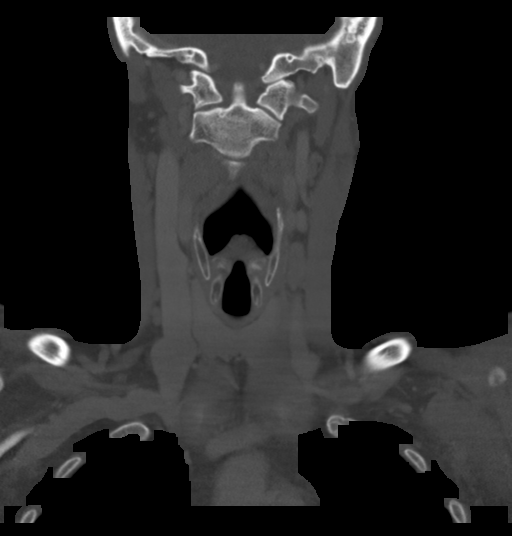

[Series 9: sag neck · sagittal · 0.50mm/px · 5 of 101 slices shown, 6 images]
[im 34/101  bone]
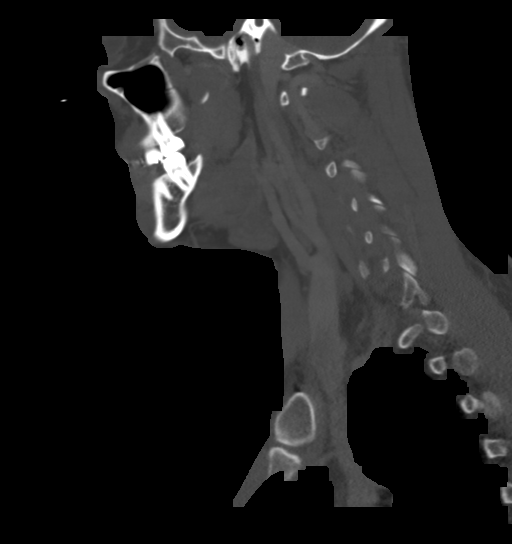
[im 42/101  bone]
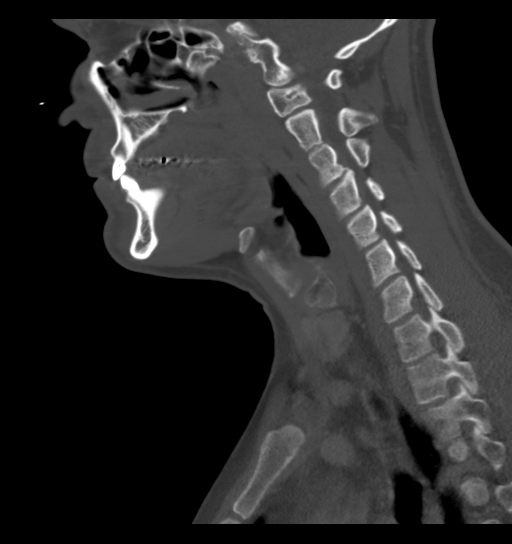
[im 51/101  soft-tissue]
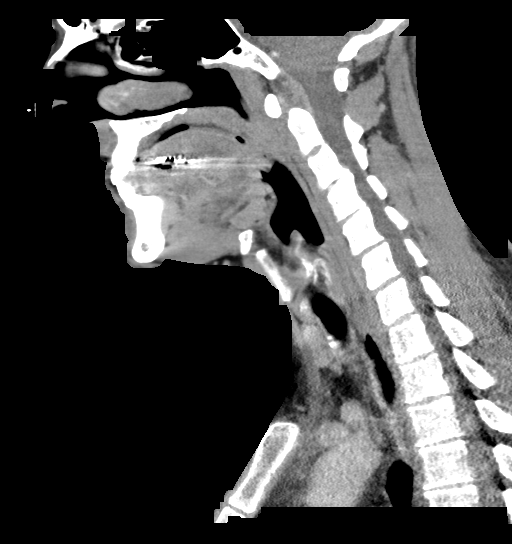
[im 51/101  bone]
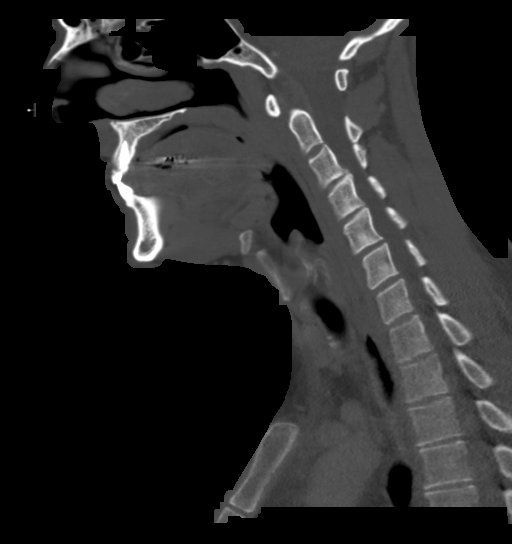
[im 59/101  bone]
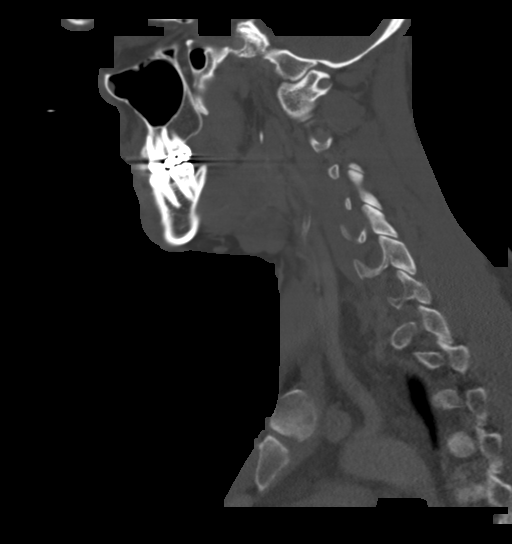
[im 67/101  bone]
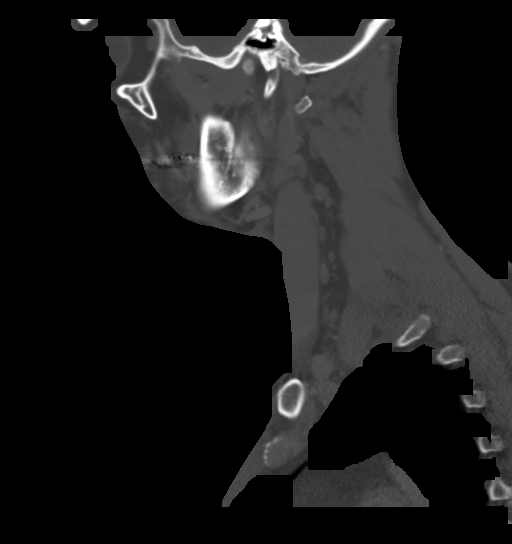

[16 of 33 positions shown; findings below may reference images not displayed]

FINDINGS: PHARYNX AND LARYNX: The nasopharynx, oropharynx and larynx are
normal. Visible portions of the oral cavity, tongue base and floor
of mouth are normal. Normal epiglottis, vallecula and pyriform
sinuses. The larynx is normal. No retropharyngeal abscess, effusion
or lymphadenopathy.

SALIVARY GLANDS: Normal parotid, submandibular and sublingual
glands.

THYROID: Normal.

LYMPH NODES: No enlarged or abnormal density lymph nodes.

VASCULAR: Major cervical vessels are patent.

LIMITED INTRACRANIAL: Normal.

VISUALIZED ORBITS: Normal.

MASTOIDS AND VISUALIZED PARANASAL SINUSES: No fluid levels or
advanced mucosal thickening. No mastoid effusion.

SKELETON: No bony spinal canal stenosis. No lytic or blastic
lesions.

UPPER CHEST: Clear.

OTHER: None.
IMPRESSION: Normal CT of the neck. No enlarged or abnormal density cervical
lymph nodes.

## 2021-01-29 ENCOUNTER — Ambulatory Visit
Admission: RE | Admit: 2021-01-29 | Discharge: 2021-01-29 | Disposition: A | Discharge status: Home / Self Care | Payer: 59 | Source: Ambulatory Visit | Attending: Family Medicine | Admitting: Family Medicine

## 2021-01-29 ENCOUNTER — Other Ambulatory Visit: Payer: Self-pay | Admitting: Family Medicine

## 2021-01-29 DIAGNOSIS — M25512 Pain in left shoulder: Secondary | ICD-10-CM

## 2021-01-29 DIAGNOSIS — M542 Cervicalgia: Secondary | ICD-10-CM

## 2021-04-29 ENCOUNTER — Ambulatory Visit
Admission: RE | Admit: 2021-04-29 | Discharge: 2021-04-29 | Disposition: A | Discharge status: Home / Self Care | Payer: 59 | Source: Ambulatory Visit | Attending: Physician Assistant | Admitting: Physician Assistant

## 2021-04-29 ENCOUNTER — Other Ambulatory Visit: Payer: Self-pay

## 2021-04-29 ENCOUNTER — Other Ambulatory Visit: Payer: Self-pay | Admitting: Physician Assistant

## 2021-04-29 DIAGNOSIS — R053 Chronic cough: Secondary | ICD-10-CM

## 2021-05-12 ENCOUNTER — Other Ambulatory Visit: Payer: Self-pay | Admitting: Physician Assistant

## 2021-05-12 DIAGNOSIS — M542 Cervicalgia: Secondary | ICD-10-CM

## 2021-05-22 ENCOUNTER — Other Ambulatory Visit: Payer: Self-pay | Admitting: Family Medicine

## 2021-05-22 ENCOUNTER — Other Ambulatory Visit (HOSPITAL_COMMUNITY): Payer: Self-pay | Admitting: Family Medicine

## 2021-05-22 DIAGNOSIS — M542 Cervicalgia: Secondary | ICD-10-CM

## 2021-05-26 ENCOUNTER — Other Ambulatory Visit: Payer: Self-pay

## 2021-05-26 ENCOUNTER — Ambulatory Visit (HOSPITAL_COMMUNITY)
Admission: RE | Admit: 2021-05-26 | Discharge: 2021-05-26 | Disposition: A | Discharge status: Home / Self Care | Payer: 59 | Source: Ambulatory Visit | Attending: Family Medicine | Admitting: Family Medicine

## 2021-05-26 DIAGNOSIS — M542 Cervicalgia: Secondary | ICD-10-CM | POA: Diagnosis not present

## 2021-05-26 MED ORDER — SODIUM CHLORIDE (PF) 0.9 % IJ SOLN
INTRAMUSCULAR | Status: AC
  Filled 2021-05-26: qty 50

## 2021-06-12 ENCOUNTER — Other Ambulatory Visit: Payer: Self-pay

## 2021-06-12 ENCOUNTER — Ambulatory Visit (INDEPENDENT_AMBULATORY_CARE_PROVIDER_SITE_OTHER): Payer: 59 | Admitting: Internal Medicine

## 2021-06-12 ENCOUNTER — Encounter: Payer: Self-pay | Admitting: Internal Medicine

## 2021-06-12 VITALS — BP 118/76 | HR 83 | Temp 97.8°F | Ht 71.0 in | Wt 189.0 lb

## 2021-06-12 DIAGNOSIS — J302 Other seasonal allergic rhinitis: Secondary | ICD-10-CM | POA: Diagnosis not present

## 2021-06-12 DIAGNOSIS — J45909 Unspecified asthma, uncomplicated: Secondary | ICD-10-CM | POA: Diagnosis not present

## 2021-06-12 DIAGNOSIS — J4540 Moderate persistent asthma, uncomplicated: Secondary | ICD-10-CM

## 2021-06-12 LAB — NITRIC OXIDE: Nitric Oxide: 25

## 2021-06-12 MED ORDER — MONTELUKAST SODIUM 10 MG PO TABS: 10.0000 mg | ORAL_TABLET | Freq: Every day | ORAL | 11 refills | Status: DC

## 2021-06-12 NOTE — Progress Notes (Signed)
? ?      ?Dustin [name]    [id number]    1990-07-25 ? ?Primary Care Physician:Swayne, Shanon Brow, MD ? ?Referring Physician: Antony Contras, MD ?[address] ?[address] ?[place],  [place] [address] ?Reason for Consultation: asthma ?Date of Consultation: 06/12/2021 ? ?Chief complaint:   ?Chief Complaint  ?Patient presents with  ? Consult  ?  Pt is being referred due to a history of asthma. States he has to use his rescue inhaler at least twice a day every day. Pt states he does have complaints of SOB. States that he does have a chronic cough. Also states the left side of his neck down to his chest feels swollen.  ?  ? ?HPI: ?Dustin [name] is a 31 y.o. man who presents for new patient evaluation of asthma. Additional history of migraines, GERD, IBS, POTS and vaping.  ? ?Diagnosed with asthma in childhood. Was mild and treated with prn albuterol which he almost never took. He ran cross country in high school. No limitations in activity and usually didn't take his inhaler before any activities.  ? ?He notes worsening cough and wheezing relieved with albuterol inhaler. Symptoms started a few years ago, wheezing worsening over the last 6 months.  ? ?Was started on adair inhaler which he is taking once a day - started by PCP about a month ago. Has not noticed any improvement in symptoms since starting advair.  ? ? ?Current Regimen: albuterol prn - taking this 2 puffs twice a day.  ?Asthma Triggers: exertion, smoke ?Exacerbations in the last year: none ?History of hospitalization or intubation: none ?Allergy Testing: yes had done at [facility] - says was not allergic to too much.  ?GERD:  yes, daily omeprazole ?Allergic Rhinitis: yes, has worsening allergies. Takes claritin but has ongoing scratchy throat, astelin.  ?ACT:  ?Asthma Control Test ACT Total Score  ?06/12/2021 11  ? ?FeNO: 25 ppb ? ? ?Social history: ? ?Occupation: he is a Press photographer. He is able to complete all his ADLs.   ?Exposures: lives at home with wife and 2 dogs. He is allergic to cats.  ?Smoking history: former smoker - quit 2-3 months ago. Has passive smoke exposure in childhood.  ?Still uses a vape pen and goes through one a day. Of 45m solution.  ? ? ?Social History  ? ?Occupational History  ? Not on file  ?Tobacco Use  ? Smoking status: Former  ?  Packs/day: 0.50  ?  Years: 13.00  ?  Pack years: 6.50  ?  Types: Cigarettes  ?  Start date: 2008  ?  Quit date: 04/2021  ?  Years since quitting: 0.1  ? Smokeless tobacco: Never  ? Tobacco comments:  ?  Still vapes occasionally  ?Vaping Use  ? Vaping Use: Every day  ? Substances: Nicotine, Flavoring  ?Substance and Sexual Activity  ? Alcohol use: Not Currently  ?  Alcohol/week: 0.0 standard drinks  ? Drug use: No  ? Sexual activity: Yes  ?  Partners: Female  ? ? ?Relevant family history: ? ?Family History  ?Problem Relation Age of Onset  ? Migraines Mother   ? Esophageal cancer Father 435 ? Lung cancer Paternal Grandfather   ? Asthma Neg Hx   ? ? ?Past Medical History:  ?Diagnosis Date  ? [facility]ism in [facility] ([facility]   ? Asthma   ? Bilateral headaches 01/27/2015  ? Headache   ? Hearing loss   ? IBS (  irritable bowel syndrome)   ? POTS (postural orthostatic tachycardia syndrome) 02/13/2016  ? Vertigo   ? ? ?Past Surgical History:  ?Procedure Laterality Date  ? INGUINAL HERNIA REPAIR    ? SINUS ENDO W/FUSION    ? WISDOM TOOTH EXTRACTION    ? ? ? ?Physical Exam: ?Blood pressure 118/76, pulse 83, temperature 97.8 ?F (36.6 ?C), temperature source Oral, height '5\' 11"'$  (1.803 m), weight 189 lb (85.7 kg), SpO2 97 %. ?Gen:      No acute distress ?ENT:  mild nasal debris, +cobblestoning, no nasal polyps, mucus membranes moist ?Lungs:    No increased respiratory effort, symmetric chest wall excursion, clear to auscultation bilaterally, no wheezes or crackles ?CV:         Regular rate and rhythm; no murmurs, rubs, or gallops.  No pedal edema ?Abd:      + bowel sounds; soft, non-tender; no  distension ?MSK: no acute synovitis of DIP or PIP joints, no mechanics hands.  ?Skin:      Warm and dry; no rashes ?Neuro: normal speech, no focal facial asymmetry ?Psych: alert and oriented x3, normal mood and affect ? ? ?Data Reviewed/Medical Decision Making: ? ?Independent interpretation of tests: ?Imaging: ? Review of patient's chest xray Jan 2023 images revealed no acute cardiopulmonary process. The patient's images have been independently reviewed by me.   ? ?PFTs: ?I have personally reviewed the patient's PFTs spiro March 10 2023and no airflow limitation ?No flowsheet data found. ? ?Labs:  ?Lab Results  ?Component Value Date  ? WBC 11.2 (H) 11/03/2020  ? HGB 15.4 11/03/2020  ? HCT 42.5 11/03/2020  ? MCV 89.3 11/03/2020  ? PLT 273 11/03/2020  ? ?AEC in Sept 2021 - 400 ? ? ?Immunization status:  ?Immunization History  ?Administered Date(s) Administered  ? Hepatitis B, ped/adol 12/23/1999, 01/29/2000, 06/16/2000  ? Influenza,inj,Quad PF,6+ Mos 11/29/2016, 02/26/2019  ? Meningococcal Conjugate 09/28/2006  ? Tdap 07/13/2006, 03/21/2017  ? ? ? I reviewed prior external note(s) from ENT, hospital stay, family medicine ? I reviewed the result(s) of the labs and imaging as noted above.  ? I have ordered spirometry ? ?Assessment:  ?Moderate persistent asthma ?Allergic rhinitis ?GERD not well controlled ? ?Plan/Recommendations: ?Increased advair to twice a day. Continue albuterol ?Continue claritin and add singulair. Continue astelin ?Continue acid reflux medicine ppi.  ? ?Quit vaping. ?Spiro/feno obtained today.  ? ?We discussed disease management and progression at length today.  ? ? ?Return to Care: ?Return in about 6 weeks (around 07/24/2021). ? ?Lenice Llamas, MD ?Pulmonary and Critical Care Medicine ?[place] ?Office:[phone] ? ?CC: Antony Contras, MD ? ? ? ?

## 2021-06-12 NOTE — Progress Notes (Signed)
The patient has been prescribed the inhaler advair. Inhaler technique was demonstrated to patient. The patient subsequently demonstrated correct technique.  

## 2021-06-12 NOTE — Patient Instructions (Addendum)
Please schedule follow up scheduled with myself in 6 weeks.  If my schedule is not open yet, we will contact you with a reminder closer to that time. Please call [phone] if you haven't heard from [place] a month before.   Increase your advair to 1 puff twice a day. Gargle after use.  Continue astelin nasal spray with claritin. I am adding another allergy/asthma medication called montelukast which will also help.   Continue albuterol as needed.   Quit vaping!  Astelin - 1 spray on each side of your nose twice a day for first week, then 1 spray on each side.   Instructions for use: If you also use a saline nasal spray or rinse, use that first. Position the head with the chin slightly tucked. Use the right hand to spray into the left nostril and the right hand to spray into the left nostril.   Point the bottle away from the septum of your nose (cartilage that divides the two sides of your nose).  Hold the nostril closed on the opposite side from where you will spray Spray once and gently sniff to pull the medicine into the higher parts of your nose.  Don't sniff too hard as the medicine will drain down the back of your throat instead. Repeat with a second spray on the same side if prescribed. Repeat on the other side of your nose.  By learning about asthma and how it can be controlled, you take an important step toward managing this disease. Work closely with your asthma care team to learn all you can about your asthma, how to avoid triggers, what your medications do, and how to take them correctly. With proper care, you can live free of asthma symptoms and maintain a normal, healthy lifestyle.   What is asthma? Asthma is a chronic disease that affects the airways of the lungs. During normal breathing, the bands of muscle that surround the airways are relaxed and air moves freely. During an asthma episode or "attack," there are three main changes that stop air from moving easily through the  airways: The bands of muscle that surround the airways tighten and make the airways narrow. This tightening is called bronchospasm.  The lining of the airways becomes swollen or inflamed.  The cells that line the airways produce more mucus, which is thicker than normal and clogs the airways.  These three factors - bronchospasm, inflammation, and mucus production - cause symptoms such as difficulty breathing, wheezing, and coughing.  What are the most common symptoms of asthma? Asthma symptoms are not the same for everyone. They can even change from episode to episode in the same person. Also, you may have only one symptom of asthma, such as cough, but another person may have all the symptoms of asthma. It is important to know all the symptoms of asthma and to be aware that your asthma can present in any of these ways at any time. The most common symptoms include: Coughing, especially at night  Shortness of breath  Wheezing  Chest tightness, pain, or pressure   Who is affected by asthma? Asthma affects 22 million Americans; about 6 million of these are children under age 25. People who have a family history of asthma have an increased risk of developing the disease. Asthma is also more common in people who have allergies or who are exposed to tobacco smoke. However, anyone can develop asthma at any time. Some people may have asthma all of their lives,  while others may develop it as adults.  What causes asthma? The airways in a person with asthma are very sensitive and react to many things, or "triggers." Contact with these triggers causes asthma symptoms. One of the most important parts of asthma control is to identify your triggers and then avoid them when possible. The only trigger you do not want to avoid is exercise. Pre-treatment with medicines before exercise can allow you to stay active yet avoid asthma symptoms. Common asthma triggers include: Infections (colds, viruses, flu, sinus  infections)  Exercise  Weather (changes in temperature and/or humidity, cold air)  Tobacco smoke  Allergens (dust mites, pollens, pets, mold spores, cockroaches, and sometimes foods)  Irritants (strong odors from cleaning products, perfume, wood smoke, air pollution)  Strong emotions such as crying or laughing hard  Some medications   How is asthma diagnosed? To diagnose asthma, your doctor will first review your medical history, family history, and symptoms. Your doctor will want to know any past history of breathing problems you may have had, as well as a family history of asthma, allergies, eczema (a bumpy, itchy skin rash caused by allergies), or other lung disease. It is important that you describe your symptoms in detail (cough, wheeze, shortness of breath, chest tightness), including when and how often they occur. The doctor will perform a physical examination and listen to your heart and lungs. He or she may also order breathing tests, allergy tests, blood tests, and chest and sinus X-rays. The tests will find out if you do have asthma and if there are any other conditions that are contributing factors.  How is asthma treated? Asthma can be controlled, but not cured. It is not normal to have frequent symptoms, trouble sleeping, or trouble completing tasks. Appropriate asthma care will prevent symptoms and visits to the emergency room and hospital. Asthma medicines are one of the mainstays of asthma treatment. The drugs used to treat asthma are explained below.  Anti-inflammatories: These are the most important drugs for most people with asthma. Anti-inflammatory drugs reduce swelling and mucus production in the airways. As a result, airways are less sensitive and less likely to react to triggers. These medications need to be taken daily and may need to be taken for several weeks before they begin to control asthma. Anti-inflammatory medicines lead to fewer symptoms, better airflow, less  sensitive airways, less airway damage, and fewer asthma attacks. If taken every day, they CONTROL or prevent asthma symptoms.   Bronchodilators: These drugs relax the muscle bands that tighten around the airways. This action opens the airways, letting more air in and out of the lungs and improving breathing. Bronchodilators also help clear mucus from the lungs. As the airways open, the mucus moves more freely and can be coughed out more easily. In short-acting forms, bronchodilators RELIEVE or stop asthma symptoms by quickly opening the airways and are very helpful during an asthma episode. In long-acting forms, bronchodilators provide CONTROL of asthma symptoms and prevent asthma episodes.  Asthma drugs can be taken in a variety of ways. Inhaling the medications by using a metered dose inhaler, dry powder inhaler, or nebulizer is one way of taking asthma medicines. Oral medicines (pills or liquids you swallow) may also be prescribed.  Asthma severity Asthma is classified as either "intermittent" (comes and goes) or "persistent" (lasting). Persistent asthma is further described as being mild, moderate, or severe. The severity of asthma is based on how often you have symptoms both during the day and  night, as well as by the results of lung function tests and by how well you can perform activities. The "severity" of asthma refers to how "intense" or "strong" your asthma is.  Asthma control Asthma control is the goal of asthma treatment. Regardless of your asthma severity, it may or may not be controlled. Asthma control means: You are able to do everything you want to do at work and home  You have no (or minimal) asthma symptoms  You do not wake up from your sleep or earlier than usual in the morning due to asthma  You rarely need to use your reliever medicine (inhaler)  Another major part of your treatment is that you are happy with your asthma care and believe your asthma is controlled.  Monitoring  symptoms A key part of treatment is keeping track of how well your lungs are working. Monitoring your symptoms  what they are, how and when they happen, and how severe they are  is an important part of being able to control your asthma.  Sometimes asthma is monitored using a peak flow meter. A peak flow (PF) meter measures how fast the air comes out of your lungs. It can help you know when your asthma is getting worse, sometimes even before you have symptoms. By taking daily peak flow readings, you can learn when to adjust medications to keep asthma under good control. It is also used to create your asthma action plan (see below). Your doctor can use your peak flow readings to adjust your treatment plan in some cases.  Asthma Action Plan Based on your history and asthma severity, you and your doctor will develop a care plan called an asthma action plan. The asthma action plan describes when and how to use your medicines, actions to take when asthma worsens, and when to seek emergency care. Make sure you understand this plan. If you do not, ask your asthma care provider any questions you may have. Your asthma action plan is one of the keys to controlling asthma. Keep it readily available to remind you of what you need to do every day to control asthma and what you need to do when symptoms occur.  Goals of asthma therapy These are the goals of asthma treatment: Live an active, normal life  Prevent chronic and troublesome symptoms  Attend work or school every day  Perform daily activities without difficulty  Stop urgent visits to the doctor, emergency department, or hospital  Use and adjust medications to control asthma with few or no side effects

## 2021-07-28 ENCOUNTER — Ambulatory Visit: Payer: 59 | Admitting: Internal Medicine

## 2021-10-21 ENCOUNTER — Ambulatory Visit: Payer: Self-pay | Admitting: Surgery

## 2021-10-21 NOTE — H&P (Signed)
Dustin [name] [id number]   Referring Provider:  Arlyss Repress   Subjective   Chief Complaint: New Consultation     History of Present Illness:    31 year old with history of alcoholism in recovery, asthma, headaches, irritable bowel syndrome, POTS, vertigo, previous inguinal hernia repair who is referred for evaluation of a mass in the torso.  This has been present for several years.  Previously was not symptomatic.  It has become more tender and painful recently, and he thinks it has increased in size.  He is interested in having it removed.    Review of Systems: A complete review of systems was obtained from the patient.  I have reviewed this information and discussed as appropriate with the patient.  See HPI as well for other ROS.   Medical History: Past Medical History:  Diagnosis Date   Anxiety    Asthma, unspecified asthma severity, unspecified whether complicated, unspecified whether persistent    GERD (gastroesophageal reflux disease)     There is no problem list on file for this patient.   Past Surgical History:  Procedure Laterality Date   biligual hernia repair  2004   FUNCTIONAL ENDOSCOPIC SINUS SURGERY  2016     Allergies  Allergen Reactions   Penicillins Hives and Other (See Comments)    Has patient had a PCN reaction causing immediate rash, facial/tongue/throat swelling, SOB or lightheadedness with hypotension: Yes  Has patient had a PCN reaction causing severe rash involving mucus membranes or skin necrosis: Yes  Has patient had a PCN reaction that required hospitalization: No  Has patient had a PCN reaction occurring within the last 10 years: No  If all of the above answers are "NO", then may proceed with Cephalosporin use.   Tetracyclines Hives and Other (See Comments)   Amoxicillin (Bulk) Vomiting    Current Outpatient Medications on File Prior to Visit  Medication Sig Dispense Refill   montelukast (SINGULAIR) 10 mg tablet  Take 10 mg by mouth at bedtime     omeprazole (PRILOSEC) 40 MG DR capsule 1 capsule     DULoxetine (CYMBALTA) 60 MG DR capsule 1 capsule     propranoloL (INDERAL) 10 MG tablet 1 tablet     No current facility-administered medications on file prior to visit.    History reviewed. No pertinent family history.   Social History   Tobacco Use  Smoking Status Every Day   Packs/day: 0.50   Types: Cigarettes  Smokeless Tobacco Not on file     Social History   Socioeconomic History   Marital status: Single  Tobacco Use   Smoking status: Every Day    Packs/day: 0.50    Types: Cigarettes  Substance and Sexual Activity   Alcohol use: Yes   Drug use: Never    Objective:    Vitals:   10/21/21 1557  BP: (!) 140/80  Pulse: 82  Temp: 36.4 C (97.6 F)  SpO2: 98%  Weight: 83.2 kg (183 lb 6.4 oz)  Height: 180.3 cm (_0 )    Body mass index is 25.58 kg/m.  Gen alert and well-appearing Unlabored respirations Abdomen soft, nontender.  In the right subcostal margin there is a smooth mobile subcutaneous mass approximately 2 cm in maximal diameter about 2 cm below the edge of the rib.  This is focally tender.  Does not radiate  Assessment and Plan:  Diagnoses and all orders for this visit:  Subcutaneous mass     Discussed excision under MAC.  Risks of bleeding, infection, pain, scarring, injury to surrounding structures, ongoing pain or tenderness in the area, lesion recurrence, wound healing problems/seroma/hematoma.  Questions welcomed and answered.  We will schedule at his convenience.   Derrik Mceachern Raquel James, MD

## 2021-12-25 ENCOUNTER — Ambulatory Visit
Admission: RE | Admit: 2021-12-25 | Discharge: 2021-12-25 | Disposition: A | Discharge status: Home / Self Care | Payer: 59 | Source: Ambulatory Visit | Attending: Physician Assistant | Admitting: Physician Assistant

## 2021-12-25 ENCOUNTER — Other Ambulatory Visit: Payer: Self-pay | Admitting: Physician Assistant

## 2021-12-25 DIAGNOSIS — R110 Nausea: Secondary | ICD-10-CM

## 2021-12-25 DIAGNOSIS — K5900 Constipation, unspecified: Secondary | ICD-10-CM | POA: Diagnosis not present

## 2021-12-25 DIAGNOSIS — R1012 Left upper quadrant pain: Secondary | ICD-10-CM | POA: Diagnosis not present

## 2022-02-03 DIAGNOSIS — M791 Myalgia, unspecified site: Secondary | ICD-10-CM | POA: Diagnosis not present

## 2022-02-03 DIAGNOSIS — Z6825 Body mass index (BMI) 25.0-25.9, adult: Secondary | ICD-10-CM | POA: Diagnosis not present

## 2022-02-03 DIAGNOSIS — R0981 Nasal congestion: Secondary | ICD-10-CM | POA: Diagnosis not present

## 2022-02-03 DIAGNOSIS — H66002 Acute suppurative otitis media without spontaneous rupture of ear drum, left ear: Secondary | ICD-10-CM | POA: Diagnosis not present

## 2022-02-03 DIAGNOSIS — J069 Acute upper respiratory infection, unspecified: Secondary | ICD-10-CM | POA: Diagnosis not present

## 2022-02-03 DIAGNOSIS — R5383 Other fatigue: Secondary | ICD-10-CM | POA: Diagnosis not present

## 2022-02-03 DIAGNOSIS — J3489 Other specified disorders of nose and nasal sinuses: Secondary | ICD-10-CM | POA: Diagnosis not present

## 2022-03-18 DIAGNOSIS — R197 Diarrhea, unspecified: Secondary | ICD-10-CM | POA: Diagnosis not present

## 2022-04-22 ENCOUNTER — Other Ambulatory Visit: Payer: Self-pay

## 2022-04-25 NOTE — Progress Notes (Signed)
Office Visit Note  Patient: Dustin [name]             Date of Birth: 03/02/1991           MRN: [id number]             PCP: Tammi Sou, MD Referring: Traci Sermon, [place]* Visit Date: 05/07/2022 Occupation: '@GUAROCC'$ @  Subjective:  Generalized pain  History of Present Illness: Dustin [name] is a 32 y.o. male consultation per request of his PCP.  According the patient his symptoms started in May 2023.  He states he suddenly woke up 1 day with generalized achiness and pain.  He describes pain and discomfort in his cervical spine which radiates to the left side.  He has pain in his bilateral trapezius region.  He has discomfort in his hands which goes up to his elbows.  He complains of discomfort in his hip area which she just points to the trochanteric region.  He complains of discomfort in his bilateral knee joints in his shins.  He has not seen any joint swelling.  There is no history of any rash or psoriasis.  There is no history of Achilles tendinitis of Planter fasciitis.  He has chronic diarrhea due to IBS.  He had colonoscopy per patient which was negative.  There is no history of oral ulcers, nasal ulcers, malar rash, photosensitivity, Raynaud's phenomenon or lymphadenopathy.  He gives history of chronic insomnia since he was a child.  He denies significant fatigue.  He was diagnosed with POTS at age 71.  There is no family history of autoimmune disease.  He states his mother also aches all over and has been diagnosed with osteoarthritis.  He has 1 child was in good health and his siblings are in good health.    Activities of Daily Living:  Patient reports morning stiffness denies  .   Patient Reports nocturnal pain.  Difficulty dressing/grooming: Denies Difficulty climbing stairs: Reports Difficulty getting out of chair: Denies Difficulty using hands for taps, buttons, cutlery, and/or writing: Reports  Review of Systems  Constitutional:  Negative for fatigue.  HENT:   Negative for mouth sores and mouth dryness.   Eyes:  Negative for dryness.  Respiratory:  Negative for shortness of breath.   Cardiovascular:  Negative for chest pain and palpitations.  Gastrointestinal:  Positive for diarrhea. Negative for blood in stool and constipation.  Endocrine: Positive for increased urination.  Genitourinary:  Negative for involuntary urination.  Musculoskeletal:  Positive for joint pain, joint pain, myalgias, muscle weakness, muscle tenderness and myalgias. Negative for gait problem, joint swelling and morning stiffness.  Skin:  Negative for color change, hair loss and sensitivity to sunlight.  Allergic/Immunologic: Negative for susceptible to infections.  Neurological:  Positive for headaches. Negative for dizziness.  Hematological:  Positive for swollen glands.  Psychiatric/Behavioral:  Positive for sleep disturbance. Negative for depressed mood. The patient is nervous/anxious.     PMFS History:  Patient Active Problem List   Diagnosis Date Noted   Gastroesophageal reflux disease 12/27/2019   Dysphagia 12/27/2019   Generalized abdominal pain 12/27/2019   Pyrosis 12/27/2019   Diarrhea 02/26/2019   Irritability 12/05/2018   Allergic rhinitis 12/05/2018   POTS (postural orthostatic tachycardia syndrome) 02/13/2016   Epistaxis 10/28/2015   Bilateral headaches 01/27/2015    Past Medical History:  Diagnosis Date   Alcoholism in recovery [facility])    Asthma    Bilateral headaches 01/27/2015   neuro->low serotonin level->cymbalta  Epiploic appendagitis    09/2020   GERD (gastroesophageal reflux disease)    hx esophagitis   Hearing loss    IBS (irritable bowel syndrome)    POTS (postural orthostatic tachycardia syndrome) 02/13/2016   Restless legs syndrome    Seasonal allergies    Vertigo     Family History  Problem Relation Age of Onset   Migraines Mother    Arthritis Mother    Asthma Mother    Depression Mother    Hypertension Mother    Esophageal  cancer Father 30   Early death Father    Arthritis Maternal Grandmother    Cancer Maternal Grandmother    Lung cancer Paternal Grandfather    Past Surgical History:  Procedure Laterality Date   CARDIOVASCULAR STRESS TEST     12/2015 myoview normal   ESOPHAGOGASTRODUODENOSCOPY     02/06/2020: Esophagitis, erosive gastro duodenopathy   INGUINAL HERNIA REPAIR     bilat as a teen   SINUS ENDO W/FUSION     60s   WISDOM TOOTH EXTRACTION     Social History   Social History Narrative   Patient lives at home with his wife Beverlee Nims).   Educ: HS   Occup: truck driver   +tob   Hx alc abuse   Immunization History  Administered Date(s) Administered   Hepatitis B, PED/ADOLESCENT 12/23/1999, 01/29/2000, 06/16/2000   Influenza,inj,Quad PF,6+ Mos 11/29/2016, 02/26/2019   Influenza-Unspecified 02/01/2022   Meningococcal Conjugate 09/28/2006   Tdap 07/13/2006, 03/21/2017     Objective: Vital Signs: BP 119/83 (BP Location: Right Arm, Patient Position: Sitting, Cuff Size: Normal)   Pulse 82   Ht 5' 10.5" (1.791 m)   Wt 179 lb (81.2 kg)   BMI 25.32 kg/m    Physical Exam Vitals and nursing note reviewed.  Constitutional:      Appearance: He is well-developed.  HENT:     Head: Normocephalic and atraumatic.  Eyes:     Conjunctiva/sclera: Conjunctivae normal.     Pupils: Pupils are equal, round, and reactive to light.  Cardiovascular:     Rate and Rhythm: Normal rate and regular rhythm.     Heart sounds: Normal heart sounds.  Pulmonary:     Effort: Pulmonary effort is normal.     Breath sounds: Normal breath sounds.  Abdominal:     General: Bowel sounds are normal.     Palpations: Abdomen is soft.  Musculoskeletal:     Cervical back: Normal range of motion and neck supple.  Skin:    General: Skin is warm and dry.     Capillary Refill: Capillary refill takes less than 2 seconds.  Neurological:     Mental Status: He is alert and oriented to person, place, and time.  Psychiatric:         Behavior: Behavior normal.      Musculoskeletal Exam: Cervical, thoracic and lumbar spine were in good range of motion.  He had tenderness throughout the paravertebral region.  He had tenderness over the trapezius region.  He had tenderness over costochondral region.  Shoulder joints, elbow joints, wrist joints, MCPs PIPs and DIPs been good range of motion with no synovitis.  Hip joints and knee joints were in good range of motion.  He had discomfort range of motion of bilateral hip joints.  He had tenderness over trochanteric region.  Knee joints were in good range of motion without any warmth swelling or effusion.  He had tenderness over the medial aspect of his both knees.  Ankle joints, MCPs PIPs and DIPs been good range of motion with no synovitis.  He had generalized hyperalgesia and positive tender points.  CDAI Exam: CDAI Score: -- Patient Global: --; Provider Global: -- Swollen: --; Tender: -- Joint Exam 05/07/2022   No joint exam has been documented for this visit   There is currently no information documented on the homunculus. Go to the Rheumatology activity and complete the homunculus joint exam.  Investigation: No additional findings.  Imaging: DG Hand Complete Left  Result Date: 05/03/2022 CLINICAL DATA:  L hand pain EXAM: LEFT HAND - COMPLETE 3+ VIEW COMPARISON:  None Available. FINDINGS: No acute fracture or dislocation. Joint spaces and alignment are maintained. No area of erosion or osseous destruction. No unexpected radiopaque foreign body. Soft tissues are unremarkable. IMPRESSION: No acute fracture or dislocation. Electronically Signed   By: Valentino Saxon M.D.   On: 05/03/2022 17:17   DG Cervical Spine Complete  Result Date: 05/03/2022 CLINICAL DATA:  Polyarthralgia, neck pain rad to L shoulder and arm EXAM: CERVICAL SPINE - COMPLETE 4+ VIEW COMPARISON:  October twenty-seventh 2022 FINDINGS: The cervical spine is visualized from C1-C7. Cervical alignment is  maintained. Vertebral body heights are maintained: no evidence of acute fracture. Intervertebral spaces are maintained without significant degenerative changes. No prevertebral soft tissue swelling. Visualized thorax is unremarkable. IMPRESSION: Unremarkable cervical spine radiographs. Electronically Signed   By: Valentino Saxon M.D.   On: 05/03/2022 17:16    Recent Labs: Lab Results  Component Value Date   WBC 8.2 04/30/2022   HGB 14.6 04/30/2022   PLT 262 04/30/2022   NA 140 04/30/2022   K 4.0 04/30/2022   CL 104 04/30/2022   CO2 27 04/30/2022   GLUCOSE 88 04/30/2022   BUN 16 04/30/2022   CREATININE 1.00 04/30/2022   BILITOT 0.3 04/30/2022   ALKPHOS 48 11/03/2020   AST 17 04/30/2022   ALT 40 04/30/2022   PROT 7.1 04/30/2022   ALBUMIN 5.0 11/03/2020   CALCIUM 9.3 04/30/2022   GFRAA >60 01/05/2017     May 03, 2022 x-ray of the cervical spine normal May 03, 2022 x-ray of the left hand normal  Speciality Comments: No specialty comments available.  Procedures:  No procedures performed Allergies: Amoxicillin, Biaxin [clarithromycin], Ceftin [cefuroxime axetil], Penicillins, and Tetracyclines & related   Assessment / Plan:     Visit Diagnoses: Polyarthralgia -patient has been complaining of pain and discomfort in multiple joints since May 2023.  He states he has been taking Celebrex which does not help.  He is also on Cymbalta which does not help with the joint pain.  He continues to have generalized achiness.  He has not noticed any joint swelling.  09/30/21: ANA negative, RF<10, ESR<1, CRP<1, TSH 0.92January 26, 2024 Lyme negative, HLA-B27 negative, sed rate 2, TSH normal, uric acid 7.5  Pain in both hands -he complains of pain and discomfort in his bilateral hands.  He denies any history of joint swelling.  He states that the pain from his hands radiates up to his elbows.  No synovitis was noted on the examination.  He also had x-ray of his left hand from November 01, 2022  which was within normal limits.  X-ray findings were reviewed with the patient.  I will obtain x-ray of his right hand and also anti-CCP antibody.  All other autoimmune workup was negative in the past.  Plan: XR Hand 2 View Right, x-rays showed mild CMC and PIP narrowing.  Cyclic citrul peptide antibody,  IgG  Chronic pain of both hips -he complains of discomfort in his bilateral hips.  He complains of pain in hip joints and also in the trochanteric region.  He had discomfort in the SI joint region.  HLA-B27 was negative.  Plan: XR HIPS BILAT W OR W/O PELVIS 3-4 VIEWS.  X-rays of bilateral hip joints and SI joints were unremarkable.  Chronic pain of both knees -he complains of discomfort in his bilateral knee joints.  No warmth swelling or effusion was noted.  He had tenderness over the medial part of the knee joint consistent with tender points.  Plan: XR KNEE 3 VIEW RIGHT, XR KNEE 3 VIEW LEFT x-rays of bilateral knee joints were unremarkable.  Myofascial pain syndrome -he has generalized pain, hyperalgesia and positive tender points.  He also has history of IBS.  The symptoms are consistent with fibromyalgia syndrome.  Detailed counsel regarding fibromyalgia syndrome was provided.  I will obtain some additional labs today.  Plan: CK, Serum protein electrophoresis with reflex.  Need for regular light exercise, benefits from water aerobics, swimming, yoga and stretching were also discussed.  Good sleep hygiene was also discussed.  I will refer him to integrative therapies.  History of gastroesophageal reflux (GERD)  History of IBS-patient states he had colonoscopy in the past which was negative.  He has been followed by gastroenterology.  History of asthma  Seasonal allergies  POTS (postural orthostatic tachycardia syndrome)-diagnosed at a young age.  History of vertigo  Restless leg syndrome  Smoker - 3-4 cigarettes / day x 10 years  Orders: Orders Placed This Encounter  Procedures   XR  KNEE 3 VIEW RIGHT   XR KNEE 3 VIEW LEFT   XR HIPS BILAT W OR W/O PELVIS 3-4 VIEWS   XR Hand 2 View Right   CK   Cyclic citrul peptide antibody, IgG   Serum protein electrophoresis with reflex   Ambulatory referral to Physical Therapy   No orders of the defined types were placed in this encounter.    Follow-Up Instructions: Return for Arthralgias, myalgias.   Bo Merino, MD  Note - This record has been created using Editor, commissioning.  Chart creation errors have been sought, but may not always  have been located. Such creation errors do not reflect on  the standard of medical care.,

## 2022-04-29 ENCOUNTER — Encounter: Payer: Self-pay | Admitting: Family Medicine

## 2022-04-30 ENCOUNTER — Ambulatory Visit (INDEPENDENT_AMBULATORY_CARE_PROVIDER_SITE_OTHER): Payer: Commercial Managed Care - HMO | Admitting: Family Medicine

## 2022-04-30 ENCOUNTER — Encounter: Payer: Self-pay | Admitting: Family Medicine

## 2022-04-30 VITALS — BP 105/70 | HR 70 | Temp 98.0°F | Ht 70.5 in | Wt 182.4 lb

## 2022-04-30 DIAGNOSIS — M542 Cervicalgia: Secondary | ICD-10-CM

## 2022-04-30 DIAGNOSIS — M792 Neuralgia and neuritis, unspecified: Secondary | ICD-10-CM | POA: Diagnosis not present

## 2022-04-30 DIAGNOSIS — K219 Gastro-esophageal reflux disease without esophagitis: Secondary | ICD-10-CM

## 2022-04-30 DIAGNOSIS — M79642 Pain in left hand: Secondary | ICD-10-CM | POA: Diagnosis not present

## 2022-04-30 DIAGNOSIS — M2550 Pain in unspecified joint: Secondary | ICD-10-CM | POA: Diagnosis not present

## 2022-04-30 DIAGNOSIS — F17200 Nicotine dependence, unspecified, uncomplicated: Secondary | ICD-10-CM

## 2022-04-30 MED ORDER — OMEPRAZOLE 40 MG PO CPDR: DELAYED_RELEASE_CAPSULE | ORAL | 1 refills | Status: DC

## 2022-04-30 MED ORDER — DULOXETINE HCL 60 MG PO CPEP: 60.0000 mg | ORAL_CAPSULE | Freq: Every day | ORAL | 3 refills | Status: DC

## 2022-04-30 MED ORDER — PROPRANOLOL HCL 10 MG PO TABS: 10.0000 mg | ORAL_TABLET | Freq: Every day | ORAL | 1 refills | Status: DC

## 2022-04-30 NOTE — Progress Notes (Unsigned)
Office Note 05/05/2022  CC:  Chief Complaint  Patient presents with   Establish Care    HPI:  Dustin [name] is a 32 y.o. White male who is here accompanied by his wife Dianna to establish care and discuss joint pain. Patient's most recent primary MD: Derenda Fennel Old records were reviewed prior to or during today's visit.  Describes onset approximately 6 months ago of pain in all joints, symmetric.  Has left-sided headaches and left-sided neck pain, goes down the arm.  Also sometimes left leg pain. No muscle pain, no rashes, no fever, chills, or weight loss. No swelling or redness over the joints. He does recall a tick bite on right flank around the time he started developing his joint pain.  Remembers some redness around the bite but hard to tell how much.  Denies acute illness.  He is concerned about his lymph nodes in his neck being swollen, has been worked up in the past and unrevealing (CT soft tissue neck 05/26/2021 showed bilateral jugulodigastric lymph nodes reactive.  No family history of autoimmune, rheumatoid, or connective tissue disease.  Also having significant gastroesophageal reflux disease, particularly at night and in the morning.  He takes omeprazole 40 mg once in the morning.  He smokes, currently down to about 7 cigarettes a day.  Requests nicotine patch prescription.  Past Medical History:  Diagnosis Date   Alcoholism in recovery [facility])    Asthma    Bilateral headaches 01/27/2015   neuro->low serotonin level->cymbalta   Epiploic appendagitis    09/2020   GERD (gastroesophageal reflux disease)    hx esophagitis   Hearing loss    IBS (irritable bowel syndrome)    POTS (postural orthostatic tachycardia syndrome) 02/13/2016   Restless legs syndrome    Seasonal allergies    Vertigo     Past Surgical History:  Procedure Laterality Date   CARDIOVASCULAR STRESS TEST     12/2015 myoview normal   COLONOSCOPY  02/06/2020   ESOPHAGOGASTRODUODENOSCOPY      02/06/2020: Esophagitis, erosive gastro duodenopathy   INGUINAL HERNIA REPAIR     SINUS ENDO W/FUSION     WISDOM TOOTH EXTRACTION      Family History  Problem Relation Age of Onset   Migraines Mother    Arthritis Mother    Asthma Mother    Depression Mother    Hypertension Mother    Esophageal cancer Father 17   Early death Father    Arthritis Maternal Grandmother    Cancer Maternal Grandmother    Lung cancer Paternal Grandfather     Social History   Socioeconomic History   Marital status: Married    Spouse name: [name]   Number of children: 1   Years of education: [employer]   Highest education level: Not on file  Occupational History   Not on file  Tobacco Use   Smoking status: Former    Packs/day: 0.50    Years: 13.00    Total pack years: 6.50    Types: Cigarettes    Start date: 2008    Quit date: 04/2021    Years since quitting: 1.0   Smokeless tobacco: Never   Tobacco comments:    Still vapes occasionally  Vaping Use   Vaping Use: Every day   Substances: Nicotine, Flavoring  Substance and Sexual Activity   Alcohol use: Not Currently    Alcohol/week: 0.0 standard drinks of alcohol   Drug use: No   Sexual activity: Yes  Partners: Female  Other Topics Concern   Not on file  Social History Narrative   Patient lives at home with his wife [name]).   Patient is in the [employer]    Right handed.   Caffeine sweet tea once daily.   Social Determinants of Health   Financial Resource Strain: Not on file  Food Insecurity: Not on file  Transportation Needs: Not on file  Physical Activity: Not on file  Stress: Not on file  Social Connections: Not on file  Intimate Partner Violence: Not on file    Outpatient Encounter Medications as of 04/30/2022  Medication Sig   ALBUTEROL IN Inhale 1-2 puffs into the lungs as needed.   celecoxib (CELEBREX) 200 MG capsule Take 200 mg by mouth daily.   DULoxetine (CYMBALTA) 60 MG capsule Take 1 capsule (60 mg total) by  mouth daily.   omeprazole (PRILOSEC) 40 MG capsule 1 cap po bid   propranolol (INDERAL) 10 MG tablet Take 1 tablet (10 mg total) by mouth daily.   [DISCONTINUED] DULoxetine (CYMBALTA) 60 MG capsule Take 1 capsule (60 mg total) by mouth daily.   [DISCONTINUED] omeprazole (PRILOSEC) 40 MG capsule Take 1 capsule (40 mg total) by mouth daily.   [DISCONTINUED] propranolol (INDERAL) 10 MG tablet Take 10 mg by mouth daily.   No facility-administered encounter medications on file as of 04/30/2022.    Allergies  Allergen Reactions   Amoxicillin    Biaxin [Clarithromycin] Hives   Ceftin [Cefuroxime Axetil] Hives   Penicillins Hives    Has patient had a PCN reaction causing immediate rash, facial/tongue/throat swelling, SOB or lightheadedness with hypotension: Yes Has patient had a PCN reaction causing severe rash involving mucus membranes or skin necrosis: Yes Has patient had a PCN reaction that required hospitalization: No Has patient had a PCN reaction occurring within the last 10 years: No If all of the above answers are "NO", then may proceed with Cephalosporin use.    Tetracyclines & Related Hives    Review of Systems  Constitutional:  Negative for appetite change, chills, fatigue and fever.  HENT:  Negative for congestion, dental problem, ear pain and sore throat.   Eyes:  Negative for discharge, redness and visual disturbance.  Respiratory:  Negative for cough, chest tightness, shortness of breath and wheezing.   Cardiovascular:  Negative for chest pain, palpitations and leg swelling.  Gastrointestinal:  Negative for abdominal pain, blood in stool, diarrhea, nausea and vomiting.  Genitourinary:  Negative for difficulty urinating, dysuria, flank pain, frequency, hematuria and urgency.  Musculoskeletal:  Positive for arthralgias and neck pain (primarily L side). Negative for back pain, joint swelling, myalgias and neck stiffness.  Skin:  Negative for pallor and rash.  Neurological:   Negative for dizziness, speech difficulty, weakness and headaches.  Hematological:  Positive for adenopathy (neck). Does not bruise/bleed easily.  Psychiatric/Behavioral:  Negative for confusion and sleep disturbance. The patient is not nervous/anxious.     PE; Blood pressure 105/70, pulse 70, temperature 98 F (36.7 C), temperature source Oral, height 5' 10.5" (1.791 m), weight 182 lb 6.4 oz (82.7 kg), SpO2 96 %. Body mass index is 25.8 kg/m.  Physical Exam  Gen: Alert, well appearing.  Patient is oriented to person, place, time, and situation. AFFECT: pleasant, lucid thought and speech. ENT: Ears: EACs clear, normal epithelium.  TMs with good light reflex and landmarks bilaterally.  Eyes: no injection, icteris, swelling, or exudate.  EOMI, PERRLA. Nose: no drainage or turbinate edema/swelling.  No injection  or focal lesion.  Mouth: lips without lesion/swelling.  Oral mucosa pink and moist.  Dentition intact and without obvious caries or gingival swelling.  Oropharynx without erythema, exudate, or swelling.  Neck: supple/nontender.  No LAD, mass, or TM.  Carotid pulses 2+ bilaterally, without bruits. CV: RRR, no m/r/g.   LUNGS: CTA bilat, nonlabored resps, good aeration in all lung fields. ABD: soft, NT, ND, BS normal.  No hepatospenomegaly or mass.  No bruits. EXT: no clubbing, cyanosis, or edema.  Musculoskeletal: no joint swelling, erythema, warmth, or tenderness.  ROM of all joints intact. Pain with range of motion of L shoulder, left elbow, left wrist, and left hand/fingers. Neck: ROM intact, no tenderness Skin - no sores or suspicious lesions or rashes or color changes  Pertinent labs:  Last CBC Lab Results  Component Value Date   WBC 8.2 04/30/2022   HGB 14.6 04/30/2022   HCT 41.1 04/30/2022   MCV 90.3 04/30/2022   MCH 32.1 04/30/2022   RDW 13.1 04/30/2022   PLT 262 19/50/9326   Last metabolic panel Lab Results  Component Value Date   GLUCOSE 88 04/30/2022   NA 140  04/30/2022   K 4.0 04/30/2022   CL 104 04/30/2022   CO2 27 04/30/2022   BUN 16 04/30/2022   CREATININE 1.00 04/30/2022   GFRNONAA >60 11/03/2020   CALCIUM 9.3 04/30/2022   PROT 7.1 04/30/2022   ALBUMIN 5.0 11/03/2020   BILITOT 0.3 04/30/2022   ALKPHOS 48 11/03/2020   AST 17 04/30/2022   ALT 40 04/30/2022   ANIONGAP 11 11/03/2020   Last thyroid functions Lab Results  Component Value Date   TSH 1.17 04/30/2022   ASSESSMENT AND PLAN:   New pt, establishing care.  1) Polyarthralgia, unknown etiology. CBC, cmet, uric acid, lyme serology, HLA B27, esr. Check radiographs of L hand and neck. Will obtain prior PCP records.  #2 GERD, inadequate response lately with 40 mg omeprazole daily.  Increase to 40 mg twice daily.  3.  Tobacco dependence. Prescribed 7 mg patch daily x 1 month.  An After Visit Summary was printed and given to the patient.  Return in about 2 weeks (around 05/14/2022) for f/u pain.  Signed:  Crissie Sickles, MD           05/05/2022

## 2022-05-03 ENCOUNTER — Encounter: Payer: Self-pay | Admitting: Family Medicine

## 2022-05-03 ENCOUNTER — Ambulatory Visit (HOSPITAL_BASED_OUTPATIENT_CLINIC_OR_DEPARTMENT_OTHER)
Admission: RE | Admit: 2022-05-03 | Discharge: 2022-05-03 | Disposition: A | Discharge status: Home / Self Care | Payer: Commercial Managed Care - HMO | Source: Ambulatory Visit | Attending: Family Medicine | Admitting: Family Medicine

## 2022-05-03 DIAGNOSIS — M542 Cervicalgia: Secondary | ICD-10-CM | POA: Diagnosis present

## 2022-05-03 DIAGNOSIS — M79642 Pain in left hand: Secondary | ICD-10-CM

## 2022-05-03 LAB — CBC WITH DIFFERENTIAL/PLATELET
Absolute Monocytes: 508 cells/uL (ref 200–950)
Basophils Absolute: 57 cells/uL (ref 0–200)
Basophils Relative: 0.7 %
Eosinophils Absolute: 246 cells/uL (ref 15–500)
Eosinophils Relative: 3 %
HCT: 41.1 % (ref 38.5–50.0)
Hemoglobin: 14.6 g/dL (ref 13.2–17.1)
Lymphs Abs: 3288 cells/uL (ref 850–3900)
MCH: 32.1 pg (ref 27.0–33.0)
MCHC: 35.5 g/dL (ref 32.0–36.0)
MCV: 90.3 fL (ref 80.0–100.0)
MPV: 9.6 fL (ref 7.5–12.5)
Monocytes Relative: 6.2 %
Neutro Abs: 4100 cells/uL (ref 1500–7800)
Neutrophils Relative %: 50 %
Platelets: 262 10*3/uL (ref 140–400)
RBC: 4.55 10*6/uL (ref 4.20–5.80)
RDW: 13.1 % (ref 11.0–15.0)
Total Lymphocyte: 40.1 %
WBC: 8.2 10*3/uL (ref 3.8–10.8)

## 2022-05-03 LAB — SEDIMENTATION RATE: Sed Rate: 2 mm/h (ref 0–15)

## 2022-05-03 LAB — COMPREHENSIVE METABOLIC PANEL
AG Ratio: 2 (calc) (ref 1.0–2.5)
ALT: 40 U/L (ref 9–46)
AST: 17 U/L (ref 10–40)
Albumin: 4.7 g/dL (ref 3.6–5.1)
Alkaline phosphatase (APISO): 59 U/L (ref 36–130)
BUN: 16 mg/dL (ref 7–25)
CO2: 27 mmol/L (ref 20–32)
Calcium: 9.3 mg/dL (ref 8.6–10.3)
Chloride: 104 mmol/L (ref 98–110)
Creat: 1 mg/dL (ref 0.60–1.26)
Globulin: 2.4 g/dL (calc) (ref 1.9–3.7)
Glucose, Bld: 88 mg/dL (ref 65–99)
Potassium: 4 mmol/L (ref 3.5–5.3)
Sodium: 140 mmol/L (ref 135–146)
Total Bilirubin: 0.3 mg/dL (ref 0.2–1.2)
Total Protein: 7.1 g/dL (ref 6.1–8.1)

## 2022-05-03 LAB — TSH: TSH: 1.17 mIU/L (ref 0.40–4.50)

## 2022-05-03 LAB — URIC ACID: Uric Acid, Serum: 7.5 mg/dL (ref 4.0–8.0)

## 2022-05-03 LAB — LYME DISEASE SEROLOGY W/REFLEX: Lyme Total Antibody EIA: NEGATIVE

## 2022-05-03 LAB — HLA-B27 ANTIGEN: HLA-B27 Antigen: NEGATIVE

## 2022-05-03 MED ORDER — NICOTINE 7 MG/24HR TD PT24: 7.0000 mg | MEDICATED_PATCH | Freq: Every day | TRANSDERMAL | 0 refills | Status: DC

## 2022-05-03 NOTE — Telephone Encounter (Signed)
Ok, rx sent

## 2022-05-03 NOTE — Telephone Encounter (Signed)
Please send rx to [facility]

## 2022-05-03 NOTE — Addendum Note (Signed)
Addended by: Tammi Sou on: 05/03/2022 04:46 PM   Modules accepted: Orders

## 2022-05-05 ENCOUNTER — Encounter: Payer: Self-pay | Admitting: Family Medicine

## 2022-05-06 NOTE — Telephone Encounter (Signed)
Noted.  Excellent.

## 2022-05-07 ENCOUNTER — Ambulatory Visit (INDEPENDENT_AMBULATORY_CARE_PROVIDER_SITE_OTHER): Payer: Commercial Managed Care - HMO

## 2022-05-07 ENCOUNTER — Ambulatory Visit: Payer: Commercial Managed Care - HMO

## 2022-05-07 ENCOUNTER — Ambulatory Visit: Payer: Commercial Managed Care - HMO | Attending: Rheumatology | Admitting: Rheumatology

## 2022-05-07 ENCOUNTER — Encounter: Payer: Self-pay | Admitting: Rheumatology

## 2022-05-07 VITALS — BP 119/83 | HR 82 | Ht 70.5 in | Wt 179.0 lb

## 2022-05-07 DIAGNOSIS — M25552 Pain in left hip: Secondary | ICD-10-CM

## 2022-05-07 DIAGNOSIS — M79642 Pain in left hand: Secondary | ICD-10-CM

## 2022-05-07 DIAGNOSIS — M25561 Pain in right knee: Secondary | ICD-10-CM

## 2022-05-07 DIAGNOSIS — M25562 Pain in left knee: Secondary | ICD-10-CM

## 2022-05-07 DIAGNOSIS — G8929 Other chronic pain: Secondary | ICD-10-CM | POA: Diagnosis not present

## 2022-05-07 DIAGNOSIS — J302 Other seasonal allergic rhinitis: Secondary | ICD-10-CM

## 2022-05-07 DIAGNOSIS — Z8719 Personal history of other diseases of the digestive system: Secondary | ICD-10-CM

## 2022-05-07 DIAGNOSIS — M7918 Myalgia, other site: Secondary | ICD-10-CM

## 2022-05-07 DIAGNOSIS — M25551 Pain in right hip: Secondary | ICD-10-CM | POA: Diagnosis not present

## 2022-05-07 DIAGNOSIS — Z87898 Personal history of other specified conditions: Secondary | ICD-10-CM

## 2022-05-07 DIAGNOSIS — Z8709 Personal history of other diseases of the respiratory system: Secondary | ICD-10-CM

## 2022-05-07 DIAGNOSIS — M2550 Pain in unspecified joint: Secondary | ICD-10-CM

## 2022-05-07 DIAGNOSIS — G90A Postural orthostatic tachycardia syndrome (POTS): Secondary | ICD-10-CM

## 2022-05-07 DIAGNOSIS — G2581 Restless legs syndrome: Secondary | ICD-10-CM

## 2022-05-07 DIAGNOSIS — M79641 Pain in right hand: Secondary | ICD-10-CM

## 2022-05-07 DIAGNOSIS — F17200 Nicotine dependence, unspecified, uncomplicated: Secondary | ICD-10-CM

## 2022-05-10 LAB — PROTEIN ELECTROPHORESIS, SERUM, WITH REFLEX
Albumin ELP: 5 g/dL — ABNORMAL HIGH (ref 3.8–4.8)
Alpha 1: 0.3 g/dL (ref 0.2–0.3)
Alpha 2: 0.6 g/dL (ref 0.5–0.9)
Beta 2: 0.3 g/dL (ref 0.2–0.5)
Beta Globulin: 0.4 g/dL (ref 0.4–0.6)
Gamma Globulin: 0.9 g/dL (ref 0.8–1.7)
Total Protein: 7.5 g/dL (ref 6.1–8.1)

## 2022-05-10 LAB — CYCLIC CITRUL PEPTIDE ANTIBODY, IGG: Cyclic Citrullin Peptide Ab: <16 UNITS

## 2022-05-10 LAB — CK: Total CK: 100 U/L (ref 44–196)

## 2022-05-10 NOTE — Progress Notes (Signed)
I will discuss at the fu visit.

## 2022-05-14 ENCOUNTER — Encounter: Payer: Self-pay | Admitting: Family Medicine

## 2022-05-14 ENCOUNTER — Ambulatory Visit (INDEPENDENT_AMBULATORY_CARE_PROVIDER_SITE_OTHER): Payer: Commercial Managed Care - HMO | Admitting: Family Medicine

## 2022-05-14 VITALS — BP 111/75 | HR 64 | Temp 97.8°F | Ht 70.5 in | Wt 181.6 lb

## 2022-05-14 DIAGNOSIS — M19042 Primary osteoarthritis, left hand: Secondary | ICD-10-CM

## 2022-05-14 DIAGNOSIS — M797 Fibromyalgia: Secondary | ICD-10-CM | POA: Diagnosis not present

## 2022-05-14 DIAGNOSIS — K219 Gastro-esophageal reflux disease without esophagitis: Secondary | ICD-10-CM | POA: Diagnosis not present

## 2022-05-14 DIAGNOSIS — M2550 Pain in unspecified joint: Secondary | ICD-10-CM

## 2022-05-14 MED ORDER — DULOXETINE HCL 30 MG PO CPEP: 30.0000 mg | ORAL_CAPSULE | Freq: Every day | ORAL | 0 refills | Status: DC

## 2022-05-14 NOTE — Progress Notes (Unsigned)
OFFICE VISIT  05/14/2022  CC:  Chief Complaint  Patient presents with   Medical Management of Chronic Issues   Patient is a 32 y.o. male who presents for 2-week follow-up polyarthralgia and GERD. A/P as of last visit: "1) Polyarthralgia, unknown etiology. CBC, cmet, uric acid, lyme serology, HLA B27, esr. Check radiographs of L hand and neck. Will obtain prior PCP records.   #2 GERD, inadequate response lately with 40 mg omeprazole daily.  Increase to 40 mg twice daily.   3.  Tobacco dependence. Prescribed 7 mg patch daily x 1 month."  INTERIM HX: Since I last saw him he was seen by Dr. Estanislado Pandy, rheumatologist (05/07/22). I reviewed that encounter today.  Diagnosis fibromyalgia syndrome. She did some additional x-rays (both knees, both hips, right hand.  Additionally she has ordered CK, CCP IgG antibody, and serum protein electrophoresis--> all normal.  She referred him to physical therapy.  Took 63m cymbalta x 1 d and vomited. He had been off this med for a while so   Past Medical History:  Diagnosis Date   Alcoholism in recovery ([facility]    Asthma    Bilateral headaches 01/27/2015   neuro->low serotonin level->cymbalta   Epiploic appendagitis    09/2020   GERD (gastroesophageal reflux disease)    hx esophagitis   Hearing loss    IBS (irritable bowel syndrome)    POTS (postural orthostatic tachycardia syndrome) 02/13/2016   Restless legs syndrome    Seasonal allergies    Vertigo     Past Surgical History:  Procedure Laterality Date   CARDIOVASCULAR STRESS TEST     12/2015 myoview normal   ESOPHAGOGASTRODUODENOSCOPY     02/06/2020: Esophagitis, erosive gastro duodenopathy   INGUINAL HERNIA REPAIR     bilat as a teen   SINUS ENDO W/FUSION     20s   WISDOM TOOTH EXTRACTION      Outpatient Medications Prior to Visit  Medication Sig Dispense Refill   ALBUTEROL IN Inhale 1-2 puffs into the lungs as needed.     celecoxib (CELEBREX) 200 MG capsule Take 200 mg by  mouth daily.     DULoxetine (CYMBALTA) 60 MG capsule Take 1 capsule (60 mg total) by mouth daily. 90 capsule 3   nicotine (NICODERM CQ - DOSED IN MG/24 HR) 7 mg/24hr patch Place 1 patch (7 mg total) onto the skin daily. 28 patch 0   omeprazole (PRILOSEC) 40 MG capsule 1 cap po bid 180 capsule 1   propranolol (INDERAL) 10 MG tablet Take 1 tablet (10 mg total) by mouth daily. 90 tablet 1   No facility-administered medications prior to visit.    Allergies  Allergen Reactions   Amoxicillin    Biaxin [Clarithromycin] Hives   Ceftin [Cefuroxime Axetil] Hives   Penicillins Hives    Has patient had a PCN reaction causing immediate rash, facial/tongue/throat swelling, SOB or lightheadedness with hypotension: Yes Has patient had a PCN reaction causing severe rash involving mucus membranes or skin necrosis: Yes Has patient had a PCN reaction that required hospitalization: No Has patient had a PCN reaction occurring within the last 10 years: No If all of the above answers are "NO", then may proceed with Cephalosporin use.    Tetracyclines & Related Hives    Review of Systems As per HPI  PE:    05/14/2022    3:00 PM 05/07/2022    9:26 AM 05/07/2022    9:18 AM  Vitals with BMI  Height 5' 10.5"  5'  10.5"  Weight 181 lbs 10 oz  179 lbs  BMI 123XX123  123XX123  Systolic [id number]  Diastolic 75 83 80  Pulse 64 82 78     Physical Exam  Gen: Alert, well appearing.  Patient is oriented to person, place, time, and situation. AFFECT: pleasant, lucid thought and speech. R hand: no swelling or erythema. Mild TTP over index finger PIP and DIP.  ROM intact.  LABS:  Last CBC Lab Results  Component Value Date   WBC 8.2 04/30/2022   HGB 14.6 04/30/2022   HCT 41.1 04/30/2022   MCV 90.3 04/30/2022   MCH 32.1 04/30/2022   RDW 13.1 04/30/2022   PLT 262 [id number]   Last metabolic panel Lab Results  Component Value Date   GLUCOSE 88 04/30/2022   NA 140 04/30/2022   K 4.0 04/30/2022   CL 104  04/30/2022   CO2 27 04/30/2022   BUN 16 04/30/2022   CREATININE 1.00 04/30/2022   GFRNONAA >60 11/03/2020   CALCIUM 9.3 04/30/2022   PROT 7.5 05/07/2022   ALBUMIN 5.0 11/03/2020   BILITOT 0.3 04/30/2022   ALKPHOS 48 11/03/2020   AST 17 04/30/2022   ALT 40 04/30/2022   ANIONGAP 11 11/03/2020   Last thyroid functions Lab Results  Component Value Date   TSH 1.17 04/30/2022   Lab Results  Component Value Date   ESRSEDRATE 2 04/30/2022   IMPRESSION AND PLAN:  No problem-specific Assessment & Plan notes found for this encounter.  He has follow-up set with rheumatologist on 06/02/2022.  An After Visit Summary was printed and given to the patient.  FOLLOW UP: No follow-ups on file.  Signed:  Crissie Sickles, MD           05/14/2022

## 2022-05-19 NOTE — Progress Notes (Deleted)
Office Visit Note  Patient: Dustin [name]             Date of Birth: 01-Mar-1991           MRN: [id number]             PCP: Tammi Sou, MD Referring: Tammi Sou, MD Visit Date: 06/02/2022 Occupation: @GUAROCC$ @  Subjective:  No chief complaint on file.   History of Present Illness: Dustin [name] is a 32 y.o. male ***     Activities of Daily Living:  Patient reports morning stiffness for *** {minute/hour:19697}.   Patient {ACTIONS;DENIES/REPORTS:21021675::"Denies"} nocturnal pain.  Difficulty dressing/grooming: {ACTIONS;DENIES/REPORTS:21021675::"Denies"} Difficulty climbing stairs: {ACTIONS;DENIES/REPORTS:21021675::"Denies"} Difficulty getting out of chair: {ACTIONS;DENIES/REPORTS:21021675::"Denies"} Difficulty using hands for taps, buttons, cutlery, and/or writing: {ACTIONS;DENIES/REPORTS:21021675::"Denies"}  No Rheumatology ROS completed.   PMFS History:  Patient Active Problem List   Diagnosis Date Noted   Gastroesophageal reflux disease 12/27/2019   Dysphagia 12/27/2019   Generalized abdominal pain 12/27/2019   Pyrosis 12/27/2019   Diarrhea 02/26/2019   Irritability 12/05/2018   Allergic rhinitis 12/05/2018   POTS (postural orthostatic tachycardia syndrome) 02/13/2016   Epistaxis 10/28/2015   Bilateral headaches 01/27/2015    Past Medical History:  Diagnosis Date   Alcoholism in recovery [facility])    Asthma    Bilateral headaches 01/27/2015   neuro->low serotonin level->cymbalta   Epiploic appendagitis    09/2020   GERD (gastroesophageal reflux disease)    hx esophagitis   Hearing loss    IBS (irritable bowel syndrome)    POTS (postural orthostatic tachycardia syndrome) 02/13/2016   Restless legs syndrome    Seasonal allergies    Vertigo     Family History  Problem Relation Age of Onset   Migraines Mother    Arthritis Mother    Asthma Mother    Depression Mother    Hypertension Mother    Esophageal cancer Father 36   Early death  Father    Arthritis Maternal Grandmother    Cancer Maternal Grandmother    Lung cancer Paternal Grandfather    Past Surgical History:  Procedure Laterality Date   CARDIOVASCULAR STRESS TEST     12/2015 myoview normal   ESOPHAGOGASTRODUODENOSCOPY     02/06/2020: Esophagitis, erosive gastro duodenopathy   INGUINAL HERNIA REPAIR     bilat as a teen   SINUS ENDO W/FUSION     25s   WISDOM TOOTH EXTRACTION     Social History   Social History Narrative   Patient lives at home with his wife Beverlee Nims).   Educ: HS   Occup: truck driver   +tob   Hx alc abuse   Immunization History  Administered Date(s) Administered   Hepatitis B, PED/ADOLESCENT 12/23/1999, 01/29/2000, 06/16/2000   Influenza,inj,Quad PF,6+ Mos 11/29/2016, 02/26/2019   Influenza-Unspecified 02/01/2022   Meningococcal Conjugate 09/28/2006   Tdap 07/13/2006, 03/21/2017     Objective: Vital Signs: There were no vitals taken for this visit.   Physical Exam   Musculoskeletal Exam: ***  CDAI Exam: CDAI Score: -- Patient Global: --; Provider Global: -- Swollen: --; Tender: -- Joint Exam 06/02/2022   No joint exam has been documented for this visit   There is currently no information documented on the homunculus. Go to the Rheumatology activity and complete the homunculus joint exam.  Investigation: No additional findings.  Imaging: XR HIPS BILAT W OR W/O PELVIS 3-4 VIEWS  Result Date: 05/07/2022 No hip joint narrowing was noted.  No SI joint narrowing or sclerosis was  noted.  No chondrocalcinosis was noted. Impression: Unremarkable x-rays of the hip joints and SI joints.  XR KNEE 3 VIEW LEFT  Result Date: 05/07/2022 No medial or lateral compartment narrowing was noted.  No patellofemoral narrowing was noted.  No chondrocalcinosis was noted. Impression: Unremarkable x-rays of the knee joint.  XR KNEE 3 VIEW RIGHT  Result Date: 05/07/2022 No medial or lateral compartment narrowing was noted.  No patellofemoral  narrowing was noted.  No chondrocalcinosis was noted. Impression: Unremarkable x-rays of the knee joint.  XR Hand 2 View Right  Result Date: 05/07/2022 Mild CMC and PIP narrowing was noted.  No DIP, MCP, intercarpal or radiocarpal joint space narrowing was noted.  No erosive changes were noted. 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  DG Hand Complete Left  Result Date: 05/03/2022 CLINICAL DATA:  L hand pain EXAM: LEFT HAND - COMPLETE 3+ VIEW COMPARISON:  None Available. FINDINGS: No acute fracture or dislocation. Joint spaces and alignment are maintained. No area of erosion or osseous destruction. No unexpected radiopaque foreign body. Soft tissues are unremarkable. IMPRESSION: No acute fracture or dislocation. Electronically Signed   By: Valentino Saxon M.D.   On: 05/03/2022 17:17   DG Cervical Spine Complete  Result Date: 05/03/2022 CLINICAL DATA:  Polyarthralgia, neck pain rad to L shoulder and arm EXAM: CERVICAL SPINE - COMPLETE 4+ VIEW COMPARISON:  October twenty-seventh 2022 FINDINGS: The cervical spine is visualized from C1-C7. Cervical alignment is maintained. Vertebral body heights are maintained: no evidence of acute fracture. Intervertebral spaces are maintained without significant degenerative changes. No prevertebral soft tissue swelling. Visualized thorax is unremarkable. IMPRESSION: Unremarkable cervical spine radiographs. Electronically Signed   By: Valentino Saxon M.D.   On: 05/03/2022 17:16    Recent Labs: Lab Results  Component Value Date   WBC 8.2 04/30/2022   HGB 14.6 04/30/2022   PLT 262 04/30/2022   NA 140 04/30/2022   K 4.0 04/30/2022   CL 104 04/30/2022   CO2 27 04/30/2022   GLUCOSE 88 04/30/2022   BUN 16 04/30/2022   CREATININE 1.00 04/30/2022   BILITOT 0.3 04/30/2022   ALKPHOS 48 11/03/2020   AST 17 04/30/2022   ALT 40 04/30/2022   PROT 7.5 05/07/2022   ALBUMIN 5.0 11/03/2020   CALCIUM 9.3 04/30/2022   GFRAA  >60 01/05/2017   May 07, 2022 SPEP normal, CK100, anti-CCP negative  09/30/21: ANA negative, RF<10, ESR<1, CRP<1, TSH 0.92 April 30, 2022 Lyme negative, HLA-B27 negative, sed rate 2, TSH normal, uric acid 7.5   Speciality Comments: No specialty comments available.  Procedures:  No procedures performed Allergies: Amoxicillin, Biaxin [clarithromycin], Ceftin [cefuroxime axetil], Penicillins, and Tetracyclines & related   Assessment / Plan:     Visit Diagnoses: No diagnosis found.  Orders: No orders of the defined types were placed in this encounter.  No orders of the defined types were placed in this encounter.   Face-to-face time spent with patient was *** minutes. Greater than 50% of time was spent in counseling and coordination of care.  Follow-Up Instructions: No follow-ups on file.   Bo Merino, MD  Note - This record has been created using Editor, commissioning.  Chart creation errors have been sought, but may not always  have been located. Such creation errors do not reflect on  the standard of medical care.

## 2022-06-02 ENCOUNTER — Encounter: Payer: Self-pay | Admitting: Family Medicine

## 2022-06-02 ENCOUNTER — Ambulatory Visit: Payer: Commercial Managed Care - HMO | Admitting: Rheumatology

## 2022-06-02 DIAGNOSIS — M79642 Pain in left hand: Secondary | ICD-10-CM

## 2022-06-02 DIAGNOSIS — M7918 Myalgia, other site: Secondary | ICD-10-CM

## 2022-06-02 DIAGNOSIS — M2550 Pain in unspecified joint: Secondary | ICD-10-CM

## 2022-06-02 DIAGNOSIS — G8929 Other chronic pain: Secondary | ICD-10-CM

## 2022-06-02 DIAGNOSIS — G2581 Restless legs syndrome: Secondary | ICD-10-CM

## 2022-06-02 DIAGNOSIS — Z8719 Personal history of other diseases of the digestive system: Secondary | ICD-10-CM

## 2022-06-02 DIAGNOSIS — Z87898 Personal history of other specified conditions: Secondary | ICD-10-CM

## 2022-06-02 DIAGNOSIS — J302 Other seasonal allergic rhinitis: Secondary | ICD-10-CM

## 2022-06-02 DIAGNOSIS — Z8709 Personal history of other diseases of the respiratory system: Secondary | ICD-10-CM

## 2022-06-02 DIAGNOSIS — G90A Postural orthostatic tachycardia syndrome (POTS): Secondary | ICD-10-CM

## 2022-06-02 DIAGNOSIS — F17200 Nicotine dependence, unspecified, uncomplicated: Secondary | ICD-10-CM

## 2022-06-03 ENCOUNTER — Ambulatory Visit: Payer: Self-pay

## 2022-06-07 ENCOUNTER — Other Ambulatory Visit: Payer: Self-pay | Admitting: Family Medicine

## 2022-06-10 ENCOUNTER — Other Ambulatory Visit: Payer: Self-pay | Admitting: Family Medicine

## 2022-06-10 NOTE — Telephone Encounter (Signed)
Please fill, if appropriate.

## 2022-06-10 NOTE — Telephone Encounter (Signed)
Patient wife, Beverlee Nims [facility]) called regarding stomach issues.  Patient thinks it might be side effect from taking Cymbalta.  Started having stomach issues right after starting medication.  Please advise

## 2022-06-11 ENCOUNTER — Other Ambulatory Visit: Payer: Self-pay | Admitting: Family Medicine

## 2022-06-11 NOTE — Telephone Encounter (Signed)
See mychart message regarding medication and advise if refill appropriate.

## 2022-06-11 NOTE — Telephone Encounter (Signed)
OK to stop the cymbalta and see if stomach symptoms resolve. Encourage office f/u if not improving after he has been off the cymbalta for 5 days.

## 2022-07-23 ENCOUNTER — Encounter: Payer: Self-pay | Admitting: Family Medicine

## 2022-07-23 ENCOUNTER — Ambulatory Visit (INDEPENDENT_AMBULATORY_CARE_PROVIDER_SITE_OTHER): Payer: Commercial Managed Care - HMO | Admitting: Family Medicine

## 2022-07-23 VITALS — BP 111/72 | HR 75 | Wt 186.0 lb

## 2022-07-23 DIAGNOSIS — R590 Localized enlarged lymph nodes: Secondary | ICD-10-CM | POA: Diagnosis not present

## 2022-07-23 DIAGNOSIS — R221 Localized swelling, mass and lump, neck: Secondary | ICD-10-CM

## 2022-07-23 DIAGNOSIS — K2100 Gastro-esophageal reflux disease with esophagitis, without bleeding: Secondary | ICD-10-CM | POA: Diagnosis not present

## 2022-07-23 DIAGNOSIS — K219 Gastro-esophageal reflux disease without esophagitis: Secondary | ICD-10-CM

## 2022-07-23 MED ORDER — METOCLOPRAMIDE HCL 5 MG PO TABS: ORAL_TABLET | ORAL | 0 refills | Status: DC

## 2022-07-23 NOTE — Progress Notes (Signed)
OFFICE VISIT  07/23/2022  CC:  Chief Complaint  Patient presents with   Acute Visit    Swelling on the left side of his neck that has been uncomfortable. He is also having issues with his hands going numb along with dizzy spells.    Patient is a 32 y.o. male who presents accompanied by his wife for left-sided neck swelling.  HPI: Dustin [name] feels constant swelling in the left side of his neck. This has been going on for couple of years, waxing and waning. No pain, just bothers him. He will occasionally have a sore in the side of his mouth or back of the throat but this is not a regular thing. He has no fevers, no night sweats, no abnormal weight loss. He has bad reflux and feels breakthrough symptoms despite being on omeprazole 40 mg twice a day.  Review of systems: Having some return of feeling of fogginess in the head, some dizziness that he has trouble describing. Some intermittent feeling of numbness in the hands.  05/06/21 CT soft tissue neck w/o contrast: "IMPRESSION: 1. No acute or focal lesion to explain the patient's symptoms. 2. Bilateral jugulodigastric lymph nodes are reactive. 3. Postsurgical changes of the paranasal sinuses.".   Past Medical History:  Diagnosis Date   Alcoholism in recovery    Asthma    Bilateral headaches 01/27/2015   neuro->low serotonin level->cymbalta   Epiploic appendagitis    09/2020   GERD (gastroesophageal reflux disease)    hx esophagitis   Hearing loss    IBS (irritable bowel syndrome)    POTS (postural orthostatic tachycardia syndrome) 02/13/2016   Restless legs syndrome    Seasonal allergies    Vertigo     Past Surgical History:  Procedure Laterality Date   CARDIOVASCULAR STRESS TEST     12/2015 myoview normal   ESOPHAGOGASTRODUODENOSCOPY     02/06/2020: Esophagitis, erosive gastro duodenopathy   INGUINAL HERNIA REPAIR     bilat as a teen   SINUS ENDO W/FUSION     20s   WISDOM TOOTH EXTRACTION      Outpatient Medications  Prior to Visit  Medication Sig Dispense Refill   ALBUTEROL IN Inhale 1-2 puffs into the lungs as needed.     celecoxib (CELEBREX) 200 MG capsule Take 200 mg by mouth daily.     DULoxetine (CYMBALTA) 30 MG capsule Take 1 capsule (30 mg total) by mouth daily. 30 capsule 0   DULoxetine (CYMBALTA) 60 MG capsule Take 1 capsule (60 mg total) by mouth daily. 90 capsule 3   omeprazole (PRILOSEC) 40 MG capsule 1 cap po bid 180 capsule 1   propranolol (INDERAL) 10 MG tablet Take 1 tablet (10 mg total) by mouth daily. 90 tablet 1   nicotine (NICODERM CQ - DOSED IN MG/24 HR) 7 mg/24hr patch PLACE 1 PATCH ONTO THE SKIN DAILY. (Patient not taking: Reported on 07/23/2022) 28 patch 0   No facility-administered medications prior to visit.    Allergies  Allergen Reactions   Amoxicillin    Biaxin [Clarithromycin] Hives   Ceftin [Cefuroxime Axetil] Hives   Penicillins Hives    Has patient had a PCN reaction causing immediate rash, facial/tongue/throat swelling, SOB or lightheadedness with hypotension: Yes Has patient had a PCN reaction causing severe rash involving mucus membranes or skin necrosis: Yes Has patient had a PCN reaction that required hospitalization: No Has patient had a PCN reaction occurring within the last 10 years: No If all of the above answers are "NO",  then may proceed with Cephalosporin use.    Tetracyclines & Related Hives    Review of Systems  As per HPI  PE:    07/23/2022    3:58 PM 05/14/2022    3:00 PM 05/07/2022    9:26 AM  Vitals with BMI  Height  5' 10.5"   Weight 186 lbs 181 lbs 10 oz   BMI  25.68   Systolic 111 111 630  Diastolic 72 75 83  Pulse 75 64 82     Physical Exam  Gen: Alert, well appearing.  Patient is oriented to person, place, time, and situation. AFFECT: pleasant, lucid thought and speech. ZSW:FUXN: no injection, icteris, swelling, or exudate.  EOMI, PERRLA. Mouth: lips without lesion/swelling.  Oral mucosa pink and moist. Oropharynx without  erythema, exudate, or swelling.  No focal oral lesion.  No tenderness or obvious erosion of any teeth. No TMJ tenderness. Neck: I can't feel any LAD, nodularity, mass, fluctuance, or induration.  No erythema.   LABS:  Last CBC Lab Results  Component Value Date   WBC 8.2 04/30/2022   HGB 14.6 04/30/2022   HCT 41.1 04/30/2022   MCV 90.3 04/30/2022   MCH 32.1 04/30/2022   RDW 13.1 04/30/2022   PLT 262 04/30/2022   Last metabolic panel Lab Results  Component Value Date   GLUCOSE 88 04/30/2022   NA 140 04/30/2022   K 4.0 04/30/2022   CL 104 04/30/2022   CO2 27 04/30/2022   BUN 16 04/30/2022   CREATININE 1.00 04/30/2022   GFRNONAA >60 11/03/2020   CALCIUM 9.3 04/30/2022   PROT 7.5 05/07/2022   ALBUMIN 5.0 11/03/2020   BILITOT 0.3 04/30/2022   ALKPHOS 48 11/03/2020   AST 17 04/30/2022   ALT 40 04/30/2022   ANIONGAP 11 11/03/2020   Last thyroid functions Lab Results  Component Value Date   TSH 1.17 04/30/2022   Lab Results  Component Value Date   ESRSEDRATE 2 04/30/2022   IMPRESSION AND PLAN:  #1 left-sided neck fullness. Soft tissue CT imaging a year ago showed some reactive bilateral jugulodigastric lymph nodes. He has had a couple of oral ulcers but nothing consistent that would cause constant reactive lymphadenopathy. Suspect his severe GERD/LPR is leading to all of this. We discussed repeat neck imaging but decided to hold off and readdress at follow-up in 3 weeks. See #2 below.  #2 severe GERD/LPR. Not well-controlled with omeprazole 40 mg twice a day. EGD in the past has confirmed esophagitis and gastritis but did not show any hiatal hernia.  H. pylori was negative. He does not take NSAIDs other than occasional BC powder for headache. Add trial of Reglan 5 mg at meals--he says he eats 2 meals a day. Continue omeprazole 40 mg twice daily.  An After Visit Summary was printed and given to the patient.  FOLLOW UP: Return in about 3 weeks (around 08/13/2022)  for virtual visit to f/u GERD/med.  Signed:  Santiago Bumpers, MD           07/23/2022

## 2022-07-29 ENCOUNTER — Ambulatory Visit: Payer: Commercial Managed Care - HMO | Admitting: Family Medicine

## 2022-08-12 ENCOUNTER — Ambulatory Visit: Payer: Commercial Managed Care - HMO | Admitting: Family Medicine

## 2022-08-13 ENCOUNTER — Encounter: Payer: Self-pay | Admitting: Family Medicine

## 2022-08-13 ENCOUNTER — Telehealth (INDEPENDENT_AMBULATORY_CARE_PROVIDER_SITE_OTHER): Payer: Commercial Managed Care - HMO | Admitting: Family Medicine

## 2022-08-13 VITALS — BP 111/72 | Wt 186.0 lb

## 2022-08-13 DIAGNOSIS — K219 Gastro-esophageal reflux disease without esophagitis: Secondary | ICD-10-CM | POA: Diagnosis not present

## 2022-08-13 DIAGNOSIS — M79642 Pain in left hand: Secondary | ICD-10-CM

## 2022-08-13 DIAGNOSIS — T50905D Adverse effect of unspecified drugs, medicaments and biological substances, subsequent encounter: Secondary | ICD-10-CM

## 2022-08-13 DIAGNOSIS — M79641 Pain in right hand: Secondary | ICD-10-CM

## 2022-08-13 MED ORDER — METOCLOPRAMIDE HCL 10 MG PO TABS: ORAL_TABLET | ORAL | 1 refills | Status: DC

## 2022-08-13 MED ORDER — DULOXETINE HCL 20 MG PO CPEP: 20.0000 mg | ORAL_CAPSULE | Freq: Every day | ORAL | 1 refills | Status: DC

## 2022-08-13 NOTE — Progress Notes (Unsigned)
Virtual Visit via Video Note  I connected with Dustin [name]  on 08/13/22 at  3:20 PM EDT by a video enabled telemedicine application and verified that I am speaking with the correct person using two identifiers.  Location patient: [place] Location provider:work or home office Persons participating in the virtual visit: patient, provider  I discussed the limitations and requested verbal permission for telemedicine visit. The patient expressed understanding and agreed to proceed.  CC: 32 year old male being seen today for 3-week follow-up severe GERD/LPR. A/P as of last visit: "#1 left-sided neck fullness. Soft tissue CT imaging a year ago showed some reactive bilateral jugulodigastric lymph nodes. He has had a couple of oral ulcers but nothing consistent that would cause constant reactive lymphadenopathy. Suspect his severe GERD/LPR is leading to all of this. We discussed repeat neck imaging but decided to hold off and readdress at follow-up in 3 weeks. See #2 below.   #2 severe GERD/LPR. Not well-controlled with omeprazole 40 mg twice a day. EGD in the past has confirmed esophagitis and gastritis but did not show any hiatal hernia.  H. pylori was negative. He does not take NSAIDs other than occasional BC powder for headache. Add trial of Reglan 5 mg at meals--he says he eats 2 meals a day. Continue omeprazole 40 mg twice daily."  INTERIM HX: He thinks the Reglan is helping a little bit.  Feels like the left side of the neck odd/discomfort sensation is a little bit less noticeable.  He says the Cymbalta has always caused him lots of nausea but it has helped completely resolve the pain he has in his hands all the time.  He tried cutting down to a 30 mg dose and nausea did not improve any.  No vomiting.   ROS: See pertinent positives and negatives per HPI.  Past Medical History:  Diagnosis Date   Alcoholism in recovery [facility])    Asthma    Bilateral headaches 01/27/2015   neuro->low serotonin  level->cymbalta   Epiploic appendagitis    09/2020   GERD (gastroesophageal reflux disease)    hx esophagitis   Hearing loss    IBS (irritable bowel syndrome)    POTS (postural orthostatic tachycardia syndrome) 02/13/2016   Restless legs syndrome    Seasonal allergies    Vertigo     Past Surgical History:  Procedure Laterality Date   CARDIOVASCULAR STRESS TEST     12/2015 myoview normal   ESOPHAGOGASTRODUODENOSCOPY     02/06/2020: Esophagitis, erosive gastro duodenopathy   INGUINAL HERNIA REPAIR     bilat as a teen   SINUS ENDO W/FUSION     20s   WISDOM TOOTH EXTRACTION       Current Outpatient Medications:    ALBUTEROL IN, Inhale 1-2 puffs into the lungs as needed., Disp: , Rfl:    celecoxib (CELEBREX) 200 MG capsule, Take 200 mg by mouth daily., Disp: , Rfl:    DULoxetine (CYMBALTA) 30 MG capsule, Take 1 capsule (30 mg total) by mouth daily., Disp: 30 capsule, Rfl: 0   DULoxetine (CYMBALTA) 60 MG capsule, Take 1 capsule (60 mg total) by mouth daily., Disp: 90 capsule, Rfl: 3   metoCLOPramide (REGLAN) 5 MG tablet, 1 tab po bid with meals, Disp: 60 tablet, Rfl: 0   omeprazole (PRILOSEC) 40 MG capsule, 1 cap po bid, Disp: 180 capsule, Rfl: 1   propranolol (INDERAL) 10 MG tablet, Take 1 tablet (10 mg total) by mouth daily., Disp: 90 tablet, Rfl: 1   nicotine (NICODERM CQ -  DOSED IN MG/24 HR) 7 mg/24hr patch, PLACE 1 PATCH ONTO THE SKIN DAILY. (Patient not taking: Reported on 07/23/2022), Disp: 28 patch, Rfl: 0  EXAM:  VITALS per patient if applicable:      08/13/2022    3:24 PM 07/23/2022    3:58 PM 05/14/2022    3:00 PM  Vitals with BMI  Height   5' 10.5"  Weight 186 lbs 186 lbs 181 lbs 10 oz  BMI   25.68  Systolic 111 111 161  Diastolic 72 72 75  Pulse  75 64     GENERAL: alert, oriented, appears well and in no acute distress  HEENT: atraumatic, conjunttiva clear, no obvious abnormalities on inspection of external nose and ears  NECK: normal movements of the head  and neck  LUNGS: on inspection no signs of respiratory distress, breathing rate appears normal, no obvious gross SOB, gasping or wheezing  CV: no obvious cyanosis  MS: moves all visible extremities without noticeable abnormality  PSYCH/NEURO: pleasant and cooperative, no obvious depression or anxiety, speech and thought processing grossly intact  LABS: none today    Chemistry      Component Value Date/Time   NA 140 04/30/2022 1551   K 4.0 04/30/2022 1551   CL 104 04/30/2022 1551   CO2 27 04/30/2022 1551   BUN 16 04/30/2022 1551   CREATININE 1.00 04/30/2022 1551      Component Value Date/Time   CALCIUM 9.3 04/30/2022 1551   ALKPHOS 48 11/03/2020 1734   AST 17 04/30/2022 1551   ALT 40 04/30/2022 1551   BILITOT 0.3 04/30/2022 1551     Lab Results  Component Value Date   WBC 8.2 04/30/2022   HGB 14.6 04/30/2022   HCT 41.1 04/30/2022   MCV 90.3 04/30/2022   PLT 262 04/30/2022   ASSESSMENT AND PLAN:  Discussed the following assessment and plan:  #1 GERD/LPR--slightly improved with recent addition of Reglan.  Will increase to 10 mg twice daily at meals. Continue omeprazole 40 mg twice a day.  2.  Chronic bilateral hand pain.  Seems to be helped by taking Cymbalta.  Even the 30 mg dose is sufficient.  However has excessive nausea.  He wants to stick with this medication and just go to a 20 mg dose to see if things improve.   I discussed the assessment and treatment plan with the patient. The patient was provided an opportunity to ask questions and all were answered. The patient agreed with the plan and demonstrated an understanding of the instructions.   F/u: 2-4 wks  Signed:  Santiago Bumpers, MD           08/13/2022

## 2022-09-07 ENCOUNTER — Other Ambulatory Visit: Payer: Self-pay | Admitting: Family Medicine

## 2022-10-05 ENCOUNTER — Other Ambulatory Visit: Payer: Self-pay | Admitting: Family Medicine

## 2022-10-09 ENCOUNTER — Other Ambulatory Visit: Payer: Self-pay | Admitting: Family Medicine

## 2022-11-09 ENCOUNTER — Encounter: Payer: Self-pay | Admitting: Family Medicine

## 2022-11-09 ENCOUNTER — Ambulatory Visit (INDEPENDENT_AMBULATORY_CARE_PROVIDER_SITE_OTHER): Payer: Commercial Managed Care - HMO | Admitting: Family Medicine

## 2022-11-09 VITALS — BP 117/79 | HR 81 | Wt 189.2 lb

## 2022-11-09 DIAGNOSIS — M79641 Pain in right hand: Secondary | ICD-10-CM | POA: Diagnosis not present

## 2022-11-09 DIAGNOSIS — M797 Fibromyalgia: Secondary | ICD-10-CM

## 2022-11-09 DIAGNOSIS — T50905D Adverse effect of unspecified drugs, medicaments and biological substances, subsequent encounter: Secondary | ICD-10-CM | POA: Diagnosis not present

## 2022-11-09 DIAGNOSIS — F410 Panic disorder [episodic paroxysmal anxiety] without agoraphobia: Secondary | ICD-10-CM | POA: Diagnosis not present

## 2022-11-09 DIAGNOSIS — M79642 Pain in left hand: Secondary | ICD-10-CM

## 2022-11-09 DIAGNOSIS — F411 Generalized anxiety disorder: Secondary | ICD-10-CM

## 2022-11-09 MED ORDER — ONDANSETRON HCL 4 MG PO TABS: ORAL_TABLET | ORAL | 0 refills | Status: DC

## 2022-11-09 NOTE — Progress Notes (Signed)
OFFICE VISIT  11/09/2022  CC:  Chief Complaint  Patient presents with   Medication Management    Wants to discuss getting off Cymbalta; 8/4 woke up in the middle of the night feeling like having a heart attack, EMS stated it was not a heart attack put was a panic attack. EMS also stated that the cymbalta caused the panic attack to escalate.     Patient is a 32 y.o. male who presents for anxiety, has medication concern. I last saw him for a virtual visit on 08/13/2022. A/P at that time was: "#1 GERD/LPR--slightly improved with recent addition of Reglan.  Will increase to 10 mg twice daily at meals. Continue omeprazole 40 mg twice a day.   2.  Chronic bilateral hand pain.  Seems to be helped by taking Cymbalta.  Even the 30 mg dose is sufficient.  However has excessive nausea.  He wants to stick with this medication and just go to a 20 mg dose to see if things improve."  HPI: 2 nights ago he felt some chest tightness before going to bed, which is often the signaled that he may be getting a panic attack.  It calmed down spontaneously and he was able to go to sleep fine.  He woke up around 4 AM with chest pain and heaviness, sweating, dizziness, tremulous, felt overwhelmed with anxiety/fear.  He thought he was having a heart attack so he had his wife call EMS. They evaluated him and diagnosed panic attack. Since Cymbalta was the most recent medication he had started they said it may have been provoked by Cymbalta.  He says he has NOT had any increased frequency of his panic attacks since getting on Cymbalta. Additionally, this medicine has helped very well for his bilateral hand pain. He does get about 3 hours of nausea in the morning since starting Cymbalta.  He feels like possibly this is gradually dissipating the longer he is on the medication.  He takes the medication around bedtime. He does have increased anxiety in his life today, currently in the midst of a stressful custody battle.  ROS  as above, plus--> no fevers, no HAs, no rashes, no melena/hematochezia.  No polyuria or polydipsia.  No myalgias or arthralgias.  No focal weakness, paresthesias, or tremors.  No acute vision or hearing abnormalities.  No dysuria or unusual/new urinary urgency or frequency.  No recent changes in lower legs. No vomiting or abdominal pain.   Past Medical History:  Diagnosis Date   Alcoholism in recovery [facility])    Asthma    Bilateral hand pain    cymbalta VERY helpful   Bilateral headaches 01/27/2015   neuro->low serotonin level->cymbalta   Epiploic appendagitis    09/2020   Fibromyalgia    rheum eval 2024   GAD (generalized anxiety disorder)    GERD (gastroesophageal reflux disease)    hx esophagitis   Hearing loss    IBS (irritable bowel syndrome)    Laryngopharyngeal reflux (LPR)    POTS (postural orthostatic tachycardia syndrome) 02/13/2016   Restless legs syndrome    Seasonal allergies    Vertigo     Past Surgical History:  Procedure Laterality Date   CARDIOVASCULAR STRESS TEST     12/2015 myoview normal   ESOPHAGOGASTRODUODENOSCOPY     02/06/2020: Esophagitis, erosive gastro duodenopathy   INGUINAL HERNIA REPAIR     bilat as a teen   SINUS ENDO W/FUSION     20s   WISDOM TOOTH EXTRACTION  Outpatient Medications Prior to Visit  Medication Sig Dispense Refill   ALBUTEROL IN Inhale 1-2 puffs into the lungs as needed.     celecoxib (CELEBREX) 200 MG capsule Take 200 mg by mouth daily.     DULoxetine (CYMBALTA) 20 MG capsule Take 1 capsule (20 mg total) by mouth daily. 30 capsule 1   metoCLOPramide (REGLAN) 10 MG tablet 1 tab po bid with meals 60 tablet 1   omeprazole (PRILOSEC) 40 MG capsule 1 cap po bid 180 capsule 1   propranolol (INDERAL) 10 MG tablet Take 1 tablet (10 mg total) by mouth daily. 90 tablet 1   nicotine (NICODERM CQ - DOSED IN MG/24 HR) 7 mg/24hr patch PLACE 1 PATCH ONTO THE SKIN DAILY. (Patient not taking: Reported on 07/23/2022) 28 patch 0   No  facility-administered medications prior to visit.    Allergies  Allergen Reactions   Amoxicillin    Biaxin [Clarithromycin] Hives   Ceftin [Cefuroxime Axetil] Hives   Penicillins Hives    Has patient had a PCN reaction causing immediate rash, facial/tongue/throat swelling, SOB or lightheadedness with hypotension: Yes Has patient had a PCN reaction causing severe rash involving mucus membranes or skin necrosis: Yes Has patient had a PCN reaction that required hospitalization: No Has patient had a PCN reaction occurring within the last 10 years: No If all of the above answers are "NO", then may proceed with Cephalosporin use.    Tetracyclines & Related Hives    Review of Systems  As per HPI  PE:    11/09/2022    9:43 AM 08/13/2022    3:24 PM 07/23/2022    3:58 PM  Vitals with BMI  Weight 189 lbs 3 oz 186 lbs 186 lbs  Systolic 117 111 413  Diastolic 79 72 72  Pulse 81  75     Physical Exam  Gen: Alert, well appearing.  Patient is oriented to person, place, time, and situation. AFFECT: pleasant, lucid thought and speech. No further exam today  LABS:  Last CBC Lab Results  Component Value Date   WBC 8.2 04/30/2022   HGB 14.6 04/30/2022   HCT 41.1 04/30/2022   MCV 90.3 04/30/2022   MCH 32.1 04/30/2022   RDW 13.1 04/30/2022   PLT 262 04/30/2022   Last metabolic panel Lab Results  Component Value Date   GLUCOSE 88 04/30/2022   NA 140 04/30/2022   K 4.0 04/30/2022   CL 104 04/30/2022   CO2 27 04/30/2022   BUN 16 04/30/2022   CREATININE 1.00 04/30/2022   GFRNONAA >60 11/03/2020   CALCIUM 9.3 04/30/2022   PROT 7.5 05/07/2022   ALBUMIN 5.0 11/03/2020   BILITOT 0.3 04/30/2022   ALKPHOS 48 11/03/2020   AST 17 04/30/2022   ALT 40 04/30/2022   ANIONGAP 11 11/03/2020   Last thyroid functions Lab Results  Component Value Date   TSH 1.17 04/30/2022   IMPRESSION AND PLAN:  #1 generalized anxiety disorder, history of panic attacks. Recent panic attack the most  severe he is ever had. I do not think this is due to being on the Cymbalta. Continue Cymbalta at the 20 mg daily dose.  2.  Chronic bilateral hand pain, unclear etiology but possibly related to his fibromyalgia. This has responded very well to just 20 mg of Cymbalta daily. This medication is causing some daily nausea, though (1st 3 hours of every morning after waking up). He will switch the Cymbalta to a morning dosing time rather than evening to  see if this helps. Will do Zofran 4 mg, 1-2 every 8 hours as needed and hopefully we will also continue to see this side effect from Cymbalta dissipate over time.  An After Visit Summary was printed and given to the patient.  FOLLOW UP: Return in about 2 weeks (around 11/23/2022) for Follow-up anxiety, nausea, Cymbalta.  Signed:  Santiago Bumpers, MD           11/09/2022

## 2022-11-10 ENCOUNTER — Ambulatory Visit: Payer: Commercial Managed Care - HMO | Admitting: Family Medicine

## 2022-11-25 ENCOUNTER — Ambulatory Visit: Payer: Commercial Managed Care - HMO | Admitting: Family Medicine

## 2022-11-25 NOTE — Progress Notes (Deleted)
OFFICE VISIT  11/25/2022  CC: No chief complaint on file.   Patient is a 32 y.o. male who presents for 2-week follow-up anxiety, nausea. A/P as of last visit: "#1 generalized anxiety disorder, history of panic attacks. Recent panic attack the most severe he is ever had. I do not think this is due to being on the Cymbalta. Continue Cymbalta at the 20 mg daily dose.   2.  Chronic bilateral hand pain, unclear etiology but possibly related to his fibromyalgia. This has responded very well to just 20 mg of Cymbalta daily. This medication is causing some daily nausea, though (1st 3 hours of every morning after waking up). He will switch the Cymbalta to a morning dosing time rather than evening to see if this helps. Will do Zofran 4 mg, 1-2 every 8 hours as needed and hopefully we will also continue to see this side effect from Cymbalta dissipate over time."  INTERIM HX: ***  Past Medical History:  Diagnosis Date   Alcoholism in recovery (HCC)    Asthma    Bilateral hand pain    cymbalta VERY helpful   Bilateral headaches 01/27/2015   neuro->low serotonin level->cymbalta   Epiploic appendagitis    09/2020   Fibromyalgia    rheum eval 2024   GAD (generalized anxiety disorder)    GERD (gastroesophageal reflux disease)    hx esophagitis   Hearing loss    IBS (irritable bowel syndrome)    Laryngopharyngeal reflux (LPR)    POTS (postural orthostatic tachycardia syndrome) 02/13/2016   Restless legs syndrome    Seasonal allergies    Vertigo     Past Surgical History:  Procedure Laterality Date   CARDIOVASCULAR STRESS TEST     12/2015 myoview normal   ESOPHAGOGASTRODUODENOSCOPY     02/06/2020: Esophagitis, erosive gastro duodenopathy   INGUINAL HERNIA REPAIR     bilat as a teen   SINUS ENDO W/FUSION     20s   WISDOM TOOTH EXTRACTION      Outpatient Medications Prior to Visit  Medication Sig Dispense Refill   ALBUTEROL IN Inhale 1-2 puffs into the lungs as needed.      celecoxib (CELEBREX) 200 MG capsule Take 200 mg by mouth daily.     DULoxetine (CYMBALTA) 20 MG capsule Take 1 capsule (20 mg total) by mouth daily. 30 capsule 1   metoCLOPramide (REGLAN) 10 MG tablet 1 tab po bid with meals 60 tablet 1   omeprazole (PRILOSEC) 40 MG capsule 1 cap po bid 180 capsule 1   ondansetron (ZOFRAN) 4 MG tablet 1-2 tabs p.o. every 8 hours as needed for nausea 60 tablet 0   propranolol (INDERAL) 10 MG tablet Take 1 tablet (10 mg total) by mouth daily. 90 tablet 1   No facility-administered medications prior to visit.    Allergies  Allergen Reactions   Amoxicillin    Biaxin [Clarithromycin] Hives   Ceftin [Cefuroxime Axetil] Hives   Penicillins Hives    Has patient had a PCN reaction causing immediate rash, facial/tongue/throat swelling, SOB or lightheadedness with hypotension: Yes Has patient had a PCN reaction causing severe rash involving mucus membranes or skin necrosis: Yes Has patient had a PCN reaction that required hospitalization: No Has patient had a PCN reaction occurring within the last 10 years: No If all of the above answers are "NO", then may proceed with Cephalosporin use.    Tetracyclines & Related Hives    Review of Systems As per HPI  PE:  11/09/2022    9:43 AM 08/13/2022    3:24 PM 07/23/2022    3:58 PM  Vitals with BMI  Weight 189 lbs 3 oz 186 lbs 186 lbs  Systolic 117 111 865  Diastolic 79 72 72  Pulse 81  75     Physical Exam  ***  LABS:  Last CBC Lab Results  Component Value Date   WBC 8.2 04/30/2022   HGB 14.6 04/30/2022   HCT 41.1 04/30/2022   MCV 90.3 04/30/2022   MCH 32.1 04/30/2022   RDW 13.1 04/30/2022   PLT 262 04/30/2022   Last metabolic panel Lab Results  Component Value Date   GLUCOSE 88 04/30/2022   NA 140 04/30/2022   K 4.0 04/30/2022   CL 104 04/30/2022   CO2 27 04/30/2022   BUN 16 04/30/2022   CREATININE 1.00 04/30/2022   GFRNONAA >60 11/03/2020   CALCIUM 9.3 04/30/2022   PROT 7.5  05/07/2022   ALBUMIN 5.0 11/03/2020   BILITOT 0.3 04/30/2022   ALKPHOS 48 11/03/2020   AST 17 04/30/2022   ALT 40 04/30/2022   ANIONGAP 11 11/03/2020   IMPRESSION AND PLAN:  No problem-specific Assessment & Plan notes found for this encounter.   An After Visit Summary was printed and given to the patient.  FOLLOW UP: No follow-ups on file.  Signed:  Santiago Bumpers, MD           11/25/2022

## 2022-12-16 ENCOUNTER — Other Ambulatory Visit: Payer: Self-pay | Admitting: Family Medicine

## 2022-12-24 ENCOUNTER — Encounter: Payer: Self-pay | Admitting: Family Medicine

## 2023-02-01 ENCOUNTER — Other Ambulatory Visit: Payer: Self-pay | Admitting: Family Medicine

## 2023-02-01 ENCOUNTER — Telehealth: Payer: Self-pay | Admitting: Family Medicine

## 2023-02-01 MED ORDER — OMEPRAZOLE 40 MG PO CPDR: DELAYED_RELEASE_CAPSULE | ORAL | 1 refills | Status: DC

## 2023-02-01 NOTE — Telephone Encounter (Signed)
Refill sent.

## 2023-02-01 NOTE — Telephone Encounter (Signed)
Prescription Request  02/01/2023  LOV: 11/09/2022  What is the name of the medication or equipment?   omeprazole (PRILOSEC) 40 MG capsule    Have you contacted your pharmacy to request a refill? Yes   Which pharmacy would you like this sent to?  [id number] - [place], [place] - [address] [place] [address] Phone: [phone] Fax: [phone]    Patient notified that their request is being sent to the clinical staff for review and that they should receive a response within 2 business days.   Please advise at Mobile [phone] (mobile)

## 2023-02-04 ENCOUNTER — Ambulatory Visit: Payer: Managed Care, Other (non HMO) | Admitting: Family Medicine

## 2023-02-04 ENCOUNTER — Encounter: Payer: Self-pay | Admitting: Family Medicine

## 2023-02-04 VITALS — BP 111/71 | HR 83 | Wt 186.4 lb

## 2023-02-04 DIAGNOSIS — Z23 Encounter for immunization: Secondary | ICD-10-CM | POA: Diagnosis not present

## 2023-02-04 DIAGNOSIS — M79642 Pain in left hand: Secondary | ICD-10-CM

## 2023-02-04 DIAGNOSIS — M79641 Pain in right hand: Secondary | ICD-10-CM | POA: Diagnosis not present

## 2023-02-04 DIAGNOSIS — G44019 Episodic cluster headache, not intractable: Secondary | ICD-10-CM | POA: Diagnosis not present

## 2023-02-04 DIAGNOSIS — T50905A Adverse effect of unspecified drugs, medicaments and biological substances, initial encounter: Secondary | ICD-10-CM | POA: Diagnosis not present

## 2023-02-04 MED ORDER — MELOXICAM 15 MG PO TABS: ORAL_TABLET | ORAL | 5 refills | Status: DC

## 2023-02-04 MED ORDER — RIZATRIPTAN BENZOATE 5 MG PO TABS: 5.0000 mg | ORAL_TABLET | ORAL | 0 refills | Status: DC | PRN

## 2023-02-04 NOTE — Progress Notes (Signed)
OFFICE VISIT  02/04/2023  CC:  Chief Complaint  Patient presents with   Anxiety    Pt wants to discuss getting off of Cymbalta and wants to try Meloxicam. Pt also c/o of a headache that persists all day. Feels pain behind his eye.     Patient is a 32 y.o. male who presents for 56-month follow-up anxiety. A/P as of last visit: "#1 generalized anxiety disorder, history of panic attacks. Recent panic attack the most severe he is ever had. I do not think this is due to being on the Cymbalta. Continue Cymbalta at the 20 mg daily dose.   2.  Chronic bilateral hand pain, unclear etiology but possibly related to his fibromyalgia. This has responded very well to just 20 mg of Cymbalta daily. This medication is causing some daily nausea, though (1st 3 hours of every morning after waking up). He will switch the Cymbalta to a morning dosing time rather than evening to see if this helps. Will do Zofran 4 mg, 1-2 every 8 hours as needed and hopefully we will also continue to see this side effect from Cymbalta dissipate over time."  INTERIM HX: The nausea got too bad so he stopped the Cymbalta completely. He has taken a few of his mother's meloxicam 7.5 mg tabs and finds it helpful for the pain in his hands but does not last all day. He is not taking the Celebrex on his med list.  Has started developing left orbital headache, present daily for the last 5 days.  He cannot be sure about left eye drooping or not.  Describes kind of a hazy hue to his visual field but not double vision.  Mild intermittent disequilibrium but no vertigo or presyncope.  The pain is throbbing, not sharp or jabbing or burning.  Usually he wakes up with it and then towards the end of the day it subsides.  He has taken Goody's powder a couple of times and it does not help. Denies any history of this particular type of headache.  Review of systems: No fever, no nausea or vomiting, no focal weakness, no tremor,  Past Medical  History:  Diagnosis Date   Alcoholism in recovery (HCC)    Asthma    Bilateral hand pain    cymbalta VERY helpful   Bilateral headaches 01/27/2015   neuro->low serotonin level->cymbalta   Epiploic appendagitis    09/2020   Fibromyalgia    rheum eval 2024   GAD (generalized anxiety disorder)    GERD (gastroesophageal reflux disease)    hx esophagitis   Hearing loss    IBS (irritable bowel syndrome)    Laryngopharyngeal reflux (LPR)    POTS (postural orthostatic tachycardia syndrome) 02/13/2016   Restless legs syndrome    Seasonal allergies    Vertigo     Past Surgical History:  Procedure Laterality Date   CARDIOVASCULAR STRESS TEST     12/2015 myoview normal   ESOPHAGOGASTRODUODENOSCOPY     02/06/2020: Esophagitis, erosive gastro duodenopathy   INGUINAL HERNIA REPAIR     bilat as a teen   SINUS ENDO W/FUSION     20s   WISDOM TOOTH EXTRACTION      Outpatient Medications Prior to Visit  Medication Sig Dispense Refill   ALBUTEROL IN Inhale 1-2 puffs into the lungs as needed.     metoCLOPramide (REGLAN) 10 MG tablet 1 tab po bid with meals 60 tablet 1   omeprazole (PRILOSEC) 40 MG capsule 1 cap po bid 180 capsule  1   propranolol (INDERAL) 10 MG tablet Take 1 tablet (10 mg total) by mouth daily. 90 tablet 1   celecoxib (CELEBREX) 200 MG capsule Take 200 mg by mouth daily.     DULoxetine (CYMBALTA) 20 MG capsule Take 1 capsule (20 mg total) by mouth daily. (Patient not taking: Reported on 02/04/2023) 30 capsule 1   ondansetron (ZOFRAN) 4 MG tablet 1-2 tabs p.o. every 8 hours as needed for nausea (Patient not taking: Reported on 02/04/2023) 60 tablet 0   No facility-administered medications prior to visit.    Allergies  Allergen Reactions   Amoxicillin    Biaxin [Clarithromycin] Hives   Ceftin [Cefuroxime Axetil] Hives   Penicillins Hives    Has patient had a PCN reaction causing immediate rash, facial/tongue/throat swelling, SOB or lightheadedness with hypotension:  Yes Has patient had a PCN reaction causing severe rash involving mucus membranes or skin necrosis: Yes Has patient had a PCN reaction that required hospitalization: No Has patient had a PCN reaction occurring within the last 10 years: No If all of the above answers are "NO", then may proceed with Cephalosporin use.    Tetracyclines & Related Hives    Review of Systems As per HPI  PE:    02/04/2023    3:24 PM 11/09/2022    9:43 AM 08/13/2022    3:24 PM  Vitals with BMI  Weight 186 lbs 6 oz 189 lbs 3 oz 186 lbs  Systolic 111 117 644  Diastolic 71 79 72  Pulse 83 81      Physical Exam  Gen: Alert, well appearing.  Patient is oriented to person, place, time, and situation. AFFECT: pleasant, lucid thought and speech. No further exam today.  LABS:  Last CBC Lab Results  Component Value Date   WBC 8.2 04/30/2022   HGB 14.6 04/30/2022   HCT 41.1 04/30/2022   MCV 90.3 04/30/2022   MCH 32.1 04/30/2022   RDW 13.1 04/30/2022   PLT 262 04/30/2022   Last metabolic panel Lab Results  Component Value Date   GLUCOSE 88 04/30/2022   NA 140 04/30/2022   K 4.0 04/30/2022   CL 104 04/30/2022   CO2 27 04/30/2022   BUN 16 04/30/2022   CREATININE 1.00 04/30/2022   GFRNONAA >60 11/03/2020   CALCIUM 9.3 04/30/2022   PROT 7.5 05/07/2022   ALBUMIN 5.0 11/03/2020   BILITOT 0.3 04/30/2022   ALKPHOS 48 11/03/2020   AST 17 04/30/2022   ALT 40 04/30/2022   ANIONGAP 11 11/03/2020   Last thyroid functions Lab Results  Component Value Date   TSH 1.17 04/30/2022   IMPRESSION AND PLAN:  #1 medication side effect. Cymbalta caused excessive nausea.  Discontinued.  2.  Chronic bilateral hand pain.  Unknown etiology.  This responded well to Cymbalta but unfortunately he cannot tolerate this medication. He finds meloxicam 7.5 mg helpful.  Will have him take a half of a 15 mg meloxicam tab twice a day.  3.  Left orbital headache, consistent with cluster headache. Will do trial of Maxalt  5 mg.  An After Visit Summary was printed and given to the patient.  FOLLOW UP: Return in about 3 months (around 05/07/2023) for routine chronic illness f/u.  Signed:  Santiago Bumpers, MD           02/04/2023

## 2023-02-04 NOTE — Patient Instructions (Signed)
Take 1/2 of your 15mg  meloxicam tab twice per day

## 2023-02-23 ENCOUNTER — Ambulatory Visit (INDEPENDENT_AMBULATORY_CARE_PROVIDER_SITE_OTHER): Payer: Commercial Managed Care - HMO | Admitting: Family Medicine

## 2023-02-23 ENCOUNTER — Encounter: Payer: Self-pay | Admitting: Family Medicine

## 2023-02-23 VITALS — BP 117/75 | HR 74 | Temp 97.9°F | Wt 188.0 lb

## 2023-02-23 DIAGNOSIS — R0981 Nasal congestion: Secondary | ICD-10-CM

## 2023-02-23 DIAGNOSIS — J069 Acute upper respiratory infection, unspecified: Secondary | ICD-10-CM | POA: Diagnosis not present

## 2023-02-23 LAB — POCT INFLUENZA A/B
Influenza A, POC: NEGATIVE
Influenza B, POC: NEGATIVE

## 2023-02-23 LAB — POC COVID19 BINAXNOW: SARS Coronavirus 2 Ag: NEGATIVE

## 2023-02-23 NOTE — Progress Notes (Signed)
OFFICE VISIT  02/23/2023  CC:  Chief Complaint  Patient presents with   Nasal Congestion    Started yesterday; cough, chest congestion, drainage.  Denies fever. Pt has been taking Dayquil for symptoms. Pt was around nephew who has pneumonia.     Patient is a 32 y.o. male who presents accompanied by his wife for cough.  HPI: Has had about 24 hours of nasal congestion, postnasal drip, and congested cough. No shortness of breath, wheezing, or fever. He does have headache.  No sore throat. Took DayQuil earlier today.  Past Medical History:  Diagnosis Date   Alcoholism in recovery [facility])    Asthma    Bilateral hand pain    cymbalta VERY helpful   Bilateral headaches 01/27/2015   neuro->low serotonin level->cymbalta   Epiploic appendagitis    09/2020   Fibromyalgia    rheum eval 2024   GAD (generalized anxiety disorder)    GERD (gastroesophageal reflux disease)    hx esophagitis   Hearing loss    IBS (irritable bowel syndrome)    Laryngopharyngeal reflux (LPR)    POTS (postural orthostatic tachycardia syndrome) 02/13/2016   Restless legs syndrome    Seasonal allergies    Vertigo     Past Surgical History:  Procedure Laterality Date   CARDIOVASCULAR STRESS TEST     12/2015 myoview normal   ESOPHAGOGASTRODUODENOSCOPY     02/06/2020: Esophagitis, erosive gastro duodenopathy   INGUINAL HERNIA REPAIR     bilat as a teen   SINUS ENDO W/FUSION     20s   WISDOM TOOTH EXTRACTION      Outpatient Medications Prior to Visit  Medication Sig Dispense Refill   ALBUTEROL IN Inhale 1-2 puffs into the lungs as needed.     meloxicam (MOBIC) 15 MG tablet 1 tab po qd 30 tablet 5   metoCLOPramide (REGLAN) 10 MG tablet 1 tab po bid with meals 60 tablet 1   omeprazole (PRILOSEC) 40 MG capsule 1 cap po bid 180 capsule 1   propranolol (INDERAL) 10 MG tablet Take 1 tablet (10 mg total) by mouth daily. 90 tablet 1   rizatriptan (MAXALT) 5 MG tablet Take 1 tablet (5 mg total) by mouth as  needed for migraine. May repeat in 2 hours if needed 10 tablet 0   No facility-administered medications prior to visit.    Allergies  Allergen Reactions   Amoxicillin    Cymbalta [Duloxetine Hcl] Nausea Only   Biaxin [Clarithromycin] Hives   Ceftin [Cefuroxime Axetil] Hives   Penicillins Hives    Has patient had a PCN reaction causing immediate rash, facial/tongue/throat swelling, SOB or lightheadedness with hypotension: Yes Has patient had a PCN reaction causing severe rash involving mucus membranes or skin necrosis: Yes Has patient had a PCN reaction that required hospitalization: No Has patient had a PCN reaction occurring within the last 10 years: No If all of the above answers are "NO", then may proceed with Cephalosporin use.    Tetracyclines & Related Hives    Review of Systems  As per HPI  PE:    02/23/2023   11:18 AM 02/04/2023    3:24 PM 11/09/2022    9:43 AM  Vitals with BMI  Weight 188 lbs 186 lbs 6 oz 189 lbs 3 oz  Systolic 117 111 086  Diastolic 75 71 79  Pulse 74 83 81    Physical Exam  VS: noted--normal. Gen: alert, NAD, NONTOXIC APPEARING. HEENT: eyes without injection, drainage, or swelling.  Ears:  EACs clear, TMs with normal light reflex and landmarks.  Nose: Clear rhinorrhea, with some dried, crusty exudate adherent to mildly injected mucosa.  No purulent d/c.  No paranasal sinus TTP.  No facial swelling.  Throat and mouth without focal lesion.  No pharyngial swelling, erythema, or exudate.   Neck: supple, no LAD.   LUNGS: CTA bilat, nonlabored resps.   CV: RRR, no m/r/g. EXT: no c/c/e SKIN: no rash  LABS:  none  IMPRESSION AND PLAN:  URI with cough and congestion. Suspect viral etiology. Covid and flu testing here today NEGATIVE. Use a cool mist humidifier in your room. Use saline nasal spray several times a day to clear your nasal passages. Buy over the counter mucinex dm and take every 12 hours AS NEEDED for cough/congestion.  An After  Visit Summary was printed and given to the patient.  FOLLOW UP: Return if symptoms worsen or fail to improve.  Signed:  Santiago Bumpers, MD           02/23/2023

## 2023-02-23 NOTE — Patient Instructions (Signed)
Use a cool mist humidifier in your room. Use saline nasal spray several times a day to clear your nasal passages. Buy over the counter mucinex dm and take every 12 hours AS NEEDED for cough/congestion.

## 2023-03-01 ENCOUNTER — Telehealth: Payer: Self-pay

## 2023-03-01 MED ORDER — RIZATRIPTAN BENZOATE 5 MG PO TABS: 5.0000 mg | ORAL_TABLET | ORAL | 1 refills | Status: DC | PRN

## 2023-03-01 NOTE — Telephone Encounter (Signed)
Refill sent.

## 2023-03-01 NOTE — Telephone Encounter (Signed)
Patient stated medication is working for migraines. Requesting 30d/s.   Prescription Request  03/01/2023  LOV: Visit date not found  What is the name of the medication or equipment? rizatriptan (MAXALT) 5 MG tablet   Have you contacted your pharmacy to request a refill? Yes   Which pharmacy would you like this sent to?  [id number] - [place], [place] - [address] [place] [address] Phone: [phone] Fax: [phone]    Patient notified that their request is being sent to the clinical staff for review and that they should receive a response within 2 business days.   Please advise at Mobile [phone] (mobile)

## 2023-03-14 ENCOUNTER — Ambulatory Visit
Admission: RE | Admit: 2023-03-14 | Discharge: 2023-03-14 | Disposition: A | Discharge status: Home / Self Care | Payer: Self-pay | Source: Ambulatory Visit | Attending: Nurse Practitioner | Admitting: Nurse Practitioner

## 2023-03-14 VITALS — BP 116/78 | HR 71 | Temp 97.9°F | Resp 16

## 2023-03-14 DIAGNOSIS — H66002 Acute suppurative otitis media without spontaneous rupture of ear drum, left ear: Secondary | ICD-10-CM | POA: Diagnosis not present

## 2023-03-14 DIAGNOSIS — J028 Acute pharyngitis due to other specified organisms: Secondary | ICD-10-CM | POA: Diagnosis not present

## 2023-03-14 LAB — POCT RAPID STREP A (OFFICE): Rapid Strep A Screen: NEGATIVE

## 2023-03-14 MED ORDER — PREDNISONE 10 MG PO TABS: 20.0000 mg | ORAL_TABLET | Freq: Every day | ORAL | 0 refills | Status: AC

## 2023-03-14 MED ORDER — AZITHROMYCIN 250 MG PO TABS: ORAL_TABLET | ORAL | 0 refills | Status: DC

## 2023-03-14 NOTE — Discharge Instructions (Addendum)
Left middle ear infection and pharyngitis but strep is negative. Can treat this with the following:  Prednisone 40mg  daily for 5 days, take in the morning and with food. Azithromycin 250mg  take 2 tablets today, then 1 tablet daily for 4 days Rest and hydrate Return to urgent care or PCP if symptoms worsen or fail to resolve.

## 2023-03-14 NOTE — ED Provider Notes (Signed)
[facility]    CSN: [id number] Arrival date & time: 03/14/23  1551      History   Chief Complaint Chief Complaint  Patient presents with   Cough    Entered by patient    HPI Dustin [name] is a 32 y.o. male.   32 year old male who presents to urgent care with complaints of sore throat on the left side, left ear pain and nasal congestion with drainage.  This has been ongoing for about 2 days.  He denies any fevers or chills.  He reports just not feeling good in general.  He relates that his family member was diagnosed with strep recently.  He is eating and drinking well.  Denies chest pain, shortness of breath, cough, abdominal symptoms or urinary symptoms.     Cough Associated symptoms: ear pain and sore throat   Associated symptoms: no chest pain, no chills, no fever, no rash and no shortness of breath     Past Medical History:  Diagnosis Date   Alcoholism in recovery (HCC)    Asthma    Bilateral hand pain    cymbalta VERY helpful   Bilateral headaches 01/27/2015   neuro->low serotonin level->cymbalta   Epiploic appendagitis    09/2020   Fibromyalgia    rheum eval 2024   GAD (generalized anxiety disorder)    GERD (gastroesophageal reflux disease)    hx esophagitis   Hearing loss    IBS (irritable bowel syndrome)    Laryngopharyngeal reflux (LPR)    POTS (postural orthostatic tachycardia syndrome) 02/13/2016   Restless legs syndrome    Seasonal allergies    Vertigo     Patient Active Problem List   Diagnosis Date Noted   Gastroesophageal reflux disease 12/27/2019   Dysphagia 12/27/2019   Generalized abdominal pain 12/27/2019   Pyrosis 12/27/2019   Diarrhea 02/26/2019   Irritability 12/05/2018   Allergic rhinitis 12/05/2018   POTS (postural orthostatic tachycardia syndrome) 02/13/2016   Epistaxis 10/28/2015   Bilateral headaches 01/27/2015    Past Surgical History:  Procedure Laterality Date   CARDIOVASCULAR STRESS TEST     12/2015 myoview  normal   ESOPHAGOGASTRODUODENOSCOPY     02/06/2020: Esophagitis, erosive gastro duodenopathy   INGUINAL HERNIA REPAIR     bilat as a teen   SINUS ENDO W/FUSION     20s   WISDOM TOOTH EXTRACTION         Home Medications    Prior to Admission medications   Medication Sig Start Date End Date Taking? Authorizing Provider  meloxicam (MOBIC) 15 MG tablet 1 tab po qd 02/04/23  Yes McGowen, Maryjean Morn, MD  omeprazole (PRILOSEC) 40 MG capsule 1 cap po bid 02/01/23  Yes McGowen, Maryjean Morn, MD  ALBUTEROL IN Inhale 1-2 puffs into the lungs as needed.    [provider]  metoCLOPramide (REGLAN) 10 MG tablet 1 tab po bid with meals 08/13/22   McGowen, Maryjean Morn, MD  propranolol (INDERAL) 10 MG tablet Take 1 tablet (10 mg total) by mouth daily. 04/30/22   McGowen, Maryjean Morn, MD  rizatriptan (MAXALT) 5 MG tablet Take 1 tablet (5 mg total) by mouth as needed for migraine. May repeat in 2 hours if needed 03/01/23   McGowen, Maryjean Morn, MD    Family History Family History  Problem Relation Age of Onset   Migraines Mother    Arthritis Mother    Asthma Mother    Depression Mother    Hypertension Mother  Esophageal cancer Father 33   Early death Father    Arthritis Maternal Grandmother    Cancer Maternal Grandmother    Lung cancer Paternal Grandfather     Social History Social History   Tobacco Use   Smoking status: Former    Current packs/day: 0.00    Average packs/day: 0.5 packs/day for 15.0 years (7.5 ttl pk-yrs)    Types: Cigarettes    Start date: 2008    Quit date: 04/2021    Years since quitting: 1.9   Smokeless tobacco: Never   Tobacco comments:    Still vapes occasionally  Vaping Use   Vaping status: Former   Substances: Nicotine, Flavoring  Substance Use Topics   Alcohol use: Not Currently    Alcohol/week: 0.0 standard drinks of alcohol   Drug use: No     Allergies   Amoxicillin, Cymbalta [duloxetine hcl], Biaxin [clarithromycin], Ceftin [cefuroxime axetil],  Penicillins, and Tetracyclines & related   Review of Systems Review of Systems  Constitutional:  Negative for chills and fever.  HENT:  Positive for congestion, ear pain and sore throat.   Eyes:  Negative for pain and visual disturbance.  Respiratory:  Negative for cough and shortness of breath.   Cardiovascular:  Negative for chest pain and palpitations.  Gastrointestinal:  Negative for abdominal pain and vomiting.  Genitourinary:  Negative for dysuria and hematuria.  Musculoskeletal:  Negative for arthralgias and back pain.  Skin:  Negative for color change and rash.  Neurological:  Negative for seizures and syncope.  All other systems reviewed and are negative.    Physical Exam Triage Vital Signs ED Triage Vitals  Encounter Vitals Group     BP 03/14/23 1617 116/78     Systolic BP Percentile --      Diastolic BP Percentile --      Pulse Rate 03/14/23 1617 71     Resp 03/14/23 1617 16     Temp 03/14/23 1617 97.9 F (36.6 C)     Temp Source 03/14/23 1617 Oral     SpO2 03/14/23 1617 95 %     Weight --      Height --      Head Circumference --      Peak Flow --      Pain Score 03/14/23 1613 5     Pain Loc --      Pain Education --      Exclude from Growth Chart --    No data found.  Updated Vital Signs BP 116/78 (BP Location: Right Arm)   Pulse 71   Temp 97.9 F (36.6 C) (Oral)   Resp 16   SpO2 95%   Visual Acuity Right Eye Distance:   Left Eye Distance:   Bilateral Distance:    Right Eye Near:   Left Eye Near:    Bilateral Near:     Physical Exam Vitals and nursing note reviewed.  Constitutional:      General: He is not in acute distress.    Appearance: He is well-developed.  HENT:     Head: Normocephalic and atraumatic.     Left Ear: Hearing normal. A middle ear effusion is present. Tympanic membrane is injected.     Ears:   Eyes:     Conjunctiva/sclera: Conjunctivae normal.  Cardiovascular:     Rate and Rhythm: Normal rate and regular rhythm.      Heart sounds: No murmur heard. Pulmonary:     Effort: Pulmonary effort is normal. No respiratory  distress.     Breath sounds: Normal breath sounds.  Abdominal:     Palpations: Abdomen is soft.     Tenderness: There is no abdominal tenderness.  Musculoskeletal:        General: No swelling.     Cervical back: Neck supple.  Skin:    General: Skin is warm and dry.     Capillary Refill: Capillary refill takes less than 2 seconds.  Neurological:     Mental Status: He is alert.  Psychiatric:        Mood and Affect: Mood normal.      UC Treatments / Results  Labs (all labs ordered are listed, but only abnormal results are displayed) Labs Reviewed  POCT RAPID STREP A (OFFICE)    EKG   Radiology No results found.  Procedures Procedures (including critical care time)  Medications Ordered in UC Medications - No data to display  Initial Impression / Assessment and Plan / UC Course  I have reviewed the triage vital signs and the nursing notes.  Pertinent labs & imaging results that were available during my care of the patient were reviewed by me and considered in my medical decision making (see chart for details).     Non-recurrent acute suppurative otitis media of left ear without spontaneous rupture of tympanic membrane  Acute pharyngitis due to other specified organisms  Left otitis media and pharyngitis but strep is negative. Can treat this with the following:  Prednisone 40mg  daily for 5 days, take in the morning and with food. Azithromycin 250mg  take 2 tablets today, then 1 tablet daily for 4 days Rest and hydrate Return to urgent care or PCP if symptoms worsen or fail to resolve.    Final Clinical Impressions(s) / UC Diagnoses   Final diagnoses:  None   Discharge Instructions   None    ED Prescriptions   None    PDMP not reviewed this encounter.   Landis Martins, [place] 03/14/23 1642

## 2023-03-14 NOTE — ED Triage Notes (Signed)
Pt presents to UC for c/o sore throat, nasal drainage, left ear pain x2 days. Daughter had strep A Taken dayquil

## 2023-03-16 ENCOUNTER — Telehealth: Payer: Self-pay | Admitting: Family Medicine

## 2023-03-16 NOTE — Telephone Encounter (Signed)
Patient is requesting additional quantity for  rizatriptan (MAXALT) 5 MG tablet   He states that he has been taking one every other day to preserve them since he only has 10. He is requesting to have at least 15 pills.  Pharmacy is [facility] in [place]

## 2023-03-16 NOTE — Telephone Encounter (Signed)
Dustin [name] was here for a visit 02/23/23 and no insurance was attached to that visit, however he does have insurance.  Can this be sent through insurance again and  [name] be sent an updated bill?

## 2023-03-17 NOTE — Telephone Encounter (Signed)
Unfortunately it will not allow me to add it to the date of service 11/20/ It is grayed out.

## 2023-03-22 ENCOUNTER — Ambulatory Visit: Payer: Self-pay

## 2023-03-22 NOTE — Telephone Encounter (Signed)
Insurance has been added to [employer]

## 2023-03-23 ENCOUNTER — Telehealth: Payer: Self-pay

## 2023-03-23 MED ORDER — RIZATRIPTAN BENZOATE 5 MG PO TABS: 5.0000 mg | ORAL_TABLET | ORAL | 1 refills | Status: DC | PRN

## 2023-03-28 ENCOUNTER — Other Ambulatory Visit: Payer: Self-pay | Admitting: Family Medicine

## 2023-03-28 ENCOUNTER — Ambulatory Visit: Payer: Commercial Managed Care - HMO | Admitting: Family Medicine

## 2023-03-28 ENCOUNTER — Encounter: Payer: Self-pay | Admitting: Family Medicine

## 2023-03-28 VITALS — BP 115/63 | HR 69 | Wt 188.0 lb

## 2023-03-28 DIAGNOSIS — G44021 Chronic cluster headache, intractable: Secondary | ICD-10-CM

## 2023-03-28 MED ORDER — PROPRANOLOL HCL ER BEADS 80 MG PO CP24: 80.0000 mg | ORAL_CAPSULE | Freq: Every day | ORAL | 0 refills | Status: DC

## 2023-03-28 MED ORDER — RIZATRIPTAN BENZOATE 5 MG PO TABS: 5.0000 mg | ORAL_TABLET | ORAL | 1 refills | Status: DC | PRN

## 2023-03-28 NOTE — Progress Notes (Signed)
OFFICE VISIT  03/28/2023  CC:  Chief Complaint  Patient presents with   Headache    Still c/o of HA/migraines. Pt also mentions having moments of dizziness. Maxalt is working well for pt.     Patient is a 32 y.o. male who presents for headaches.  HPI: Still having headaches almost every day, left orbital region extending over the ear into the parietal and occipital region on that side.  Occasionally a generalized headache. No nausea but he does have photophobia.  In fact, he wakes up and as long as is dare he is fine.  When he starts getting into the light he starts getting the headache.  When he gets home from work he will go into a dark room and the headache will go away eventually.  He cannot identify any other triggers.  No alcohol.  No dietary changes. He is taking rizatriptan 5 mg and this works well but the headache returns the next day, sometimes even the same day. He has some dizziness with the headaches sometimes.  Review of systems: Chronic widespread musculoskeletal pain.  Brain fog. Chronic bilateral hand pain.  Past Medical History:  Diagnosis Date   Alcoholism in recovery [facility])    Asthma    Bilateral hand pain    cymbalta VERY helpful   Bilateral headaches 01/27/2015   neuro->low serotonin level->cymbalta   Epiploic appendagitis    09/2020   Fibromyalgia    rheum eval 2024   GAD (generalized anxiety disorder)    GERD (gastroesophageal reflux disease)    hx esophagitis   Hearing loss    IBS (irritable bowel syndrome)    Laryngopharyngeal reflux (LPR)    POTS (postural orthostatic tachycardia syndrome) 02/13/2016   Restless legs syndrome    Seasonal allergies    Vertigo     Past Surgical History:  Procedure Laterality Date   CARDIOVASCULAR STRESS TEST     12/2015 myoview normal   ESOPHAGOGASTRODUODENOSCOPY     02/06/2020: Esophagitis, erosive gastro duodenopathy   INGUINAL HERNIA REPAIR     bilat as a teen   SINUS ENDO W/FUSION     20s   WISDOM TOOTH  EXTRACTION      Outpatient Medications Prior to Visit  Medication Sig Dispense Refill   ALBUTEROL IN Inhale 1-2 puffs into the lungs as needed.     meloxicam (MOBIC) 15 MG tablet 1 tab po qd 30 tablet 5   metoCLOPramide (REGLAN) 10 MG tablet 1 tab po bid with meals 60 tablet 1   omeprazole (PRILOSEC) 40 MG capsule 1 cap po bid 180 capsule 1   propranolol (INDERAL) 10 MG tablet Take 1 tablet (10 mg total) by mouth daily. 90 tablet 1   rizatriptan (MAXALT) 5 MG tablet Take 1 tablet (5 mg total) by mouth as needed for migraine. May repeat in 2 hours if needed 10 tablet 1   azithromycin (ZITHROMAX) 250 MG tablet Take first 2 tablets together, then 1 every day until finished. 6 tablet 0   No facility-administered medications prior to visit.    Allergies  Allergen Reactions   Amoxicillin    Cymbalta [Duloxetine Hcl] Nausea Only   Biaxin [Clarithromycin] Hives   Ceftin [Cefuroxime Axetil] Hives   Penicillins Hives    Has patient had a PCN reaction causing immediate rash, facial/tongue/throat swelling, SOB or lightheadedness with hypotension: Yes Has patient had a PCN reaction causing severe rash involving mucus membranes or skin necrosis: Yes Has patient had a PCN reaction that required hospitalization:  No Has patient had a PCN reaction occurring within the last 10 years: No If all of the above answers are "NO", then may proceed with Cephalosporin use.    Tetracyclines & Related Hives    Review of Systems  As per HPI  PE:    03/28/2023    2:52 PM 03/14/2023    4:17 PM 02/23/2023   11:18 AM  Vitals with BMI  Weight 188 lbs  188 lbs  Systolic 115 116 329  Diastolic 63 78 75  Pulse 69 71 74     Physical Exam  Gen: Alert, well appearing.  Patient is oriented to person, place, time, and situation. AFFECT: pleasant, lucid thought and speech. CV: RRR, no m/r/g.   LUNGS: CTA bilat, nonlabored resps, good aeration in all lung fields. Neuro: CN 2-12 intact bilaterally, strength  5/5 in proximal and distal upper extremities and lower extremities bilaterally.    No tremor.  No ataxia.  U   LABS:  Last metabolic panel Lab Results  Component Value Date   GLUCOSE 88 04/30/2022   NA 140 04/30/2022   K 4.0 04/30/2022   CL 104 04/30/2022   CO2 27 04/30/2022   BUN 16 04/30/2022   CREATININE 1.00 04/30/2022   GFRNONAA >60 11/03/2020   CALCIUM 9.3 04/30/2022   PROT 7.5 05/07/2022   ALBUMIN 5.0 11/03/2020   BILITOT 0.3 04/30/2022   ALKPHOS 48 11/03/2020   AST 17 04/30/2022   ALT 40 04/30/2022   ANIONGAP 11 11/03/2020   IMPRESSION AND PLAN:  Chronic cluster headache syndrome. Discontinue the propranolol 10 mg a day (he states he was put on this for his POTS). Start InnoPran XL 80 mg nightly. Okay to continue rizatriptan 5 mg daily as needed but emphasized the need to limit this medicine is much as possible to avoid rebound headaches/medication overuse headaches.  An After Visit Summary was printed and given to the patient.  FOLLOW UP: Return for 3-4 wks f/u HAs.  Signed:  Santiago Bumpers, MD           03/28/2023

## 2023-03-31 ENCOUNTER — Ambulatory Visit
Admission: RE | Admit: 2023-03-31 | Discharge: 2023-03-31 | Disposition: A | Discharge status: Home / Self Care | Payer: Commercial Managed Care - HMO | Source: Ambulatory Visit | Attending: Family Medicine | Admitting: Family Medicine

## 2023-03-31 ENCOUNTER — Ambulatory Visit (HOSPITAL_BASED_OUTPATIENT_CLINIC_OR_DEPARTMENT_OTHER)
Admission: RE | Admit: 2023-03-31 | Discharge: 2023-03-31 | Disposition: A | Discharge status: Home / Self Care | Payer: Commercial Managed Care - HMO | Source: Ambulatory Visit | Attending: Urgent Care | Admitting: Urgent Care

## 2023-03-31 VITALS — BP 114/72 | HR 87 | Temp 97.9°F | Resp 18

## 2023-03-31 DIAGNOSIS — J209 Acute bronchitis, unspecified: Secondary | ICD-10-CM | POA: Diagnosis present

## 2023-03-31 MED ORDER — METHYLPREDNISOLONE ACETATE 80 MG/ML IJ SUSP
80.0000 mg | Freq: Once | INTRAMUSCULAR | Status: AC
  Administered 2023-03-31: 80 mg via INTRAMUSCULAR

## 2023-03-31 NOTE — ED Triage Notes (Signed)
Pt reports cough x 2 days. Reports he finished antibiotic for cough 1 week ago and felt better.

## 2023-03-31 NOTE — Discharge Instructions (Addendum)
Please go ahead and start using your albuterol. You've received an injection of a steroid. Will let you know about your x-ray results later today.

## 2023-03-31 NOTE — ED Provider Notes (Signed)
[facility]  Note:  This document was prepared using Conservation officer, historic buildings and may include unintentional dictation errors.  MRN: [id number] DOB: 1991/02/10  Subjective:   Dustin [name] is a 32 y.o. male presenting for 2 day history of recurrent cough with bilateral chest burning on lateral sides. Was seen 03/14/2023, started on azithromycin and prednisone. Had intermittent improvement but not resolution. Has been using his albuterol but wanted to get rechecked. No smoking of any kind including cigarettes, cigars, vaping, marijuana use.    No current facility-administered medications for this encounter.  Current Outpatient Medications:    ALBUTEROL IN, Inhale 1-2 puffs into the lungs as needed., Disp: , Rfl:    meloxicam (MOBIC) 15 MG tablet, 1 tab po qd, Disp: 30 tablet, Rfl: 5   metoCLOPramide (REGLAN) 10 MG tablet, 1 tab po bid with meals, Disp: 60 tablet, Rfl: 1   omeprazole (PRILOSEC) 40 MG capsule, 1 cap po bid, Disp: 180 capsule, Rfl: 1   propranolol (INNOPRAN XL) 80 MG 24 hr capsule, Take 1 capsule (80 mg total) by mouth at bedtime., Disp: 30 capsule, Rfl: 0   rizatriptan (MAXALT) 5 MG tablet, Take 1 tablet (5 mg total) by mouth as needed for migraine. May repeat in 2 hours if needed, Disp: 10 tablet, Rfl: 1   Allergies  Allergen Reactions   Amoxicillin    Cymbalta [Duloxetine Hcl] Nausea Only   Biaxin [Clarithromycin] Hives   Ceftin [Cefuroxime Axetil] Hives   Penicillins Hives    Has patient had a PCN reaction causing immediate rash, facial/tongue/throat swelling, SOB or lightheadedness with hypotension: Yes Has patient had a PCN reaction causing severe rash involving mucus membranes or skin necrosis: Yes Has patient had a PCN reaction that required hospitalization: No Has patient had a PCN reaction occurring within the last 10 years: No If all of the above answers are "NO", then may proceed with Cephalosporin use.    Tetracyclines &  Related Hives    Past Medical History:  Diagnosis Date   Alcoholism in recovery (HCC)    Asthma    Bilateral hand pain    cymbalta VERY helpful   Bilateral headaches 01/27/2015   neuro->low serotonin level->cymbalta   Epiploic appendagitis    09/2020   Fibromyalgia    rheum eval 2024   GAD (generalized anxiety disorder)    GERD (gastroesophageal reflux disease)    hx esophagitis   Hearing loss    IBS (irritable bowel syndrome)    Laryngopharyngeal reflux (LPR)    POTS (postural orthostatic tachycardia syndrome) 02/13/2016   Restless legs syndrome    Seasonal allergies    Vertigo      Past Surgical History:  Procedure Laterality Date   CARDIOVASCULAR STRESS TEST     12/2015 myoview normal   ESOPHAGOGASTRODUODENOSCOPY     02/06/2020: Esophagitis, erosive gastro duodenopathy   INGUINAL HERNIA REPAIR     bilat as a teen   SINUS ENDO W/FUSION     20s   WISDOM TOOTH EXTRACTION      Family History  Problem Relation Age of Onset   Migraines Mother    Arthritis Mother    Asthma Mother    Depression Mother    Hypertension Mother    Esophageal cancer Father 36   Early death Father    Arthritis Maternal Grandmother    Cancer Maternal Grandmother    Lung cancer Paternal Grandfather     Social History   Tobacco Use  Smoking status: Former    Current packs/day: 0.00    Average packs/day: 0.5 packs/day for 15.0 years (7.5 ttl pk-yrs)    Types: Cigarettes    Start date: 2008    Quit date: 04/2021    Years since quitting: 1.9   Smokeless tobacco: Never   Tobacco comments:    Still vapes occasionally  Vaping Use   Vaping status: Former   Substances: Nicotine, Flavoring  Substance Use Topics   Alcohol use: Not Currently    Alcohol/week: 0.0 standard drinks of alcohol   Drug use: No    ROS   Objective:   Vitals: BP 114/72 (BP Location: Right Arm)   Pulse 87   Temp 97.9 F (36.6 C) (Oral)   Resp 18   SpO2 95%   Physical Exam Constitutional:       General: He is not in acute distress.    Appearance: Normal appearance. He is well-developed. He is not ill-appearing, toxic-appearing or diaphoretic.  HENT:     Head: Normocephalic and atraumatic.     Right Ear: External ear normal.     Left Ear: External ear normal.     Nose: Nose normal.     Mouth/Throat:     Mouth: Mucous membranes are moist.  Eyes:     General: No scleral icterus.       Right eye: No discharge.        Left eye: No discharge.     Extraocular Movements: Extraocular movements intact.  Cardiovascular:     Rate and Rhythm: Normal rate and regular rhythm.     Heart sounds: Normal heart sounds. No murmur heard.    No friction rub. No gallop.  Pulmonary:     Effort: Pulmonary effort is normal. No respiratory distress.     Breath sounds: No stridor. No wheezing, rhonchi or rales.  Neurological:     Mental Status: He is alert and oriented to person, place, and time.  Psychiatric:        Mood and Affect: Mood normal.        Behavior: Behavior normal.        Thought Content: Thought content normal.    IM Depomedrol 80mg  administered in clinic.   Assessment and Plan :   PDMP not reviewed this encounter.  1. Acute bronchitis, unspecified organism    Will pursue outpatient imaging, x-ray order placed. Recommend avoiding antibiotics pending radiology over-read given history of significant medication reactions. IM steroids as above for his history of asthmatic bronchitis and current symptoms. Counseled patient on potential for adverse effects with medications prescribed/recommended today, ER and return-to-clinic precautions discussed, patient verbalized understanding.    Wallis Bamberg, [place] 03/31/23 1610

## 2023-04-01 ENCOUNTER — Other Ambulatory Visit (HOSPITAL_COMMUNITY): Payer: Self-pay

## 2023-04-01 NOTE — Telephone Encounter (Signed)
Pharmacy Patient Advocate Encounter   Received notification from RX Request Messages that prior authorization for Propranolol ER 80mg  cap is required/requested.   Insurance verification completed.   The patient is insured through [employer] .   Per test claim: The current 90 day co-pay is, $20.24.  No PA needed at this time. This test claim was processed through [facility]- copay amounts may vary at other pharmacies due to pharmacy/plan contracts, or as the patient moves through the different stages of their insurance plan.

## 2023-04-04 NOTE — Telephone Encounter (Signed)
No further action needed.

## 2023-04-06 ENCOUNTER — Encounter: Payer: Self-pay | Admitting: Family Medicine

## 2023-04-11 ENCOUNTER — Encounter: Payer: Self-pay | Admitting: Family Medicine

## 2023-04-11 ENCOUNTER — Telehealth (INDEPENDENT_AMBULATORY_CARE_PROVIDER_SITE_OTHER): Payer: 59 | Admitting: Family Medicine

## 2023-04-11 DIAGNOSIS — R42 Dizziness and giddiness: Secondary | ICD-10-CM | POA: Diagnosis not present

## 2023-04-11 DIAGNOSIS — G4489 Other headache syndrome: Secondary | ICD-10-CM

## 2023-04-11 MED ORDER — BUDESONIDE-FORMOTEROL FUMARATE 160-4.5 MCG/ACT IN AERO: 2.0000 | INHALATION_SPRAY | Freq: Two times a day (BID) | RESPIRATORY_TRACT | 0 refills | Status: DC

## 2023-04-11 MED ORDER — VENLAFAXINE HCL ER 37.5 MG PO CP24: 37.5000 mg | ORAL_CAPSULE | Freq: Every day | ORAL | 0 refills | Status: DC

## 2023-04-11 NOTE — Progress Notes (Signed)
 Virtual Visit via Video Note  I connected with Dustin [name]  on 04/20/23 at  3:40 PM EST by a video enabled telemedicine application and verified that I am speaking with the correct person using two identifiers.  Location patient: [place] Location provider:work or home office Persons participating in the virtual visit: patient, provider  I discussed the limitations and requested verbal permission for telemedicine visit. The patient expressed understanding and agreed to proceed.  CC: 33 y/o male being seen today for 2-week follow-up daily headaches. A/P as of last visit: Chronic cluster headache syndrome. Discontinue the propranolol  10 mg a day (he states he was put on this for his POTS). Start InnoPran  XL 80 mg nightly. Okay to continue rizatriptan  5 mg daily as needed but emphasized the need to limit this medicine is much as possible to avoid rebound headaches/medication overuse headaches.  INTERIM HX: [place] urgent care at [facility] visit 03/31/2023 for cough. IM Depo-Medrol  80 mg administered. Chest x-ray unremarkable. Symptoms slow to resolve.  He was unable to get the InnoPran  for unknown reason. Says the location, quality, and frequency of his headaches is unchanged.  Still taking Maxalt  5 mg at least every other day.  States he is not using Tylenol or Motrin very much.  States he got some [employer] recently and feels like these are helping him feel better, especially regarding his arthralgias.  Still endorses recurrent vertigo.  Also feels a dull, constant pulsatile tinnitus and ringing occasional very brief sharp pain in both ears.   Review of systems: No fever, no visual abnormalities, no rash, no joint swelling, no chest pain.  No focal weakness, no tremor, no gait instability.  Past Medical History:  Diagnosis Date   Alcoholism in recovery (HCC)    Asthma    Bilateral hand pain    cymbalta  VERY helpful but caused intolerable nausea.  Meloxicam  ok   Bilateral  headaches 01/27/2015   neuro->low serotonin level->cymbalta    Epiploic appendagitis    09/2020   Fibromyalgia    rheum eval 2024   GAD (generalized anxiety disorder)    GERD (gastroesophageal reflux disease)    hx esophagitis   Hearing loss    IBS (irritable bowel syndrome)    Laryngopharyngeal reflux (LPR)    POTS (postural orthostatic tachycardia syndrome) 02/13/2016   florinef  no help.  Propranolol  helpful   Restless legs syndrome    Seasonal allergies    Vertigo    2018, central-->MRI and MRA brain normal    Past Surgical History:  Procedure Laterality Date   CARDIOVASCULAR STRESS TEST     12/2015 myoview  normal   ESOPHAGOGASTRODUODENOSCOPY     02/06/2020: Esophagitis, erosive gastro duodenopathy   INGUINAL HERNIA REPAIR     bilat as a teen   SINUS ENDO W/FUSION     20s   WISDOM TOOTH EXTRACTION       Current Outpatient Medications:    budesonide -formoterol  (SYMBICORT ) 160-4.5 MCG/ACT inhaler, Inhale 2 puffs into the lungs 2 (two) times daily., Disp: 1 each, Rfl: 0   venlafaxine  XR (EFFEXOR  XR) 37.5 MG 24 hr capsule, Take 1 capsule (37.5 mg total) by mouth daily with breakfast., Disp: 30 capsule, Rfl: 0   ALBUTEROL IN, Inhale 1-2 puffs into the lungs as needed., Disp: , Rfl:    meloxicam  (MOBIC ) 15 MG tablet, 1 tab po qd, Disp: 30 tablet, Rfl: 5   metoCLOPramide  (REGLAN ) 10 MG tablet, 1 tab po bid with meals, Disp: 60 tablet, Rfl: 1   omeprazole  (  PRILOSEC) 40 MG capsule, 1 cap po bid, Disp: 180 capsule, Rfl: 1   propranolol  (INNOPRAN  XL) 80 MG 24 hr capsule, Take 1 capsule (80 mg total) by mouth at bedtime., Disp: 30 capsule, Rfl: 0   rizatriptan  (MAXALT ) 5 MG tablet, Take 1 tablet (5 mg total) by mouth as needed for migraine. May repeat in 2 hours if needed, Disp: 10 tablet, Rfl: 1  EXAM:  VITALS per patient if applicable:     03/31/2023    3:33 PM 03/28/2023    2:52 PM 03/14/2023    4:17 PM  Vitals with BMI  Weight  188 lbs   Systolic 114 115 883  Diastolic 72  63 78  Pulse 87 69 71     GENERAL: alert, oriented, appears well and in no acute distress  HEENT: atraumatic, conjunttiva clear, no obvious abnormalities on inspection of external nose and ears  NECK: normal movements of the head and neck  LUNGS: on inspection no signs of respiratory distress, breathing rate appears normal, no obvious gross SOB, gasping or wheezing  CV: no obvious cyanosis  MS: moves all visible extremities without noticeable abnormality  PSYCH/NEURO: pleasant and cooperative, no obvious depression or anxiety, speech and thought processing grossly intact  LABS: none today    Chemistry      Component Value Date/Time   NA 140 04/30/2022 1551   K 4.0 04/30/2022 1551   CL 104 04/30/2022 1551   CO2 27 04/30/2022 1551   BUN 16 04/30/2022 1551   CREATININE 1.00 04/30/2022 1551      Component Value Date/Time   CALCIUM 9.3 04/30/2022 1551   ALKPHOS 48 11/03/2020 1734   AST 17 04/30/2022 1551   ALT 40 04/30/2022 1551   BILITOT 0.3 04/30/2022 1551     Lab Results  Component Value Date   WBC 8.2 04/30/2022   HGB 14.6 04/30/2022   HCT 41.1 04/30/2022   MCV 90.3 04/30/2022   PLT 262 04/30/2022   ASSESSMENT AND PLAN:  Discussed the following assessment and plan:  #1 headache syndrome most consistent with cluster headaches. He has had quite a long history of headaches as well as nonspecific dizziness and vertigo. Unfortunately, he has never noted a dramatic or lasting effect from treatments in the past. He had normal MRI imaging of the brain in 2015 and again in 2018.  MRA of the head in 2018 normal. Will ask neurology to see him for further expert evaluation and management. Today I did start Effexor  XR 37.5 mg a day.  Continue Maxalt  5 mg but try to limit use.    I discussed the assessment and treatment plan with the patient. The patient was provided an opportunity to ask questions and all were answered. The patient agreed with the plan and demonstrated an  understanding of the instructions.   F/u: 4 wks f/u HAs  Signed:  Gerlene Hockey, MD           04/20/2023

## 2023-04-20 ENCOUNTER — Ambulatory Visit: Payer: 59 | Admitting: Family Medicine

## 2023-04-20 ENCOUNTER — Encounter: Payer: Self-pay | Admitting: Family Medicine

## 2023-04-20 VITALS — BP 122/60 | HR 78 | Wt 184.0 lb

## 2023-04-20 DIAGNOSIS — R0602 Shortness of breath: Secondary | ICD-10-CM

## 2023-04-20 DIAGNOSIS — G4489 Other headache syndrome: Secondary | ICD-10-CM

## 2023-04-20 DIAGNOSIS — R519 Headache, unspecified: Secondary | ICD-10-CM

## 2023-04-20 DIAGNOSIS — J9801 Acute bronchospasm: Secondary | ICD-10-CM | POA: Diagnosis not present

## 2023-04-20 MED ORDER — PROPRANOLOL HCL 10 MG PO TABS: ORAL_TABLET | ORAL | 1 refills | Status: DC

## 2023-04-20 MED ORDER — PREDNISONE 10 MG PO TABS: ORAL_TABLET | ORAL | 0 refills | Status: DC

## 2023-04-20 NOTE — Progress Notes (Signed)
 OFFICE VISIT  04/20/2023  CC:  Chief Complaint  Patient presents with   Shortness of Breath    Pt mentions he is still having breathing issues; when he talks he gets SOB. Mentions new script for inhaler.     Patient is a 33 y.o. male who presents for shortness of breath and cough.  INTERIM HX: Still feels short of breath when talking in full/extended sentences. Often feels like he is got a cough towards the end of a sentence.  No wheezing.  He feels more short of breath after he gets up and walks around.  Albuterol inhaler gives brief improvement. No fever, no mucus production.  Still with the same left periorbital headaches.  He started Effexor  but does not feel like it has helped and it makes him feel "off"  Looking back in chart he did have an upper respiratory/bronchitic illness and was seen at an urgent care on 03/14/2023 and prescribed prednisone  40 mg a day x 5 days as well as a Z-Pak.  He did feel some improvement with this but noted that his symptoms returned a few days after he finished the medications. He was seen again at an urgent care on 03/31/2023 and was given a steroid injection.  A chest x-ray at that time showed slightly low lung volumes, no focal airspace consolidation. I reviewed these today and agree.  Additionally, he saw pulmonologist June 12, 2021. It was felt like his symptoms were consistent with asthma.  Spirometry at that time showed "Mild airway obstruction and low vital capacity, perhaps due to restriction of lung volumes".   Past Medical History:  Diagnosis Date   Alcoholism in recovery (HCC)    Asthma    Bilateral hand pain    cymbalta  VERY helpful but caused intolerable nausea.  Meloxicam  ok   Bilateral headaches 01/27/2015   neuro->low serotonin level->cymbalta    Epiploic appendagitis    09/2020   Fibromyalgia    rheum eval 2024   GAD (generalized anxiety disorder)    GERD (gastroesophageal reflux disease)    hx esophagitis   Hearing loss     IBS (irritable bowel syndrome)    Laryngopharyngeal reflux (LPR)    POTS (postural orthostatic tachycardia syndrome) 02/13/2016   florinef  no help.  Propranolol  helpful   Restless legs syndrome    Seasonal allergies    Vertigo    2018, central-->MRI and MRA brain normal    Past Surgical History:  Procedure Laterality Date   CARDIOVASCULAR STRESS TEST     12/2015 myoview  normal   ESOPHAGOGASTRODUODENOSCOPY     02/06/2020: Esophagitis, erosive gastro duodenopathy   INGUINAL HERNIA REPAIR     bilat as a teen   SINUS ENDO W/FUSION     20s   WISDOM TOOTH EXTRACTION      Outpatient Medications Prior to Visit  Medication Sig Dispense Refill   ALBUTEROL IN Inhale 1-2 puffs into the lungs as needed.     budesonide -formoterol  (SYMBICORT ) 160-4.5 MCG/ACT inhaler Inhale 2 puffs into the lungs 2 (two) times daily. 1 each 0   meloxicam  (MOBIC ) 15 MG tablet 1 tab po qd 30 tablet 5   metoCLOPramide  (REGLAN ) 10 MG tablet 1 tab po bid with meals 60 tablet 1   omeprazole  (PRILOSEC) 40 MG capsule 1 cap po bid 180 capsule 1   propranolol  (INDERAL ) 10 MG tablet 1 tab po qd 90 tablet 1   rizatriptan  (MAXALT ) 5 MG tablet Take 1 tablet (5 mg total) by mouth as needed for  migraine. May repeat in 2 hours if needed 10 tablet 1   venlafaxine  XR (EFFEXOR  XR) 37.5 MG 24 hr capsule Take 1 capsule (37.5 mg total) by mouth daily with breakfast. 30 capsule 0   No facility-administered medications prior to visit.    Allergies  Allergen Reactions   Amoxicillin     Cymbalta  [Duloxetine  Hcl] Nausea Only   Biaxin [Clarithromycin] Hives   Ceftin [Cefuroxime Axetil] Hives   Penicillins Hives    Has patient had a PCN reaction causing immediate rash, facial/tongue/throat swelling, SOB or lightheadedness with hypotension: Yes Has patient had a PCN reaction causing severe rash involving mucus membranes or skin necrosis: Yes Has patient had a PCN reaction that required hospitalization: No Has patient had a PCN  reaction occurring within the last 10 years: No If all of the above answers are "NO", then may proceed with Cephalosporin use.    Tetracyclines & Related Hives    Review of Systems As per HPI  PE:    04/20/2023    3:48 PM 03/31/2023    3:33 PM 03/28/2023    2:52 PM  Vitals with BMI  Weight 184 lbs  188 lbs  Systolic 122 114 161  Diastolic 60 72 63  Pulse 78 87 69     Physical Exam  Gen: Alert, well appearing.  Patient is oriented to person, place, time, and situation. AFFECT: pleasant, lucid thought and speech. CV: RRR, no m/r/g.   LUNGS: CTA bilat, nonlabored resps, good aeration in all lung fields. EXT: no clubbing or cyanosis.  no edema.    LABS:  Last CBC Lab Results  Component Value Date   WBC 8.2 04/30/2022   HGB 14.6 04/30/2022   HCT 41.1 04/30/2022   MCV 90.3 04/30/2022   MCH 32.1 04/30/2022   RDW 13.1 04/30/2022   PLT 262 04/30/2022   Last metabolic panel Lab Results  Component Value Date   GLUCOSE 88 04/30/2022   NA 140 04/30/2022   K 4.0 04/30/2022   CL 104 04/30/2022   CO2 27 04/30/2022   BUN 16 04/30/2022   CREATININE 1.00 04/30/2022   GFRNONAA >60 11/03/2020   CALCIUM 9.3 04/30/2022   PROT 7.5 05/07/2022   ALBUMIN 5.0 11/03/2020   BILITOT 0.3 04/30/2022   ALKPHOS 48 11/03/2020   AST 17 04/30/2022   ALT 40 04/30/2022   ANIONGAP 11 11/03/2020   IMPRESSION AND PLAN:  #1 cough and shortness of breath. Suspect he had a respiratory virus that triggered asthma flare. He did get some improvement with a 5-day prednisone  burst, albeit short-lived. Called pharmacy today, Symbicort  being filled now.  He will start this today. Additionally, will give prednisone  40/day x 4, 20/day x 4, and then 10 daily x 4d.  #2 chronic daily headache. We are in the process of getting him in with a neurologist. Our recent trial of low-dose Effexor  resulted in mild brain fog so he is going to likely discontinue the medication. Continue Maxalt  5 mg daily as  needed.  An After Visit Summary was printed and given to the patient.  FOLLOW UP: No follow-ups on file.  Signed:  Arletha Lady, MD           04/20/2023

## 2023-05-03 ENCOUNTER — Ambulatory Visit: Payer: Self-pay

## 2023-05-04 ENCOUNTER — Encounter: Payer: Self-pay | Admitting: Family Medicine

## 2023-05-04 ENCOUNTER — Other Ambulatory Visit: Payer: Self-pay | Admitting: Family Medicine

## 2023-05-04 ENCOUNTER — Ambulatory Visit (INDEPENDENT_AMBULATORY_CARE_PROVIDER_SITE_OTHER): Payer: 59 | Admitting: Family Medicine

## 2023-05-04 VITALS — BP 113/79 | HR 80 | Wt 180.0 lb

## 2023-05-04 DIAGNOSIS — R519 Headache, unspecified: Secondary | ICD-10-CM

## 2023-05-04 DIAGNOSIS — R590 Localized enlarged lymph nodes: Secondary | ICD-10-CM

## 2023-05-04 DIAGNOSIS — M2550 Pain in unspecified joint: Secondary | ICD-10-CM

## 2023-05-04 DIAGNOSIS — R053 Chronic cough: Secondary | ICD-10-CM | POA: Diagnosis not present

## 2023-05-04 DIAGNOSIS — R0602 Shortness of breath: Secondary | ICD-10-CM | POA: Diagnosis not present

## 2023-05-04 NOTE — Progress Notes (Signed)
OFFICE VISIT  05/04/2023  CC:  Chief Complaint  Patient presents with   Cough    F/u.     Patient is a 33 y.o. male who presents unaccompanied for 2-week follow-up cough. A/P as of last visit: "#1 cough and shortness of breath. Suspect he had a respiratory virus that triggered asthma flare. He did get some improvement with a 5-day prednisone burst, albeit short-lived. Called pharmacy today, Symbicort being filled now.  He will start this today. Additionally, will give prednisone 40/day x 4, 20/day x 4, and then 10 daily x 4d.   #2 chronic daily headache. We are in the process of getting him in with a neurologist. Our recent trial of low-dose Effexor resulted in mild brain fog so he is going to likely discontinue the medication. Continue Maxalt 5 mg daily as needed."  INTERIM HX: He got slight improvement while on the prednisone but symptoms soon returned the same. Symbicort has not helped. He takes albuterol occasionally and it does not help. He feels like a cough drop does help some. No fever.  His mucus is not purulent. He does still feel chronically short of breath, easily winded with light activity.  He has no swelling in his extremities, no chest pain.  Headaches are actually little bit better in the last couple of weeks.  He attributes this to getting a massage of his neck and shoulders. Scheduled to see neurologist 07/07/23  Systems: Chronic feeling of fullness in the left side of his neck anteriorly. He feels a palpable nodule in the area, states that it has been there for a few years. He has chronic IBS with diarrhea symptoms, occasionally bright red blood on toilet tissue.  No mucus in the stool.  No melena. Chronic bilateral hand pain, left worse than right.  Past Medical History:  Diagnosis Date   Alcoholism in recovery (HCC)    Asthma    Bilateral hand pain    cymbalta VERY helpful but caused intolerable nausea.  Meloxicam ok   Bilateral headaches 01/27/2015    neuro->low serotonin level->cymbalta   Epiploic appendagitis    09/2020   Fibromyalgia    rheum eval 2024   GAD (generalized anxiety disorder)    GERD (gastroesophageal reflux disease)    hx esophagitis   Hearing loss    IBS (irritable bowel syndrome)    Laryngopharyngeal reflux (LPR)    POTS (postural orthostatic tachycardia syndrome) 02/13/2016   florinef no help.  Propranolol helpful   Restless legs syndrome    Seasonal allergies    Vertigo    2018, central-->MRI and MRA brain normal    Past Surgical History:  Procedure Laterality Date   CARDIOVASCULAR STRESS TEST     12/2015 myoview normal   ESOPHAGOGASTRODUODENOSCOPY     02/06/2020: Esophagitis, erosive gastro duodenopathy   INGUINAL HERNIA REPAIR     bilat as a teen   SINUS ENDO W/FUSION     20s   WISDOM TOOTH EXTRACTION      Outpatient Medications Prior to Visit  Medication Sig Dispense Refill   ALBUTEROL IN Inhale 1-2 puffs into the lungs as needed.     budesonide-formoterol (SYMBICORT) 160-4.5 MCG/ACT inhaler Inhale 2 puffs into the lungs 2 (two) times daily. 1 each 0   meloxicam (MOBIC) 15 MG tablet 1 tab po qd 30 tablet 5   metoCLOPramide (REGLAN) 10 MG tablet 1 tab po bid with meals 60 tablet 1   omeprazole (PRILOSEC) 40 MG capsule 1 cap po bid  180 capsule 1   propranolol (INDERAL) 10 MG tablet 1 tab po qd 90 tablet 1   rizatriptan (MAXALT) 5 MG tablet Take 1 tablet (5 mg total) by mouth as needed for migraine. May repeat in 2 hours if needed 10 tablet 1   venlafaxine XR (EFFEXOR XR) 37.5 MG 24 hr capsule Take 1 capsule (37.5 mg total) by mouth daily with breakfast. 30 capsule 0   predniSONE (DELTASONE) 10 MG tablet 4 tabs every day x 4d, then 2 tabs every day x 4d, then 1 tab  every day x 4d 28 tablet 0   No facility-administered medications prior to visit.    Allergies  Allergen Reactions   Amoxicillin    Cymbalta [Duloxetine Hcl] Nausea Only   Biaxin [Clarithromycin] Hives   Ceftin [Cefuroxime Axetil]  Hives   Penicillins Hives    Has patient had a PCN reaction causing immediate rash, facial/tongue/throat swelling, SOB or lightheadedness with hypotension: Yes Has patient had a PCN reaction causing severe rash involving mucus membranes or skin necrosis: Yes Has patient had a PCN reaction that required hospitalization: No Has patient had a PCN reaction occurring within the last 10 years: No If all of the above answers are "NO", then may proceed with Cephalosporin use.    Tetracyclines & Related Hives    Review of Systems As per HPI  PE:    05/04/2023    3:44 PM 04/20/2023    3:48 PM 03/31/2023    3:33 PM  Vitals with BMI  Weight 180 lbs 184 lbs   Systolic 113 122 132  Diastolic 79 60 72  Pulse 80 78 87     Physical Exam  Gen: Alert, well appearing.  Patient is oriented to person, place, time, and situation. AFFECT: pleasant, lucid thought and speech. CV: RRR, no m/r/g.   LUNGS: CTA bilat, nonlabored resps, good aeration in all lung fields. EXT: no clubbing or cyanosis.  no edema.    LABS:  Last CBC Lab Results  Component Value Date   WBC 8.2 04/30/2022   HGB 14.6 04/30/2022   HCT 41.1 04/30/2022   MCV 90.3 04/30/2022   MCH 32.1 04/30/2022   RDW 13.1 04/30/2022   PLT 262 04/30/2022     Chemistry      Component Value Date/Time   NA 140 04/30/2022 1551   K 4.0 04/30/2022 1551   CL 104 04/30/2022 1551   CO2 27 04/30/2022 1551   BUN 16 04/30/2022 1551   CREATININE 1.00 04/30/2022 1551      Component Value Date/Time   CALCIUM 9.3 04/30/2022 1551   ALKPHOS 48 11/03/2020 1734   AST 17 04/30/2022 1551   ALT 40 04/30/2022 1551   BILITOT 0.3 04/30/2022 1551      IMPRESSION AND PLAN:  #1 chronic shortness of breath.  He has subacute cough--> approximately 2 months now. Prednisone brings slight short-term relief. Chest x-ray 03/31/2023 with no significant abnormality. Okay to discontinue Symbicort and albuterol. Check high-resolution CT chest for signs of  interstitial lung disease.  #2 chronic polyarthralgia, particularly hands. Check ANA panel, sed rate, and CRP.  #3 lymphadenopathy, jugulodigastric. Chronic.  Imaging by ultrasound in 2021 and again in 2023 showed some mildly enlarged, likely reactive cervical lymph nodes.  No interval growth.  A CT soft tissue neck did not reveal any abnormality on 03/17/20. Considering heme-onc referral.  No further workup at this time, though.  #4 chronic daily headache. Scheduled to see neurologist 07/07/23  An After Visit  Summary was printed and given to the patient.  FOLLOW UP: Return in about 6 weeks (around 06/15/2023) for f/u cough/sob.  Signed:  Santiago Bumpers, MD           05/04/2023

## 2023-05-05 LAB — COMPREHENSIVE METABOLIC PANEL
ALT: 28 U/L (ref 0–53)
AST: 15 U/L (ref 0–37)
Albumin: 5 g/dL (ref 3.5–5.2)
Alkaline Phosphatase: 64 U/L (ref 39–117)
BUN: 17 mg/dL (ref 6–23)
CO2: 26 meq/L (ref 19–32)
Calcium: 9.9 mg/dL (ref 8.4–10.5)
Chloride: 101 meq/L (ref 96–112)
Creatinine, Ser: 0.97 mg/dL (ref 0.40–1.50)
GFR: 101.71 mL/min (ref >60.00)
Glucose, Bld: 95 mg/dL (ref 70–99)
Potassium: 3.5 meq/L (ref 3.5–5.1)
Sodium: 139 meq/L (ref 135–145)
Total Bilirubin: 0.6 mg/dL (ref 0.2–1.2)
Total Protein: 7.3 g/dL (ref 6.0–8.3)

## 2023-05-05 LAB — SEDIMENTATION RATE: Sed Rate: 5 mm/h (ref 0–15)

## 2023-05-05 LAB — C-REACTIVE PROTEIN: CRP: <1 mg/dL (ref 0.5–20.0)

## 2023-05-06 ENCOUNTER — Ambulatory Visit (HOSPITAL_BASED_OUTPATIENT_CLINIC_OR_DEPARTMENT_OTHER): Payer: 59

## 2023-05-06 ENCOUNTER — Encounter: Payer: Self-pay | Admitting: Family Medicine

## 2023-05-06 NOTE — Telephone Encounter (Signed)
 No further action needed.

## 2023-05-08 LAB — TIER 3
Centromere Ab Screen: <1 AI
Ribosomal P Protein Ab: <1 AI

## 2023-05-08 LAB — TIER 1
Chromatin (Nucleosomal) Antibody: <1 AI
ENA SM Ab Ser-aCnc: <1 AI
Ribonucleic Protein(ENA) Antibody, IgG: <1 AI
SM/RNP: <1 AI
ds DNA Ab: <1 [IU]/mL

## 2023-05-08 LAB — TIER 2
Jo-1 Autoabs: <1 AI
SSA (Ro) (ENA) Antibody, IgG: <1 AI
SSB (La) (ENA) Antibody, IgG: <1 AI
Scleroderma (Scl-70) (ENA) Antibody, IgG: <1 AI

## 2023-05-08 LAB — ANTI-NUCLEAR AB-TITER (ANA TITER): ANA Titer 1: 1:40 {titer} — ABNORMAL HIGH

## 2023-05-08 LAB — ANA SCREEN,IFA,REFLEX TITER/PATTERN,REFLEX MPLX 11 AB CASCADE
Anti Nuclear Antibody (ANA): POSITIVE — AB
Cyclic Citrullin Peptide Ab: <16 U
MUTATED CITRULLINATED VIMENTIN (MCV) AB: <20 U/mL (ref <20)
Rheumatoid fact SerPl-aCnc: <10 [IU]/mL (ref <14)

## 2023-05-08 LAB — INTERPRETATION

## 2023-05-09 ENCOUNTER — Encounter: Payer: Self-pay | Admitting: Family Medicine

## 2023-05-11 ENCOUNTER — Ambulatory Visit (HOSPITAL_BASED_OUTPATIENT_CLINIC_OR_DEPARTMENT_OTHER)
Admission: RE | Admit: 2023-05-11 | Discharge: 2023-05-11 | Disposition: A | Discharge status: Home / Self Care | Payer: 59 | Source: Ambulatory Visit | Attending: Family Medicine | Admitting: Family Medicine

## 2023-05-11 DIAGNOSIS — R0602 Shortness of breath: Secondary | ICD-10-CM | POA: Insufficient documentation

## 2023-05-11 DIAGNOSIS — R053 Chronic cough: Secondary | ICD-10-CM | POA: Diagnosis present

## 2023-05-18 ENCOUNTER — Ambulatory Visit (INDEPENDENT_AMBULATORY_CARE_PROVIDER_SITE_OTHER): Payer: 59 | Admitting: Neurology

## 2023-05-18 ENCOUNTER — Encounter: Payer: Self-pay | Admitting: Neurology

## 2023-05-18 VITALS — Ht 71.0 in | Wt 184.0 lb

## 2023-05-18 DIAGNOSIS — G4452 New daily persistent headache (NDPH): Secondary | ICD-10-CM | POA: Insufficient documentation

## 2023-05-18 DIAGNOSIS — R768 Other specified abnormal immunological findings in serum: Secondary | ICD-10-CM | POA: Diagnosis not present

## 2023-05-18 DIAGNOSIS — F411 Generalized anxiety disorder: Secondary | ICD-10-CM | POA: Insufficient documentation

## 2023-05-18 DIAGNOSIS — G4440 Drug-induced headache, not elsewhere classified, not intractable: Secondary | ICD-10-CM | POA: Insufficient documentation

## 2023-05-18 MED ORDER — AIMOVIG 140 MG/ML ~~LOC~~ SOAJ: SUBCUTANEOUS | 5 refills | Status: DC

## 2023-05-18 MED ORDER — NURTEC 75 MG PO TBDP: ORAL_TABLET | ORAL | Status: DC

## 2023-05-18 MED ORDER — PREDNISONE 5 MG PO TABS: ORAL_TABLET | ORAL | 0 refills | Status: DC

## 2023-05-18 NOTE — Progress Notes (Signed)
[facility]  Provider:  Dr Annamarie Yamaguchi Referring Provider: Jeoffrey Massed, MD Primary Care Physician:  Jeoffrey Massed, MD  Chief Complaint  Patient presents with   New Patient (Initial Visit)    Pt in 2 Pt here for dizziness and migraines . Pt states 1 migraine Can 2 weeks Pt states migraine pain starts behind left eye and travels to back of head neck stiffness  Pt states light sensitivity .     HPI:  Dustin [name] is a 33 y.o. male and seen here upon referral from Dr. 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 He prescribed rizatriptan/ Maxalt 5 mg .  ROS : This patient reports onset of headaches daily over a period of 6 months.  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  He reports a daily headache for the past 6 months.  Left eye retro-orbital pressure , blurred vision,  sometimes tearing. Pt states migraine pain starts behind left eye and travels to back of head neck stiffness  Pt reports light sensitivity , not  phonophobia. He takes ibuprofen daily (!) and Goody Powders ( !) Possible analgesic rebound headaches. This is not a description fitting cluster Migraines.  7 months ago he would have dull headaches, migraines were 2-3 / months Migraines  responded to NSAIDS.  I doubt Migraines-  no nausea at the time, no photophobia, not focal  HA>  He is on Reglan (!)>  IBS    Waking up in the middle of the night with a panic attacks, had collapsed and EMT was called. He is not under psychiatric care. He is not in counseling.  He is divorced , and has been in a custody battle . Remarried 3 years ago.    Remote Hx: He had been followed here at [facility] until January  5th 2018. He presented at the time with POTS, headaches, nausea  and vertigo. Not seen in over 7 years .           Past visit: 10-24-2014  Dustin [name] is a 33 y.o. male, seen here as a referral from Dr. Azucena Cecil for vertigo  evaluation. Dustin [name] is a 33 year old Caucasian tended male who presents for an evaluation of vertigo.   His wife are here together today reporting that Mr. Dollens underwent a sinus surgery in September 2015 within days after the sinus surgery his sinus problems resolved but he begun experiencing vertigo. An MRI was obtained the months following to surgery which showed no acute brain abnormalities it was done with and without contrast. There was still some sinus mucosal thickening noted. He also has a history of asthma and is taking antiallergy medicines and nasal spray. He describes his dizziness as a spinning sensation to the right felt that his surrounding spun around him and not that he was in motion spells occurred without any noticeable trigger. At times he would just watch TV or drive and was not aware of a rapid head movement or positional change bring it on. Some spells lasted a couple of minutes for other more severe one lasted a couple of hours. He usually has a spell once or several times a day he reports that he has never been woken out of sleep by a vertigo spell or that's his sleep is affected in any way by this. When he wakes up in the morning he does not wake up first feeling dizzy. He has some headaches but these  seem to be unrelated to the vertigo spells. He did have some nausea with some of the vertigo spells but no diplopia he feels dizzy and lightheaded at times almost as if passing out but has never so far lost awareness or consciousness. 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  He had tried meclizine and his primary care first which did not help he had seen Dr. Adriana Mccallum, D.O. Who  prescribed topiramate concerning that this may be a migraine equivalent. This did not work.    Again the MRI was unremarkable in November 2015 ENT testing showed hearing loss and the possibility of a  retrocochlear origin of vertigo needs to be evaluated.  MRI 05-12-2026 : The orbits and their contents, paranasal sinuses and calvarium are notable for mucosal thickening in the maxillary, ethmoid and sphenoid sinuses. Intracranial flow voids are present.   Review of Systems: Out of a complete 14 system review, the patient complains of only the following symptoms, and all other reviewed systems are negative.  Epworth Sleepiness score:   Fatigue:  POTS, Vertigo ,nausea, palpitations. Chest tightness.   Joint pain.   GERD on  Reglan, IBS, Phenergan and BID Prilosec.    He is eating daily NSAIDS and goody powders.   Social History   Socioeconomic History   Marital status: Married    Spouse name: Dustin [name]   Number of children: 1   Years of education: [employer]   Highest education level: Not on file  Occupational History   Not on file  Tobacco Use   Smoking status: Former    Current packs/day: 0.00    Average packs/day: 0.5 packs/day for 15.0 years (7.5 ttl pk-yrs)    Types: Cigarettes    Start date: 2008    Quit date: 04/2021    Years since quitting: 2.1   Smokeless tobacco: Never   Tobacco comments:    Still vapes occasionally  Vaping Use   Vaping status: Former   Substances: Nicotine, Flavoring  Substance and Sexual Activity   Alcohol use: Not Currently    Alcohol/week: 0.0 standard drinks of alcohol   Drug use: No   Sexual activity: Yes    Partners: Female    Birth control/protection: None  Other Topics Concern   Not on file  Social History Narrative   Patient lives at home with his wife Dustin [name]).   Educ: HS   Occup: truck driver   +tob   Hx alc abuse   Social Drivers of Corporate investment banker Strain: Not on file  Food Insecurity: Not on file  Transportation Needs: Not on file  Physical Activity: Not on file  Stress: Not on file  Social Connections: Not on file  Intimate Partner Violence: Not on file    Family History  Problem Relation Age of Onset    Migraines Mother    Arthritis Mother    Asthma Mother    Depression Mother    Hypertension Mother    Esophageal cancer Father 51   Early death Father    Arthritis Maternal Grandmother    Cancer Maternal Grandmother    Lung cancer Paternal Grandfather     Past Medical History:  Diagnosis Date   Alcoholism in recovery (HCC)    Asthma    Bilateral hand pain    cymbalta VERY helpful but caused intolerable nausea.  Meloxicam ok   Bilateral headaches 01/27/2015  neuro->low serotonin level->cymbalta   Epiploic appendagitis    09/2020   Fibromyalgia    rheum eval 2024   GAD (generalized anxiety disorder)    GERD (gastroesophageal reflux disease)    hx esophagitis   Hearing loss    IBS (irritable bowel syndrome)    Laryngopharyngeal reflux (LPR)    POTS (postural orthostatic tachycardia syndrome) 02/13/2016   florinef no help.  Propranolol helpful   Restless legs syndrome    Seasonal allergies    Vertigo    2018, central-->MRI and MRA brain normal    Past Surgical History:  Procedure Laterality Date   CARDIOVASCULAR STRESS TEST     12/2015 myoview normal   ESOPHAGOGASTRODUODENOSCOPY     02/06/2020: Esophagitis, erosive gastro duodenopathy   INGUINAL HERNIA REPAIR     bilat as a teen   SINUS ENDO W/FUSION     20s   WISDOM TOOTH EXTRACTION      Current Outpatient Medications  Medication Sig Dispense Refill   ALBUTEROL IN Inhale 1-2 puffs into the lungs as needed.     BLACK CURRANT SEED OIL PO Take by mouth.     budesonide-formoterol (SYMBICORT) 160-4.5 MCG/ACT inhaler Inhale 2 puffs into the lungs 2 (two) times daily. 1 each 0   meloxicam (MOBIC) 15 MG tablet 1 tab po qd 30 tablet 5   metoCLOPramide (REGLAN) 10 MG tablet 1 tab po bid with meals 60 tablet 1   omeprazole (PRILOSEC) 40 MG capsule 1 cap po bid 180 capsule 1   propranolol (INDERAL) 10 MG tablet 1 tab po qd 90 tablet 1   rizatriptan (MAXALT) 5 MG tablet Take 1 tablet (5 mg total) by mouth as needed for  migraine. May repeat in 2 hours if needed 10 tablet 1   S-Adenosylmethionine (SAME PO) Take by mouth.     venlafaxine XR (EFFEXOR-XR) 37.5 MG 24 hr capsule TAKE 1 CAPSULE BY MOUTH DAILY WITH BREAKFAST. 30 capsule 0   No current facility-administered medications for this visit.    Allergies as of 05/18/2023 - Review Complete 05/18/2023  Allergen Reaction Noted   Amoxicillin  04/14/2020   Cymbalta [duloxetine hcl] Nausea Only 02/04/2023   Biaxin [clarithromycin] Hives 09/10/2014   Ceftin [cefuroxime axetil] Hives 09/10/2014   Penicillins Hives 05/14/2014   Tetracyclines & related Hives 05/14/2014    Vitals: Ht 5\' 11"  (1.803 m)   Wt 184 lb (83.5 kg)   BMI 25.66 kg/m  Last Weight:  Wt Readings from Last 1 Encounters:  05/18/23 184 lb (83.5 kg)   Last Height:   Ht Readings from Last 1 Encounters:  05/18/23 5\' 11"  (1.803 m)   L The patient is awake, alert and appears not in acute distress. The patient is well groomed. Head: Normocephalic, atraumatic. Neck is supple. Mallampati 3, neck circumference: 14  Cardiovascular:  Regular rate and rhythm , without  murmurs or carotid bruit, and without distended neck veins. Respiratory: Lungs are clear to auscultation. Skin:  Without evidence of edema, or rash Trunk: normal posture.     Neurologic exam : The patient is awake and alert, oriented to place and time.   Memory subjective described as intact. There is a normal attention span & concentration ability.   Speech is fluent without  dysarthria, but there is mild dysphonia. Mood and affect are appropriate.   Cranial nerves: Pupils are equal and briskly reactive to light. Funduscopic exam without  evidence of pallor or edema. Extraocular movements  in vertical and horizontal planes intact  and without nystagmus.  Visual fields by finger perimetry are intact. Hearing to finger rub intact.   Facial sensation intact to fine touch.   Trigger facial point in the left angle of the orbit  medial / to the nasal bridge.  facial motor strength is symmetric and tongue and uvula move midline.  Tongue protrusion into either cheek is normal. Shoulder shrug is normal.    Motor exam:  Normal tone ,muscle bulk and symmetric  strength in all extremities.   Sensory:  tingling numbness comes and goes in fingertips.  Joint pain. Hands, knees,  hips, ankles, toes.    Coordination: Rapid alternating movements in the fingers/hands were normal.  Finger-to-nose maneuver  normal without evidence of ataxia, dysmetria or tremor.   Gait and station: Patient walks without assistive device and is able unassisted to climb up to the exam table. Strength within normal limits. Stance is stable and normal. Tandem gait is unfragmented. Romberg testing is negative . His righting reflexes are intact.    Deep tendon reflexes: in the  upper and lower extremities are symmetric and intact. Babinski maneuver response is downgoing.     Assessment: Total time for face to face interview and examination, for review of  images and laboratory testing, neurophysiology testing and pre-existing records, including out-of -network , was 45 minutes. Assessment is as follows here:  1)   analgesic rebound headaches ,  baseline headaches were not clearly migraines.  2)   steroid dose pack to treat sinus related causes,  see positive ANA test.  3)   aimovig  every 30 days.  4)   keep inderol at night po.   Plan:  Treatment plan and additional workup planned after today includes:   MRI brain ;  for new headache quality.  Steroids. Prednisone 5 mg tab. Take 15 mg po 3 days, than 10 mg 3 days  then 5 mg for the rest of the pack po in AM     RV in 6 -8  months.   Melvyn Novas, MD

## 2023-05-18 NOTE — Addendum Note (Signed)
Addended by: Melvyn Novas on: 05/18/2023 03:15 PM   Modules accepted: Orders

## 2023-05-18 NOTE — Patient Instructions (Signed)
Analgesic Rebound Headache An analgesic rebound headache is a secondary disorder that is caused by the overuse of pain medicine (analgesic) to treat the original (primary) headache. It is sometimes called a medication overuse headache or a drug-induced headache. Any type of primary headache can return as a rebound headache if a person regularly takes pain medicine. The types of primary headaches that are commonly related to rebound headaches include: Migraines. Tension headaches. These are caused by tense muscles in the head and neck area. Cluster headaches. These happen on one side of the head and around the eye. If rebound headaches continue, they can become long-term, daily headaches. What are the causes? Rebound headaches may be caused by frequent use of: Over-the-counter medicines, such as aspirin, ibuprofen, and acetaminophen. Sinus-relief medicines. Medicines that contain caffeine. Narcotic pain medicines, such as codeine and oxycodone. Some prescription migraine medicines. What are the signs or symptoms? The symptoms of a rebound headache are the same as the symptoms of the primary headache. Symptoms of specific types of headaches include: Migraine headache Pulsing or throbbing pain on one or both sides of the head. Severe pain that makes it hard to do daily activities. Pain that gets worse with physical activity. Nausea, vomiting, or both. Pain that may get worse around bright lights, loud noises, or smells. Vision changes. Numbness in one or both arms. Tension headache Pressure around the head. Dull, aching head pain. Pain felt over the front and sides of the head. Tenderness in the muscles of the head, neck, and shoulders. Cluster headache Severe pain that begins in or around one eye or temple. Droopy or swollen eyelid, or redness and tearing in the eye on the same side as the pain. One-sided head pain. Nausea. Runny nose. Sweaty, pale facial skin. Restlessness. How is  this diagnosed? Rebound headaches are diagnosed by reviewing: Your medical history, including a description of your primary headaches. Your pain medicines for your primary headaches and how often you take them. How is this treated? Rebound headaches may be treated or managed by: Stopping frequent use of pain medicine. This may make your headaches worse at first, but the pain should then become less manageable and less frequent and severe. Seeing a headache specialist. They may be able to help you manage your headaches and help make sure there is not another cause of the headaches. Using methods of stress relief, such as acupuncture, counseling, biofeedback, and massage. Follow these instructions at home: Medicines  Take over-the-counter and prescription medicines only as told by your health care provider. Stop the repeated use of pain medicine as told by your provider. Stopping can be hard. Carefully follow instructions from your provider. Lifestyle Do not drink alcohol. Do not use any products that contain nicotine or tobacco. These products include cigarettes, chewing tobacco, and vaping devices, such as e-cigarettes. If you need help quitting, ask your provider. Get 7-9 hours of sleep each night, or the amount recommended by your provider. Find ways to manage stress, such as acupuncture, counseling, biofeedback, and massage. Exercise regularly. Exercise for at least 30 minutes, 5 times each week. General instructions Avoid things that may bring on (trigger) your primary headaches, such as certain foods. Contact a health care provider if: Medicine does not help your migraine. Your pain keeps coming back even with medicine. Get help right away if: You have new headache pain. You have headache pain that is different than what you have felt in the past. You have numbness or tingling in your arms or  legs. You have changes in your speech or vision. This information is not intended to  replace advice given to you by your health care provider. Make sure you discuss any questions you have with your health care provider. Document Revised: 11/16/2021 Document Reviewed: 11/16/2021 Elsevier Patient Education  2024 [employer].

## 2023-05-19 ENCOUNTER — Other Ambulatory Visit: Payer: Self-pay | Admitting: Family Medicine

## 2023-05-20 ENCOUNTER — Telehealth: Payer: Self-pay

## 2023-05-20 NOTE — Telephone Encounter (Signed)
*  [facility]  Pharmacy Patient Advocate Encounter   Received notification from Fax that prior authorization for Aimovig 140MG /ML auto-injectors  is required/requested.   Insurance verification completed.   The patient is insured through [facility] .   Per test claim: PA required; PA started via CoverMyMeds. KEY [id number] . Please see clinical question(s) below that I am not finding the answer to in her chart and advise.   Is the patient unable to use, or has adequately tried and failed an 8-week trial of at least TWO (2) preventative therapies, from at least TWO (2) of the following drug classes: A) anticonvulsants (e.g., topiramate, divalproex, sodium valproate), B) antidepressants (e.g., amitriptyline, nortriptyline, venlafaxine), C) beta blockers (e.g., propranolol, metoprolol)? [Note: Duration of look-back period for prerequisite therapy trials is not strictly limited. Pre-requisite medication trials may be evaluated irrespective of when they were trialed, provided adequate documentation is available.]*

## 2023-05-24 ENCOUNTER — Telehealth: Payer: Self-pay

## 2023-05-24 NOTE — Telephone Encounter (Signed)
Depressants Tried  Anticonvulsants tried:    Beta blockers tried:

## 2023-05-24 NOTE — Telephone Encounter (Signed)
PA request has been Started. New Encounter created for follow up. For additional info see Pharmacy Prior Auth telephone encounter from 05/24/2023.

## 2023-05-24 NOTE — Telephone Encounter (Signed)
Tried to resubmit PA-however there is already a PA in the hub-will call insurance to complete PA. Medications PT has tried are Topamax and propranolol and Venlafaxine per review of past medication list.

## 2023-05-27 ENCOUNTER — Encounter: Payer: Self-pay | Admitting: Family Medicine

## 2023-05-30 ENCOUNTER — Telehealth: Payer: Self-pay | Admitting: Neurology

## 2023-05-30 NOTE — Telephone Encounter (Signed)
 Jari Favre health [employer]: [id number] exp. 05/30/23-07/14/23 sent to [facility] [phone]  [facility] is not in network with his insurance.

## 2023-06-07 ENCOUNTER — Ambulatory Visit (HOSPITAL_COMMUNITY)
Admission: RE | Admit: 2023-06-07 | Discharge: 2023-06-07 | Disposition: A | Discharge status: Home / Self Care | Payer: 59 | Source: Ambulatory Visit | Attending: Neurology | Admitting: Neurology

## 2023-06-07 DIAGNOSIS — G4452 New daily persistent headache (NDPH): Secondary | ICD-10-CM | POA: Insufficient documentation

## 2023-06-07 DIAGNOSIS — G4440 Drug-induced headache, not elsewhere classified, not intractable: Secondary | ICD-10-CM | POA: Diagnosis present

## 2023-06-07 DIAGNOSIS — F411 Generalized anxiety disorder: Secondary | ICD-10-CM | POA: Insufficient documentation

## 2023-06-07 DIAGNOSIS — R768 Other specified abnormal immunological findings in serum: Secondary | ICD-10-CM | POA: Insufficient documentation

## 2023-06-07 DIAGNOSIS — T3995XA Adverse effect of unspecified nonopioid analgesic, antipyretic and antirheumatic, initial encounter: Secondary | ICD-10-CM | POA: Insufficient documentation

## 2023-06-07 MED ORDER — GADOBUTROL 1 MMOL/ML IV SOLN
8.0000 mL | Freq: Once | INTRAVENOUS | Status: AC | PRN
  Administered 2023-06-07: 8 mL via INTRAVENOUS

## 2023-06-10 NOTE — Patient Instructions (Signed)

## 2023-06-13 ENCOUNTER — Encounter: Payer: Self-pay | Admitting: Family Medicine

## 2023-06-13 ENCOUNTER — Ambulatory Visit: Payer: 59 | Admitting: Family Medicine

## 2023-06-13 VITALS — BP 115/76 | HR 70 | Wt 185.6 lb

## 2023-06-13 DIAGNOSIS — R053 Chronic cough: Secondary | ICD-10-CM

## 2023-06-13 DIAGNOSIS — K580 Irritable bowel syndrome with diarrhea: Secondary | ICD-10-CM

## 2023-06-13 DIAGNOSIS — G4489 Other headache syndrome: Secondary | ICD-10-CM

## 2023-06-13 DIAGNOSIS — R0602 Shortness of breath: Secondary | ICD-10-CM | POA: Diagnosis not present

## 2023-06-13 NOTE — Progress Notes (Signed)
 OFFICE VISIT  06/13/2023  CC:  Chief Complaint  Patient presents with   Cough    F/U.     Patient is a 33 y.o. male who presents for 6 week f/u cough and SOB and chronic daily HA. A/P as of last visit: "#1 chronic shortness of breath.  He has subacute cough--> approximately 2 months now. Prednisone brings slight short-term relief. Chest x-ray 03/31/2023 with no significant abnormality. Okay to discontinue Symbicort and albuterol. Check high-resolution CT chest for signs of interstitial lung disease.   #2 chronic polyarthralgia, particularly hands. Check ANA panel, sed rate, and CRP.   #3 lymphadenopathy, jugulodigastric. Chronic.  Imaging by ultrasound in 2021 and again in 2023 showed some mildly enlarged, likely reactive cervical lymph nodes.  No interval growth.  A CT soft tissue neck did not reveal any abnormality on 03/17/20. Considering heme-onc referral.  No further workup at this time, though.   #4 chronic daily headache. Scheduled to see neurologist 07/07/23."  INTERIM HX: Says cough is gone, some mild intermittent feeling of SOB/difficulty getting deep breath. High-resolution chest CT to 525: "IMPRESSION: 1. No evidence of fibrotic interstitial lung disease. 2. Lobular air trapping on expiratory phase imaging, consistent with small airways disease. 3. No acute airspace disease."  He saw a neurologist, Dr. Vickey Huger, on 05/18/2023.  Her assessment was analgesic rebound headaches.  Says baseline HA syndrome not c/w migraine/cluster. She prescribed a steroid Dosepak to treat sinus related causes and prescribed Aimovig every 30 days.  Continue Inderal nightly. MRI brain ordered, completed 06/07/2023, interpretation pending.  HA's less frequent lately. Waiting on approval for Aimovig.  He is frustrated with years of IBS sx's and requests GI referral.   Past Medical History:  Diagnosis Date   Alcoholism in recovery (HCC)    Asthma    Bilateral hand pain    cymbalta  VERY helpful but caused intolerable nausea.  Meloxicam ok   Bilateral headaches 01/27/2015   neuro->low serotonin level->cymbalta   Epiploic appendagitis    09/2020   Fibromyalgia    rheum eval 2024   GAD (generalized anxiety disorder)    GERD (gastroesophageal reflux disease)    hx esophagitis   Hearing loss    IBS (irritable bowel syndrome)    Laryngopharyngeal reflux (LPR)    POTS (postural orthostatic tachycardia syndrome) 02/13/2016   florinef no help.  Propranolol helpful   Restless legs syndrome    Seasonal allergies    Vertigo    2018, central-->MRI and MRA brain normal    Past Surgical History:  Procedure Laterality Date   CARDIOVASCULAR STRESS TEST     12/2015 myoview normal   ESOPHAGOGASTRODUODENOSCOPY     02/06/2020: Esophagitis, erosive gastro duodenopathy   INGUINAL HERNIA REPAIR     bilat as a teen   SINUS ENDO W/FUSION     20s   WISDOM TOOTH EXTRACTION      Outpatient Medications Prior to Visit  Medication Sig Dispense Refill   ALBUTEROL IN Inhale 1-2 puffs into the lungs as needed.     BLACK CURRANT SEED OIL PO Take by mouth.     budesonide-formoterol (SYMBICORT) 160-4.5 MCG/ACT inhaler Inhale 2 puffs into the lungs 2 (two) times daily. 1 each 0   Erenumab-aooe (AIMOVIG) 140 MG/ML SOAJ Every 30 days subcutaneously 1 mL 5   meloxicam (MOBIC) 15 MG tablet 1 tab po qd 30 tablet 5   metoCLOPramide (REGLAN) 10 MG tablet 1 tab po bid with meals 60 tablet 1   omeprazole (  PRILOSEC) 40 MG capsule 1 cap po bid 180 capsule 1   propranolol (INDERAL) 10 MG tablet 1 tab po qd 90 tablet 1   Rimegepant Sulfate (NURTEC) 75 MG TBDP Prn po migraine     rizatriptan (MAXALT) 5 MG tablet Take 1 tablet (5 mg total) by mouth as needed for migraine. May repeat in 2 hours if needed 10 tablet 1   S-Adenosylmethionine (SAME PO) Take by mouth.     venlafaxine XR (EFFEXOR-XR) 37.5 MG 24 hr capsule TAKE 1 CAPSULE BY MOUTH DAILY WITH BREAKFAST. 30 capsule 0   predniSONE (DELTASONE) 5 MG  tablet Take 15 mg po 3 days, than 10 mg 3 days  then 5 mg for the rest of the pack po in AM (Patient not taking: Reported on 06/13/2023) 30 tablet 0   No facility-administered medications prior to visit.    Allergies  Allergen Reactions   Amoxicillin    Cymbalta [Duloxetine Hcl] Nausea Only   Biaxin [Clarithromycin] Hives   Ceftin [Cefuroxime Axetil] Hives   Penicillins Hives    Has patient had a PCN reaction causing immediate rash, facial/tongue/throat swelling, SOB or lightheadedness with hypotension: Yes Has patient had a PCN reaction causing severe rash involving mucus membranes or skin necrosis: Yes Has patient had a PCN reaction that required hospitalization: No Has patient had a PCN reaction occurring within the last 10 years: No If all of the above answers are "NO", then may proceed with Cephalosporin use.    Tetracyclines & Related Hives    Review of Systems As per HPI  PE:    06/13/2023    3:38 PM 05/18/2023    2:17 PM 05/04/2023    3:44 PM  Vitals with BMI  Height  5\' 11"    Weight 185 lbs 10 oz 184 lbs 180 lbs  BMI  25.67   Systolic 115  113  Diastolic 76  79  Pulse 70  80     Physical Exam  Gen: Alert, well appearing.  Patient is oriented to person, place, time, and situation. AFFECT: pleasant, lucid thought and speech. No further exam today.  LABS:  Last CBC Lab Results  Component Value Date   WBC 8.2 04/30/2022   HGB 14.6 04/30/2022   HCT 41.1 04/30/2022   MCV 90.3 04/30/2022   MCH 32.1 04/30/2022   RDW 13.1 04/30/2022   PLT 262 04/30/2022   Last metabolic panel Lab Results  Component Value Date   GLUCOSE 95 05/04/2023   NA 139 05/04/2023   K 3.5 05/04/2023   CL 101 05/04/2023   CO2 26 05/04/2023   BUN 17 05/04/2023   CREATININE 0.97 05/04/2023   GFR 101.71 05/04/2023   CALCIUM 9.9 05/04/2023   PROT 7.3 05/04/2023   ALBUMIN 5.0 05/04/2023   BILITOT 0.6 05/04/2023   ALKPHOS 64 05/04/2023   AST 15 05/04/2023   ALT 28 05/04/2023    ANIONGAP 11 11/03/2020   Last thyroid functions Lab Results  Component Value Date   TSH 1.17 04/30/2022   Lab Results  Component Value Date   ESRSEDRATE 5 05/04/2023   Lab Results  Component Value Date   CRP <1.0 05/04/2023   Lab Results  Component Value Date   ANA POSITIVE (A) 05/04/2023  1: 40 titer  IMPRESSION AND PLAN:  1) Headache syndrome, atypical. Baseline HA's nonmigraine, recent superimposed analgesic rebound headaches. Better after seeing neuro and getting steroid taper and he is awaiting insurance approval for Aimovig 140mg  q month.  Maxalt for  abortive purposes. MRI brain has been done, results pending.  2) SOB, cough.   Suspect he has some small airways dz. CT chest imaging reassuring. Sx's essentially quiescent at this time. Cont symbicort 160/4.5 two p bid  3) Chronic postprandial diarrhea. Pt requests GI referral-->ordered today.  An After Visit Summary was printed and given to the patient.  FOLLOW UP: No follow-ups on file.  Signed:  Santiago Bumpers, MD           06/13/2023

## 2023-06-14 ENCOUNTER — Encounter: Payer: Self-pay | Admitting: Gastroenterology

## 2023-06-16 ENCOUNTER — Other Ambulatory Visit (HOSPITAL_COMMUNITY): Payer: Self-pay

## 2023-06-17 ENCOUNTER — Encounter: Payer: Self-pay | Admitting: Neurology

## 2023-06-23 ENCOUNTER — Encounter: Payer: Self-pay | Admitting: Family Medicine

## 2023-06-23 ENCOUNTER — Encounter: Payer: Self-pay | Admitting: Neurology

## 2023-06-29 ENCOUNTER — Telehealth: Payer: Self-pay

## 2023-06-29 ENCOUNTER — Other Ambulatory Visit (HOSPITAL_COMMUNITY): Payer: Self-pay

## 2023-06-29 NOTE — Telephone Encounter (Signed)
 Pharmacy Patient Advocate Encounter   Received notification from Physician's Office that prior authorization for Aimovig 140mg /ml autoinjector is required/requested.   Insurance verification completed.   The patient is insured through [facility] .   Per test claim: PA required; PA submitted to above mentioned insurance via CoverMyMeds Key/confirmation #/EOC [id number] Status is pending

## 2023-06-30 ENCOUNTER — Encounter: Payer: Self-pay | Admitting: Family Medicine

## 2023-06-30 NOTE — Telephone Encounter (Signed)
 Noted.

## 2023-07-07 ENCOUNTER — Other Ambulatory Visit (HOSPITAL_COMMUNITY): Payer: Self-pay

## 2023-07-07 ENCOUNTER — Telehealth: Payer: Self-pay | Admitting: Pharmacist

## 2023-07-07 NOTE — Telephone Encounter (Signed)
 Looks like pt qualifies for coverage after reviewing . Can you submit appeal?  1.Pt has tried/failed: topiramate, venlafaxine, duloxetine  2. Not taking with any other CGRP  3. He reports a daily headache for the past 6 months.

## 2023-07-07 NOTE — Telephone Encounter (Signed)
 Pharmacy Patient Advocate Encounter  Received notification from [facility] that Prior Authorization for Aimovig 140MG /ML auto-injectors has been DENIED.  Full denial letter will be uploaded to the media tab. See denial reason below.   PA #/Case ID/Reference #: PA Case ID #: [id number]  Will forward to our Pharmacist for appeals review.

## 2023-07-07 NOTE — Telephone Encounter (Signed)
 E-Appeal has been submitted for Aimovig. Will advise when response is received, please be advised that most companies may take 30 days to make a decision. Appeal letter and supporting documentation have been uploaded and submitted to [facility] website on 07/07/2023 @2 :25 pm.  Thank you, Dellie Burns, PharmD Clinical Pharmacist  [facility]  Direct Dial: [phone]

## 2023-07-08 NOTE — Telephone Encounter (Signed)
 Forwarded to Pharmacist for appeals review.

## 2023-07-11 NOTE — Telephone Encounter (Signed)
 Additional information has been requested from the patient's insurance in order to proceed with the appeal. Requested information has been sent, or form has been filled out and faxed back to [phone]

## 2023-07-22 ENCOUNTER — Other Ambulatory Visit (HOSPITAL_COMMUNITY): Payer: Self-pay

## 2023-07-27 ENCOUNTER — Ambulatory Visit: Payer: Commercial Managed Care - HMO | Admitting: Neurology

## 2023-07-28 ENCOUNTER — Encounter: Payer: Self-pay | Admitting: Gastroenterology

## 2023-07-28 ENCOUNTER — Ambulatory Visit: Admitting: Gastroenterology

## 2023-07-28 VITALS — BP 120/76 | HR 70 | Ht 71.0 in | Wt 184.4 lb

## 2023-07-28 DIAGNOSIS — M2550 Pain in unspecified joint: Secondary | ICD-10-CM

## 2023-07-28 DIAGNOSIS — K2100 Gastro-esophageal reflux disease with esophagitis, without bleeding: Secondary | ICD-10-CM

## 2023-07-28 DIAGNOSIS — R1084 Generalized abdominal pain: Secondary | ICD-10-CM | POA: Diagnosis not present

## 2023-07-28 DIAGNOSIS — K529 Noninfective gastroenteritis and colitis, unspecified: Secondary | ICD-10-CM

## 2023-07-28 DIAGNOSIS — R195 Other fecal abnormalities: Secondary | ICD-10-CM

## 2023-07-28 MED ORDER — FAMOTIDINE 40 MG PO TABS: 40.0000 mg | ORAL_TABLET | Freq: Every day | ORAL | 3 refills | Status: DC

## 2023-07-28 MED ORDER — NA SULFATE-K SULFATE-MG SULF 17.5-3.13-1.6 GM/177ML PO SOLN: 1.0000 | Freq: Once | ORAL | 0 refills | Status: AC

## 2023-07-28 NOTE — Patient Instructions (Addendum)
 A high fiber diet with plenty of fluids (up to 8 glasses of water daily) is suggested to relieve these symptoms.  benefiber, 1 tablespoon once or twice daily can be used to keep bowels regular if needed.   We have sent the following medications to your pharmacy for you to pick up at your convenience:  Fomotodine  You have been scheduled for an endoscopy and colonoscopy. Please follow the written instructions given to you at your visit today.  If you use inhalers (even only as needed), please bring them with you on the day of your procedure.  DO NOT TAKE 7 DAYS PRIOR TO TEST- Trulicity (dulaglutide) Ozempic, Wegovy (semaglutide) Mounjaro (tirzepatide) Bydureon Bcise (exanatide extended release)  DO NOT TAKE 1 DAY PRIOR TO YOUR TEST Rybelsus (semaglutide) Adlyxin (lixisenatide) Victoza (liraglutide) Byetta (exanatide) ___________________________________________________________________________

## 2023-07-28 NOTE — Progress Notes (Signed)
 Chief Complaint: Chronic diarrhea and abdominal pain Primary GI MD: Dr. Sandrea Cruel  HPI: Discussed the use of AI scribe software for clinical note transcription with the patient, who gave verbal consent to proceed.  History of Present Illness Dustin [name] "Dustin [name]" is a 33 year old male with chronic diarrhea and abdominal pain who presents for evaluation of persistent gastrointestinal symptoms.  He has experienced chronic diarrhea and abdominal pain since childhood. Bowel movements are predominantly loose, occurring 80-90% of the time, with a consistency described as 'a little soupier' than mashed potatoes. Symptoms typically occur in the morning upon waking and sometimes after meals, regardless of the type of food consumed. Larger meals exacerbate symptoms, causing stomach pain within 30 minutes, followed by a need to use the bathroom again. Pain is relieved after bowel movements, although there is often an urge to go again shortly after.  He has undergone extensive testing in the past, including normal lab tests for thyroid  function, celiac disease, complete blood count, and metabolic panel. A CT scan performed in 2022 was also normal. He recalls being told in high school that his symptoms might be due to IBS, but no further investigations were pursued at that time. He was scheduled for a colonoscopy in 2021, but it was not completed. He did have an endoscopy, which was done for GERD and dysphagia and family history of esophageal cancer in his father.  Showed reflux esophagitis.  He experiences joint pain, described as an 'overnight' onset, affecting all his joints. He recalls a severe episode in high school where his joints, including knees and hands, became inflamed, leading to hospitalization. He is currently taking meloxicam  for joint pain, which his mother also experienced and treated with the same medication. He has been tested for HLA B27 in the past which was negative.  He has a history of  reflux, for which he takes omeprazole  40 mg twice daily. Despite this, he continues to experience symptoms. He has not tried other medications like pantoprazole  or lansoprazole. No family history of inflammatory bowel disease or colon cancer, although his father had esophageal cancer in his forties.  He mentions persistent swelling of lymph nodes on the left side of his body, which he feels are inflamed and cause throat soreness. He has been evaluated by an ENT in 2015, who found the lymph nodes to be within normal range.   Past Medical History:  Diagnosis Date   Alcoholism in recovery (HCC)    Asthma    Bilateral hand pain    cymbalta  VERY helpful but caused intolerable nausea.  Meloxicam  ok   Bilateral headaches 01/27/2015   neuro->low serotonin level->cymbalta .  Multiple normal MRI brain   Epiploic appendagitis    09/2020   Fibromyalgia    rheum eval 2024   GAD (generalized anxiety disorder)    GERD (gastroesophageal reflux disease)    hx esophagitis   Hearing loss    IBS (irritable bowel syndrome)    Laryngopharyngeal reflux (LPR)    POTS (postural orthostatic tachycardia syndrome) 02/13/2016   florinef  no help.  Propranolol  helpful   Restless legs syndrome    Seasonal allergies    Vertigo    2018, central-->MRI and MRA brain normal    Past Surgical History:  Procedure Laterality Date   CARDIOVASCULAR STRESS TEST     12/2015 myoview  normal   ESOPHAGOGASTRODUODENOSCOPY     02/06/2020: Esophagitis, erosive gastro duodenopathy   INGUINAL HERNIA REPAIR     bilat as a teen  SINUS ENDO W/FUSION     20s   WISDOM TOOTH EXTRACTION      Current Outpatient Medications  Medication Sig Dispense Refill   ALBUTEROL IN Inhale 1-2 puffs into the lungs as needed.     BLACK CURRANT SEED OIL PO Take by mouth.     budesonide -formoterol  (SYMBICORT ) 160-4.5 MCG/ACT inhaler Inhale 2 puffs into the lungs 2 (two) times daily. 1 each 0   famotidine  (PEPCID ) 40 MG tablet Take 1 tablet (40 mg  total) by mouth at bedtime. 30 tablet 3   meloxicam  (MOBIC ) 15 MG tablet 1 tab po qd 30 tablet 5   metoCLOPramide  (REGLAN ) 10 MG tablet 1 tab po bid with meals 60 tablet 1   Na Sulfate-K Sulfate-Mg Sulfate concentrate (SUPREP) 17.5-3.13-1.6 GM/177ML SOLN Take 1 kit (354 mLs total) by mouth once for 1 dose. 354 mL 0   omeprazole  (PRILOSEC) 40 MG capsule 1 cap po bid (Patient taking differently: Take 40 mg by mouth 2 (two) times daily. 1 cap po bid) 180 capsule 1   propranolol  (INDERAL ) 10 MG tablet 1 tab po qd 90 tablet 1   rizatriptan  (MAXALT ) 5 MG tablet Take 1 tablet (5 mg total) by mouth as needed for migraine. May repeat in 2 hours if needed 10 tablet 1   S-Adenosylmethionine (SAME PO) Take by mouth.     Erenumab -aooe (AIMOVIG ) 140 MG/ML SOAJ Every 30 days subcutaneously (Patient not taking: Reported on 07/28/2023) 1 mL 5   venlafaxine  XR (EFFEXOR -XR) 37.5 MG 24 hr capsule TAKE 1 CAPSULE BY MOUTH DAILY WITH BREAKFAST. (Patient not taking: Reported on 07/28/2023) 30 capsule 0   No current facility-administered medications for this visit.    Allergies as of 07/28/2023 - Review Complete 07/28/2023  Allergen Reaction Noted   Amoxicillin   04/14/2020   Cymbalta  [duloxetine  hcl] Nausea Only 02/04/2023   Biaxin [clarithromycin] Hives 09/10/2014   Ceftin [cefuroxime axetil] Hives 09/10/2014   Penicillins Hives 05/14/2014   Tetracyclines & related Hives 05/14/2014    Family History  Problem Relation Age of Onset   Migraines Mother    Arthritis Mother    Asthma Mother    Depression Mother    Hypertension Mother    Esophageal cancer Father 57   Early death Father    Arthritis Maternal Grandmother    Cancer Maternal Grandmother    Lung cancer Paternal Grandfather    Breast cancer Paternal Aunt     Social History   Socioeconomic History   Marital status: Married    Spouse name: Dave Erie   Number of children: 1   Years of education: [employer]   Highest education level: Not on file   Occupational History   Not on file  Tobacco Use   Smoking status: Former    Current packs/day: 0.00    Average packs/day: 0.5 packs/day for 15.0 years (7.5 ttl pk-yrs)    Types: Cigarettes    Start date: 2008    Quit date: 04/2021    Years since quitting: 2.3   Smokeless tobacco: Never   Tobacco comments:    Still vapes occasionally  Vaping Use   Vaping status: Former   Substances: Nicotine , Flavoring  Substance and Sexual Activity   Alcohol use: Not Currently    Alcohol/week: 0.0 standard drinks of alcohol   Drug use: No   Sexual activity: Yes    Partners: Female    Birth control/protection: None  Other Topics Concern   Not on file  Social History Narrative  Patient lives at home with his wife [address]).   Educ: HS   Occup: truck driver   +tob   Hx alc abuse   Social Drivers of Corporate investment banker Strain: Not on file  Food Insecurity: Not on file  Transportation Needs: Not on file  Physical Activity: Not on file  Stress: Not on file  Social Connections: Not on file  Intimate Partner Violence: Not on file    Review of Systems:    Constitutional: No weight loss, fever, chills, weakness or fatigue HEENT: Eyes: No change in vision               Ears, Nose, Throat:  No change in hearing or congestion Skin: No rash or itching Cardiovascular: No chest pain, chest pressure or palpitations   Respiratory: No SOB or cough Gastrointestinal: See HPI and otherwise negative Genitourinary: No dysuria or change in urinary frequency Neurological: No headache, dizziness or syncope Musculoskeletal: No new muscle or joint pain Hematologic: No bleeding or bruising Psychiatric: No history of depression or anxiety    Physical Exam:  Vital signs: BP 120/76   Pulse 70   Ht 5\' 11"  (1.803 m)   Wt 184 lb 6 oz (83.6 kg)   SpO2 100%   BMI 25.72 kg/m   Constitutional: NAD, alert and cooperative Head:  Normocephalic and atraumatic. Eyes:   PEERL, EOMI. No icterus.  Conjunctiva pink. Respiratory: Respirations even and unlabored. Lungs clear to auscultation bilaterally.   No wheezes, crackles, or rhonchi.  Cardiovascular:  Regular rate and rhythm. No peripheral edema, cyanosis or pallor.  Gastrointestinal:  Soft, nondistended, nontender. No rebound or guarding. Normal bowel sounds. No appreciable masses or hepatomegaly. Rectal:  Declines Msk:  Symmetrical without gross deformities. Without edema, no deformity or joint abnormality.  Neurologic:  Alert and  oriented x4;  grossly normal neurologically.  Skin:   Dry and intact without significant lesions or rashes. Psychiatric: Oriented to person, place and time. Demonstrates good judgement and reason without abnormal affect or behaviors.   RELEVANT LABS AND IMAGING: CBC    Component Value Date/Time   WBC 8.2 04/30/2022 1551   RBC 4.55 04/30/2022 1551   HGB 14.6 04/30/2022 1551   HCT 41.1 04/30/2022 1551   PLT 262 04/30/2022 1551   MCV 90.3 04/30/2022 1551   MCH 32.1 04/30/2022 1551   MCHC 35.5 04/30/2022 1551   RDW 13.1 04/30/2022 1551   LYMPHSABS 3,288 04/30/2022 1551   MONOABS 0.5 12/27/2019 1647   EOSABS 246 04/30/2022 1551   BASOSABS 57 04/30/2022 1551    CMP     Component Value Date/Time   NA 139 05/04/2023 1622   K 3.5 05/04/2023 1622   CL 101 05/04/2023 1622   CO2 26 05/04/2023 1622   GLUCOSE 95 05/04/2023 1622   BUN 17 05/04/2023 1622   CREATININE 0.97 05/04/2023 1622   CREATININE 1.00 04/30/2022 1551   CALCIUM 9.9 05/04/2023 1622   PROT 7.3 05/04/2023 1622   ALBUMIN 5.0 05/04/2023 1622   AST 15 05/04/2023 1622   ALT 28 05/04/2023 1622   ALKPHOS 64 05/04/2023 1622   BILITOT 0.6 05/04/2023 1622   GFRNONAA >60 11/03/2020 1734   GFRAA >60 01/05/2017 1200     Assessment/Plan:    Assessment & Plan   GERD Father with esophageal cancer in his 4s.  EGD 02/2020 for dysphagia/GERD with LA grade B reflux esophagitis without bleeding, erosive gastropathy, duodenal lesions  (biopsies negative for intestinal metaplasia or H. pylori) with  continued to have breakthrough GERD despite omeprazole  40 Mg twice daily.  Excessive meloxicam  use for arthritis.  DDx includes esophagitis, gastritis, PUD. - Repeat EGD for further evaluation - I thoroughly discussed the procedure with the patient (at bedside) to include nature of the procedure, alternatives, benefits, and risks (including but not limited to bleeding, infection, perforation, anesthesia/cardiac pulmonary complications).  Patient verbalized understanding and gave verbal consent to proceed with procedure. - Addition of famotidine  40 Mg once daily to current antacid regimen - Educated patient on lifestyle modifications  Chronic diarrhea Generalized abdominal pain Normal CBC, CMP, CRP, ESR, TSH, celiac.  Colonoscopy recommended in 2021 but not pursued.  CT abdomen pelvis with contrast 2022 unrevealing.  Continued loose stools worse with eating better after bowel movement (80% loose/20% formed) also association of joint pain.  DDx includes IBS versus IBD.  No family history of IBD.  No previous colonoscopy.  Normal inflammatory markers. - Trial of fiber (Benefiber/Citrucel). - Schedule colonoscopy for further evaluation - I thoroughly discussed the procedure with the patient (at bedside) to include nature of the procedure, alternatives, benefits, and risks (including but not limited to bleeding, infection, perforation, anesthesia/cardiac pulmonary complications).  Patient verbalized understanding and gave verbal consent to proceed with procedure.   Assigned to Dr. Bridgett Camps based on scheduling  Starkeisha Vanwinkle [facility] 07/28/2023, 3:29 PM  Cc: McGowen, Minetta Aly, MD

## 2023-07-29 ENCOUNTER — Other Ambulatory Visit (HOSPITAL_COMMUNITY): Payer: Self-pay

## 2023-07-29 NOTE — Telephone Encounter (Signed)
   Pharmacy Patient Advocate Encounter  Received notification from  Danney Dutton  that Prior Authorization for AImovig  has been APPROVED from 07/29/2023 to 01/28/2024. Unable to obtain price due to refill too soon rejection, last fill date 07/29/2023 next available fill date5/18/2025   PA #/Case ID/Reference #: [id number]

## 2023-08-04 ENCOUNTER — Telehealth: Payer: Self-pay | Admitting: Neurology

## 2023-08-04 ENCOUNTER — Ambulatory Visit: Payer: Self-pay

## 2023-08-04 ENCOUNTER — Encounter: Payer: Self-pay | Admitting: Internal Medicine

## 2023-08-04 NOTE — Telephone Encounter (Signed)
 Pt is asking for a call to discuss issues he is having while back on propranolol  (INDERAL ) 10 MG tablet : brain fog, feeling of being cold when generally he is a hot natured person, sever migraines, dizziness, feeling of tiredness and weakness, please call pt to discuss

## 2023-08-08 NOTE — Telephone Encounter (Signed)
 Called the patient and he states that he was started on the propranolol   in January and he started devloping some side effects about 3 weeks into it. He had tried toughing it out but they were getting worse. He took his last dose on 08/04/23 am. States since stopping the medication he has already noted improvement in side effects.  His Aimovig  was approved but has not started taking the aimovig  yet. States that he will start that. Advised this can take up to 3 months before being effective. Advised the pt to keep headache journal and see if once he starts the once a month injection, if he notes improvement. Pt states that he wanted to inform Dr Albertina Hugger that he still has the brain fog feeling. When questioned about this he states its not losing train of thought or anything like that its more a pressure he feels on the head. MRI was normal. Advised I would inform MD and see if she has other thoughts or recommendations. He was appreciative for the call back.

## 2023-08-11 ENCOUNTER — Ambulatory Visit: Admitting: Internal Medicine

## 2023-08-11 ENCOUNTER — Encounter: Payer: Self-pay | Admitting: Internal Medicine

## 2023-08-11 ENCOUNTER — Other Ambulatory Visit: Payer: Self-pay | Admitting: Internal Medicine

## 2023-08-11 ENCOUNTER — Telehealth: Payer: Self-pay

## 2023-08-11 ENCOUNTER — Other Ambulatory Visit (HOSPITAL_COMMUNITY): Payer: Self-pay

## 2023-08-11 VITALS — BP 110/71 | HR 58 | Temp 97.7°F | Resp 11 | Ht 71.0 in | Wt 184.0 lb

## 2023-08-11 DIAGNOSIS — R195 Other fecal abnormalities: Secondary | ICD-10-CM | POA: Diagnosis not present

## 2023-08-11 DIAGNOSIS — K2289 Other specified disease of esophagus: Secondary | ICD-10-CM

## 2023-08-11 DIAGNOSIS — R1084 Generalized abdominal pain: Secondary | ICD-10-CM

## 2023-08-11 DIAGNOSIS — K2100 Gastro-esophageal reflux disease with esophagitis, without bleeding: Secondary | ICD-10-CM

## 2023-08-11 MED ORDER — LANSOPRAZOLE 30 MG PO CPDR: 30.0000 mg | DELAYED_RELEASE_CAPSULE | Freq: Two times a day (BID) | ORAL | 3 refills | Status: DC

## 2023-08-11 MED ORDER — DEXLANSOPRAZOLE 60 MG PO CPDR: 60.0000 mg | DELAYED_RELEASE_CAPSULE | Freq: Every day | ORAL | 1 refills | Status: DC

## 2023-08-11 MED ORDER — SODIUM CHLORIDE 0.9 % IV SOLN
500.0000 mL | Freq: Once | INTRAVENOUS | Status: DC
Start: 2023-08-11 — End: 2023-08-11

## 2023-08-11 NOTE — Op Note (Signed)
 [facility] Patient Name: Dustin [name] Procedure Date: 08/11/2023 9:41 AM MRN: [id number] Endoscopist: Nannette Babe , MD, [id number] Age: 33 Referring MD:  Date of Birth: [birth year] Gender: Male Account #: [id number] Procedure:                Upper GI endoscopy Indications:              Heartburn despite omeprazole  40 mg BID, Follow-up                            of reflux esophagitis, Family history of esophageal                            cancer Medicines:                Monitored Anesthesia Care Procedure:                Pre-Anesthesia Assessment:                           - Prior to the procedure, a History and Physical                            was performed, and patient medications and                            allergies were reviewed. The patient's tolerance of                            previous anesthesia was also reviewed. The risks                            and benefits of the procedure and the sedation                            options and risks were discussed with the patient.                            All questions were answered, and informed consent                            was obtained. Prior Anticoagulants: The patient has                            taken no anticoagulant or antiplatelet agents. ASA                            Grade Assessment: II - A patient with mild systemic                            disease. After reviewing the risks and benefits,                            the patient was deemed in satisfactory condition to  undergo the procedure.                           After obtaining informed consent, the endoscope was                            passed under direct vision. Throughout the                            procedure, the patient's blood pressure, pulse, and                            oxygen saturations were monitored continuously. The                            Olympus Scope SN [id number] was introduced through  the                            mouth, and advanced to the second part of duodenum.                            The upper GI endoscopy was accomplished without                            difficulty. The patient tolerated the procedure                            well. Scope In: Scope Out: Findings:                 In the proximal esophagus there was a small inlet                            patch without nodularity or visible abnormality.                           The examined esophagus was normal. Biopsies were                            taken with a cold forceps for histology to evaluate                            for reflux inflammation. No endoscopic evidence of                            esophagitis or Barrett's esophagus.                           The entire examined stomach was normal.                           The examined duodenum was normal. Biopsies for                            histology were taken with a cold forceps for  evaluation of celiac disease. Complications:            No immediate complications. Estimated Blood Loss:     Estimated blood loss was minimal. Impression:               - Normal esophagus. Biopsied to evaluate for                            uncontrolled reflux.                           - Normal stomach.                           - Normal examined duodenum. Biopsied to exclude                            celiac disease. Recommendation:           - Patient has a contact number available for                            emergencies. The signs and symptoms of potential                            delayed complications were discussed with the                            patient. Return to normal activities tomorrow.                            Written discharge instructions were provided to the                            patient.                           - Resume previous diet.                           - Continue present medications.  Ongoing GERD                            symptoms despite BID omeprazole  and previous use of                            BID pantoprazole .                           - Stop omeprazole  and begin trial of Dexilant 60 mg                            once daily.                           - Await pathology results.                           -  If reflux persists despite Dexilant please                            contact my office for follow-up and further                            recommendations. Nannette Babe, MD 08/11/2023 10:39:29 AM This report has been signed electronically.

## 2023-08-11 NOTE — Telephone Encounter (Signed)
 Dr. Bridgett Camps,  I apologize. Please see the rx alternative request sent by the pharmacy. They are stating we can send in lansoprazole 30 mg once daily, not bid since it is not covered by insurance. Is it ok?

## 2023-08-11 NOTE — Telephone Encounter (Signed)
 He has tried pantoprazole  as documented in my procedure note from today Can sub lansoprazole 30 mg BID-AC, please inform pt why we are making this change (insurance requirement)

## 2023-08-11 NOTE — Progress Notes (Signed)
 See office note dated 07/28/2023 for details and current H&P  Patient presenting for upper and lower endoscopy to evaluate GERD, family history of esophageal cancer in the patient's father, personal history of LA grade B esophagitis with breakthrough GERD symptoms despite twice daily omeprazole  and chronic diarrhea with generalized abdominal pain  Remains appropriate for [facility] upper and lower endoscopy

## 2023-08-11 NOTE — Addendum Note (Signed)
 Addended by: Glennette Lanius on: 08/11/2023 12:43 PM   Modules accepted: Orders

## 2023-08-11 NOTE — Patient Instructions (Signed)
 Resume previous diet.  Continue present medications. Stop Omeprazole  and begin trial of Dexilant 60mg  once daily.  Await pathology results.  If reflux persists despite Dexilant please contact my office for follow-up and further recommendations.  Repeat colonoscopy at age 33 for screening purposes.   YOU HAD AN ENDOSCOPIC PROCEDURE TODAY AT THE [facility]:   Refer to the procedure report that was given to you for any specific questions about what was found during the examination.  If the procedure report does not answer your questions, please call your gastroenterologist to clarify.  If you requested that your care partner not be given the details of your procedure findings, then the procedure report has been included in a sealed envelope for you to review at your convenience later.  YOU SHOULD EXPECT: Some feelings of bloating in the abdomen. Passage of more gas than usual.  Walking can help get rid of the air that was put into your GI tract during the procedure and reduce the bloating. If you had a lower endoscopy (such as a colonoscopy or flexible sigmoidoscopy) you may notice spotting of blood in your stool or on the toilet paper. If you underwent a bowel prep for your procedure, you may not have a normal bowel movement for a few days.  Please Note:  You might notice some irritation and congestion in your nose or some drainage.  This is from the oxygen used during your procedure.  There is no need for concern and it should clear up in a day or so.  SYMPTOMS TO REPORT IMMEDIATELY:  Following lower endoscopy (colonoscopy or flexible sigmoidoscopy):  Excessive amounts of blood in the stool  Significant tenderness or worsening of abdominal pains  Swelling of the abdomen that is new, acute  Fever of 100F or higher  Following upper endoscopy (EGD)  Vomiting of blood or coffee ground material  New chest pain or pain under the shoulder blades  Painful or persistently difficult  swallowing  New shortness of breath  Fever of 100F or higher  Black, tarry-looking stools  For urgent or emergent issues, a gastroenterologist can be reached at any hour by calling [phone]. Do not use MyChart messaging for urgent concerns.    DIET:  We do recommend a small meal at first, but then you may proceed to your regular diet.  Drink plenty of fluids but you should avoid alcoholic beverages for 24 hours.  ACTIVITY:  You should plan to take it easy for the rest of today and you should NOT DRIVE or use heavy machinery until tomorrow (because of the sedation medicines used during the test).    FOLLOW UP: Our staff will call the number listed on your records the next business day following your procedure.  We will call around 7:15- 8:00 am to check on you and address any questions or concerns that you may have regarding the information given to you following your procedure. If we do not reach you, we will leave a message.     If any biopsies were taken you will be contacted by phone or by letter within the next 1-3 weeks.  Please call us  at [phone] if you have not heard about the biopsies in 3 weeks.    SIGNATURES/CONFIDENTIALITY: You and/or your care partner have signed paperwork which will be entered into your electronic medical record.  These signatures attest to the fact that that the information above on your After Visit Summary has been reviewed and is understood.  Full responsibility of the confidentiality of this discharge information lies with you and/or your care-partner.

## 2023-08-11 NOTE — Op Note (Signed)
 [facility] Patient Name: Dustin [name] Procedure Date: 08/11/2023 9:38 AM MRN: [id number] Endoscopist: Nannette Babe , MD, [id number] Age: 33 Referring MD:  Date of Birth: Sep 06, 1990 Gender: Male Account #: [id number] Procedure:                Colonoscopy Indications:              Generalized abdominal pain and loose stools Medicines:                Monitored Anesthesia Care Procedure:                Pre-Anesthesia Assessment:                           - Prior to the procedure, a History and Physical                            was performed, and patient medications and                            allergies were reviewed. The patient's tolerance of                            previous anesthesia was also reviewed. The risks                            and benefits of the procedure and the sedation                            options and risks were discussed with the patient.                            All questions were answered, and informed consent                            was obtained. Prior Anticoagulants: The patient has                            taken no anticoagulant or antiplatelet agents. ASA                            Grade Assessment: II - A patient with mild systemic                            disease. After reviewing the risks and benefits,                            the patient was deemed in satisfactory condition to                            undergo the procedure.                           After obtaining informed consent, the colonoscope  was passed under direct vision. Throughout the                            procedure, the patient's blood pressure, pulse, and                            oxygen saturations were monitored continuously. The                            Olympus Scope SN [id number] was introduced through the                            anus and advanced to the terminal ileum. The                            Olympus Scope SN  [id number] was introduced through the                            and advanced to the. The colonoscopy was performed                            without difficulty. The patient tolerated the                            procedure well. The quality of the bowel                            preparation was good. The terminal ileum, ileocecal                            valve, appendiceal orifice, and rectum were                            photographed. Scope In: 10:21:45 AM Scope Out: 10:34:19 AM Scope Withdrawal Time: 0 hours 10 minutes 42 seconds  Total Procedure Duration: 0 hours 12 minutes 34 seconds  Findings:                 The digital rectal exam was normal.                           The terminal ileum appeared normal.                           The entire examined colon appeared normal on direct                            and retroflexion views. Complications:            No immediate complications. Estimated Blood Loss:     Estimated blood loss: none. Impression:               - The examined portion of the ileum was normal.                           -  The entire examined colon is normal on direct and                            retroflexion views.                           - No specimens collected. Recommendation:           - Patient has a contact number available for                            emergencies. The signs and symptoms of potential                            delayed complications were discussed with the                            patient. Return to normal activities tomorrow.                            Written discharge instructions were provided to the                            patient.                           - Resume previous diet.                           - Continue present medications.                           - Repeat colonoscopy at age 41 for screening                            purposes. Nannette Babe, MD 08/11/2023 10:42:08 AM This report has been signed  electronically.

## 2023-08-11 NOTE — Telephone Encounter (Signed)
 I have spoken to patient to advise of insurance requirement for him to first try and fail omeprazole , pantoprazole  and lansoprazole before they will cover Dexilant. Advised that we will send lansoprazole to his pharmacy for him to take twice daily before meals. Asked that he call our office should he find that lansoprazole twice daily dosing is ineffective after taking for 1-2 weeks. He verbalizes understanding.

## 2023-08-11 NOTE — Telephone Encounter (Signed)
 Received a notification from the pharmacy stating the insurance company will not cover bid dosing, will only cover omeprazole  once daily. Dr. Bridgett Camps, can I change the prescription to once daily dosing?

## 2023-08-11 NOTE — Progress Notes (Signed)
 To pacu, VSS. Report to [email or web address].tb

## 2023-08-11 NOTE — Telephone Encounter (Signed)
 Pharmacy Patient Advocate Encounter   Received notification from CoverMyMeds that prior authorization for Dexlansoprazole 60MG  dr capsules is required/requested.   Insurance verification completed.   The patient is insured through [facility] .   Per test claim:  Omeprazole , Pantoprazole , and Lansoprazole is preferred by the insurance.  If suggested medication is appropriate, Please send in a new RX and discontinue this one. If not, please advise as to why it's not appropriate so that we may request a Prior Authorization. Please note, some preferred medications may still require a PA.  If the suggested medications have not been trialed and there are no contraindications to their use, the PA will not be submitted, as it will not be approved.

## 2023-08-11 NOTE — Telephone Encounter (Signed)
 Rx Auth Request Received: Today Interface, Surescripts Out  [facility] Rx Refill Caller: Unspecified (Today,  1:14 PM)      Changes Requested   omeprazole  (PRILOSEC) 20 MG capsule       Changed from: lansoprazole (PREVACID) 30 MG capsule   Sig: N/A   Disp: Not specified    Refills: 0   Start: 08/11/2023   Class: Normal   Last ordered: Today (08/11/2023) by Nannette Babe, MD   Last refill: 08/11/2023   Rx #: [id number]   Pharmacy comment: Alternative Requested:INSURANCE WILL NOT PAY FOR BID.  ONLY COVERS 1 CAP DAILY.     [facility] at[id number] [place], [place] - [address]

## 2023-08-11 NOTE — Progress Notes (Signed)
 Called to room to assist during endoscopic procedure.  Patient ID and intended procedure confirmed with present staff. Received instructions for my participation in the procedure from the performing physician.

## 2023-08-11 NOTE — Telephone Encounter (Signed)
Dr [facility]rt[facility], please advise.... 

## 2023-08-12 ENCOUNTER — Telehealth: Payer: Self-pay

## 2023-08-12 MED ORDER — LANSOPRAZOLE 30 MG PO CPDR: 30.0000 mg | DELAYED_RELEASE_CAPSULE | Freq: Every day | ORAL | 3 refills | Status: DC

## 2023-08-12 NOTE — Addendum Note (Signed)
 Addended by: MADAN, Aralynn Brake L on: 08/12/2023 11:50 AM   Modules accepted: Orders

## 2023-08-12 NOTE — Telephone Encounter (Signed)
 Yes, but he needs to let us  know if not controlled so that we can request higher dose or different med from his insurance company

## 2023-08-12 NOTE — Telephone Encounter (Signed)
 Informed patient that his insurance will not cover lansoprazole  twice daily so we are going to send in once daily dosing. Also, to try this for 1-2 weeks and if his symptoms are not under control, contact our office and we can then re-submit the PA for Dexlansoprazole  60 mg daily. Patient states he had a issue previously with getting his omeprazole  bid dosing covered and had to pay out of pocket for it. Patient states he will contact our office in 1-2 weeks if symptoms are not under control.

## 2023-08-12 NOTE — Telephone Encounter (Signed)
  Follow up Call-     08/11/2023    8:51 AM  Call back number  Post procedure Call Back phone  # [phone]  Permission to leave phone message Yes     Patient questions:  Do you have a fever, pain , or abdominal swelling? No. Pain Score  0 *  Have you tolerated food without any problems? Yes.    Have you been able to return to your normal activities? Yes.    Do you have any questions about your discharge instructions: Diet   No. Medications  No. Follow up visit  No.  Do you have questions or concerns about your Care? No.  Actions: * If pain score is 4 or above: No action needed, pain <4.

## 2023-08-15 ENCOUNTER — Telehealth: Payer: Self-pay

## 2023-08-15 ENCOUNTER — Other Ambulatory Visit (HOSPITAL_COMMUNITY): Payer: Self-pay

## 2023-08-15 LAB — SURGICAL PATHOLOGY

## 2023-08-15 NOTE — Telephone Encounter (Signed)
 Pharmacy Patient Advocate Encounter   Received notification from Pt Calls Messages that prior authorization for Dexlansoprazole  60MG  dr capsules is required/requested.   Insurance verification completed.   The patient is insured through [facility] .   Per test claim: PA required; PA submitted to above mentioned insurance via CoverMyMeds Key/confirmation #/EOC [id number] Status is pending

## 2023-08-15 NOTE — Telephone Encounter (Signed)
 Spoke with patient and he expressed the lansoprazole  did not help his reflux symptoms at all. Patient reports he went back on the omeprazole .  Patient has not tried and failed all the preferred alternatives: omeprazole , pantoprazole  and lansoprazole . Please resubmit the PA for Dexilant . Thanks.

## 2023-08-15 NOTE — Telephone Encounter (Signed)
 Inbound call from patient, states lansoprazole  is not working. Patient states his Acid reflux is still bothersome, and would like to speak to [place].

## 2023-08-16 ENCOUNTER — Ambulatory Visit: Payer: Self-pay

## 2023-08-16 ENCOUNTER — Other Ambulatory Visit (HOSPITAL_COMMUNITY): Payer: Self-pay

## 2023-08-16 DIAGNOSIS — K2100 Gastro-esophageal reflux disease with esophagitis, without bleeding: Secondary | ICD-10-CM

## 2023-08-16 MED ORDER — VOQUEZNA 10 MG PO TABS: 10.0000 mg | ORAL_TABLET | Freq: Every day | ORAL | 3 refills | Status: DC

## 2023-08-18 ENCOUNTER — Telehealth: Payer: Self-pay | Admitting: Pharmacist

## 2023-08-18 ENCOUNTER — Other Ambulatory Visit (HOSPITAL_COMMUNITY): Payer: Self-pay

## 2023-08-18 NOTE — Telephone Encounter (Signed)
 An E-Appeal has been submitted for dexlansoprazole . Will advise when response is received, please be advised that most companies may take 30 days to make a decision. Appeal letter and supporting documentation have been uploaded and submitted via CMM on 08/18/2023 @3 :12 pm.    Thank you, Dene Fines, PharmD Clinical Pharmacist  [place]  Direct Dial: [phone]

## 2023-08-18 NOTE — Telephone Encounter (Signed)
 Pharmacy Patient Advocate Encounter  Received notification from [facility] that Prior Authorization for Dexlansoprazole  60MG  dr capsules has been DENIED.  Full denial letter will be uploaded to the media tab. See denial reason below.  The criteria for medical necessity have not been met. Your current records with us  and the information submitted by your provider do not meet the following criteria:  (1) trial and failure for at least one month or unable to use two of the following: lansoprazole , omeprazole , and pantoprazole   PA #/Case ID/Reference #: [id number]

## 2023-08-23 ENCOUNTER — Telehealth: Payer: Self-pay | Admitting: Internal Medicine

## 2023-08-23 ENCOUNTER — Other Ambulatory Visit: Payer: Self-pay | Admitting: Internal Medicine

## 2023-08-23 DIAGNOSIS — K2100 Gastro-esophageal reflux disease with esophagitis, without bleeding: Secondary | ICD-10-CM

## 2023-08-23 MED ORDER — VOQUEZNA 10 MG PO TABS: 10.0000 mg | ORAL_TABLET | Freq: Every day | ORAL | 3 refills | Status: DC

## 2023-08-23 NOTE — Telephone Encounter (Signed)
 PT is calling to ask if [facility] can be sent to [facility] [place] and not the [facility]. He stated [facility]

## 2023-08-23 NOTE — Telephone Encounter (Signed)
 Prescription sent to patient's pharmacy per patient's request.

## 2023-08-24 ENCOUNTER — Other Ambulatory Visit (HOSPITAL_COMMUNITY): Payer: Self-pay

## 2023-08-25 NOTE — Progress Notes (Signed)
 Addendum: Reviewed and agree with assessment and management plan. Asha Grumbine, Carie Caddy, MD

## 2023-08-30 ENCOUNTER — Encounter: Payer: Self-pay | Admitting: Family Medicine

## 2023-08-30 ENCOUNTER — Ambulatory Visit: Admitting: Family Medicine

## 2023-08-30 ENCOUNTER — Other Ambulatory Visit (HOSPITAL_COMMUNITY): Payer: Self-pay

## 2023-08-30 VITALS — BP 124/82 | HR 80 | Temp 98.4°F | Ht 71.0 in | Wt 185.4 lb

## 2023-08-30 DIAGNOSIS — G5692 Unspecified mononeuropathy of left upper limb: Secondary | ICD-10-CM

## 2023-08-30 DIAGNOSIS — G5693 Unspecified mononeuropathy of bilateral upper limbs: Secondary | ICD-10-CM | POA: Diagnosis not present

## 2023-08-30 DIAGNOSIS — G5691 Unspecified mononeuropathy of right upper limb: Secondary | ICD-10-CM

## 2023-08-30 DIAGNOSIS — R209 Unspecified disturbances of skin sensation: Secondary | ICD-10-CM

## 2023-08-30 DIAGNOSIS — G5792 Unspecified mononeuropathy of left lower limb: Secondary | ICD-10-CM | POA: Diagnosis not present

## 2023-08-30 NOTE — Progress Notes (Signed)
 OFFICE VISIT  08/30/2023  CC:  Chief Complaint  Patient presents with   Leg Pain    Left leg radiating down foot, causing numbness; alternating heat and ice.     Patient is a 33 y.o. male who presents for left leg pain.  HPI: Lately he has been having some new discomfort in the left leg.  He starts feeling pain at the lateral aspect near the fibular head and it radiates a little bit up for but mostly down the left calf posterolaterally. It is worse while sitting, takes a while to walk it off after getting up. Also having some numbness on and off in both arms, left worse than right.  If he rests his elbow on something it triggers it.  Also notes his arms fall asleep easily when he sleeps with them stretched out over his head.  No recent overuse or trauma.  ROS as above, plus--> no fevers, no CP, no SOB, no wheezing, no cough, no dizziness, no HAs, no rashes, no melena/hematochezia.  No polyuria or polydipsia.  He does have some diffuse arthralgias, particularly both hands.  No focal weakness, no tremors.  No acute vision or hearing abnormalities.  No dysuria or unusual/new urinary urgency or frequency.  No lower extremity swelling.. No n/v/d or abd pain.  No palpitations.    Past Medical History:  Diagnosis Date   Alcoholism in recovery [facility])    Allergy     Arthritis    Asthma    Bilateral hand pain    cymbalta  VERY helpful but caused intolerable nausea.  Meloxicam  ok   Bilateral headaches 01/27/2015   neuro->low serotonin level->cymbalta .  Multiple normal MRI brain   Epiploic appendagitis    09/2020   Fibromyalgia    rheum eval 2024   GAD (generalized anxiety disorder)    GERD (gastroesophageal reflux disease)    hx esophagitis   Hearing loss    IBS (irritable bowel syndrome)    Laryngopharyngeal reflux (LPR)    POTS (postural orthostatic tachycardia syndrome) 02/13/2016   florinef  no help.  Propranolol  helpful   Restless legs syndrome    Seasonal allergies    Vertigo     2018, central-->MRI and MRA brain normal    Past Surgical History:  Procedure Laterality Date   CARDIOVASCULAR STRESS TEST     12/2015 myoview  normal   COLONOSCOPY     08/11/23 NORMAL   ESOPHAGOGASTRODUODENOSCOPY     02/06/2020: Esophagitis, erosive gastro duodenopathy.  08/11/23 GER-->changed from bid omep to bid dexilant .  Duodenal bx normal.   INGUINAL HERNIA REPAIR     bilat as a teen   SINUS ENDO W/FUSION     20s   WISDOM TOOTH EXTRACTION      Outpatient Medications Prior to Visit  Medication Sig Dispense Refill   ALBUTEROL IN Inhale 1-2 puffs into the lungs as needed.     BLACK CURRANT SEED OIL PO Take by mouth.     budesonide -formoterol  (SYMBICORT ) 160-4.5 MCG/ACT inhaler Inhale 2 puffs into the lungs 2 (two) times daily. 1 each 0   lansoprazole  (PREVACID ) 30 MG capsule Take 1 capsule (30 mg total) by mouth daily. Pharmacy-please d/c rx for Dexilant . Insurance will not cover. 30 capsule 3   meloxicam  (MOBIC ) 15 MG tablet 1 tab po qd 30 tablet 5   metoCLOPramide  (REGLAN ) 10 MG tablet 1 tab po bid with meals 60 tablet 1   propranolol  (INDERAL ) 10 MG tablet 1 tab po qd 90 tablet 1   propranolol   ER (INDERAL  LA) 80 MG 24 hr capsule Take 80 mg by mouth at bedtime.     rizatriptan  (MAXALT ) 5 MG tablet Take 1 tablet (5 mg total) by mouth as needed for migraine. May repeat in 2 hours if needed 10 tablet 1   S-Adenosylmethionine (SAME PO) Take by mouth.     venlafaxine  XR (EFFEXOR -XR) 37.5 MG 24 hr capsule TAKE 1 CAPSULE BY MOUTH DAILY WITH BREAKFAST. 30 capsule 0   Vonoprazan Fumarate  (VOQUEZNA ) 10 MG TABS Take 10 mg by mouth daily. 30 tablet 3   Erenumab -aooe (AIMOVIG ) 140 MG/ML SOAJ Every 30 days subcutaneously (Patient not taking: Reported on 07/28/2023) 1 mL 5   famotidine  (PEPCID ) 40 MG tablet Take 1 tablet (40 mg total) by mouth at bedtime. (Patient not taking: Reported on 08/30/2023) 30 tablet 3   No facility-administered medications prior to visit.    Allergies  Allergen  Reactions   Amoxicillin  Nausea And Vomiting   Cymbalta  [Duloxetine  Hcl] Nausea Only   Biaxin [Clarithromycin] Hives   Ceftin [Cefuroxime Axetil] Hives   Penicillins Hives    Has patient had a PCN reaction causing immediate rash, facial/tongue/throat swelling, SOB or lightheadedness with hypotension: Yes Has patient had a PCN reaction causing severe rash involving mucus membranes or skin necrosis: Yes Has patient had a PCN reaction that required hospitalization: No Has patient had a PCN reaction occurring within the last 10 years: No If all of the above answers are "NO", then may proceed with Cephalosporin use.    Tetracyclines & Related Hives    Review of Systems  As per HPI  PE:    08/30/2023   10:52 AM 08/11/2023   10:59 AM 08/11/2023   10:49 AM  Vitals with BMI  Height 5\' 11"     Weight 185 lbs 6 oz    BMI 25.87    Systolic 124 110 94  Diastolic 82 71 55  Pulse 80 58 62     Physical Exam  Gen: Alert, well appearing.  Patient is oriented to person, place, time, and situation. AFFECT: pleasant, lucid thought and speech. Tinel's sign at the elbow negative bilaterally.  No ulnar nerve subluxation detected.  Range of motion of the elbows is fully intact and there is no swelling, tenderness, or erythema.  Wrist and fingers motion and strength normal bilaterally.  Sensation fully intact bilaterally. There is question of mild enlargement of the ulnar nerve in the cubital tunnel bilaterally on [facility] bedside ultrasound today. No tenderness or swelling of the left leg.  Symptoms cannot be reproduced with flexion or extension of the knee or with firm palpation around the fibular head. No left ankle weakness.  LABS:  Last CBC Lab Results  Component Value Date   WBC 8.2 04/30/2022   HGB 14.6 04/30/2022   HCT 41.1 04/30/2022   MCV 90.3 04/30/2022   MCH 32.1 04/30/2022   RDW 13.1 04/30/2022   PLT 262 04/30/2022   Last metabolic panel Lab Results  Component Value Date   GLUCOSE 95  05/04/2023   NA 139 05/04/2023   K 3.5 05/04/2023   CL 101 05/04/2023   CO2 26 05/04/2023   BUN 17 05/04/2023   CREATININE 0.97 05/04/2023   GFR 101.71 05/04/2023   CALCIUM 9.9 05/04/2023   PROT 7.3 05/04/2023   ALBUMIN 5.0 05/04/2023   BILITOT 0.6 05/04/2023   ALKPHOS 64 05/04/2023   AST 15 05/04/2023   ALT 28 05/04/2023   ANIONGAP 11 11/03/2020   IMPRESSION AND PLAN:  #  1 sensory disturbance of both arms and of left leg. 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 I recommended he see a neurologist for consideration of NCS/EMG.  Referral ordered today.  An After Visit Summary was printed and given to the patient.  FOLLOW UP: Return for as needed.  Signed:  Arletha Lady, MD           08/30/2023

## 2023-09-01 ENCOUNTER — Telehealth: Payer: Self-pay

## 2023-09-01 ENCOUNTER — Other Ambulatory Visit (HOSPITAL_COMMUNITY): Payer: Self-pay

## 2023-09-01 DIAGNOSIS — K2100 Gastro-esophageal reflux disease with esophagitis, without bleeding: Secondary | ICD-10-CM

## 2023-09-01 NOTE — Telephone Encounter (Signed)
 Pharmacy Patient Advocate Encounter   Received notification from CoverMyMeds that prior authorization for Voquezna  10MG  tablets is required/requested.   Insurance verification completed.   The patient is insured through [facility] .   Per test claim: PA required; PA submitted to above mentioned insurance via CoverMyMeds Key/confirmation #/EOC [id number] Status is pending

## 2023-09-02 NOTE — Telephone Encounter (Signed)
 Pharmacy Patient Advocate Encounter  Received notification from [facility] that Prior Authorization for Voquezna  10MG  tablets has been DENIED.  Full denial letter will be uploaded to the media tab. See denial reason below.  The plan's medical necessity criteria for non-formulary drugs has not been met. This medication is non-formulary which means it is not a preferred medication.  Non-formulary drugs are not available for coverage before trying covered preferred medications including: lansoprazole , omeprazole , pantoprazole , rabeprazole, esomeprazole, dexlansoprazole , cimetidine, famotidine , and nizatidine. Before being considered for approval for a non-formulary drug, your records must show a trial and failure, or inability to use ALL the preferred medications listed above.   PA #/Case ID/Reference #: [id number]

## 2023-09-08 NOTE — Telephone Encounter (Signed)
 Insurance has approved the appeal for dexlansoprazole  through [id number].      Thank you, Dene Fines, PharmD Clinical Pharmacist  [place]  Direct Dial: [phone]

## 2023-09-15 ENCOUNTER — Ambulatory Visit: Admitting: Family Medicine

## 2023-09-19 ENCOUNTER — Other Ambulatory Visit: Payer: Self-pay | Admitting: Family Medicine

## 2023-09-22 MED ORDER — DEXLANSOPRAZOLE 60 MG PO CPDR: 60.0000 mg | DELAYED_RELEASE_CAPSULE | Freq: Every day | ORAL | 1 refills | Status: AC

## 2023-09-22 NOTE — Telephone Encounter (Signed)
 Rec'd fax from [facility] that PA for Dexilant   was approved in May.  Called patient.  He has never tried Dexilant . He was told Voquezna  would be $40 a month and he said that was a lot more then omeprazole  that he had been on $24 month.  He indicates Omeprazole  was working but not great. He was taking 40mg  in the am and 40mg  in the pm.  Called [facility] re: Voquezna .  They indicated that with a cash assistant price adjustment he would pay $50 for a 30 day supply. [facility] and they said he would pay $30 for a 90 day supply of the Dexilant .  Called patient.  He said he would like to try the Dexilant .  [facility] .  Note: [facility] said they will put his Voquezna  script on hold and he can call down the road to request that it be filled at that price for a year,

## 2023-09-22 NOTE — Addendum Note (Signed)
 Addended by: Ebrahim Deremer M on: 09/22/2023 12:37 PM   Modules accepted: Orders

## 2023-10-01 ENCOUNTER — Telehealth: Payer: Self-pay | Admitting: Nurse Practitioner

## 2023-10-01 NOTE — Telephone Encounter (Signed)
 Patient called answering service . Constipated . Hasn't had a BM in a few days Taking 1-2 stool softeners a day.   Recommend 3 capfuls of miralax in 32 oz of water today. Alternatively can purge more slowly with 1 capful in 8 oz water Q 8 hours x 3 doses. Miralax   Patient mentions his wife noticed some yellow streaks in his eyes ( now resolved). Urine is normal color, no itching. Recommended he contact PCP if yellow streaks in eyes return

## 2023-10-02 NOTE — Telephone Encounter (Signed)
 Created in error

## 2023-11-03 ENCOUNTER — Encounter: Payer: Self-pay | Admitting: Family Medicine

## 2023-11-10 ENCOUNTER — Encounter: Payer: Self-pay | Admitting: Family Medicine

## 2023-11-10 ENCOUNTER — Ambulatory Visit: Admitting: Family Medicine

## 2023-11-10 VITALS — BP 114/74 | HR 85 | Temp 98.0°F | Ht 71.0 in | Wt 187.4 lb

## 2023-11-10 DIAGNOSIS — L602 Onychogryphosis: Secondary | ICD-10-CM | POA: Diagnosis not present

## 2023-11-10 NOTE — Progress Notes (Signed)
 OFFICE VISIT  11/10/2023  CC:  Chief Complaint  Patient presents with   Toe Concern    Hx of ingrown toe nail on right second toe,     Patient is a 33 y.o. male who presents for toe concern.  HPI: Left foot second toe nail thick and growing sideways. Has been doing this for a year or 2, ever since getting the nail removed for the same thing. It does not hurt him. He thinks he may have injured it once in the remote past kicking a tire while wearing a steel toed boot.   Past Medical History:  Diagnosis Date   Alcoholism in recovery (HCC)    Allergy     Arthritis    Asthma    Bilateral hand pain    cymbalta  VERY helpful but caused intolerable nausea.  Meloxicam  ok   Bilateral headaches 01/27/2015   neuro->low serotonin level->cymbalta .  Multiple normal MRI brain   Epiploic appendagitis    09/2020   Fibromyalgia    rheum eval 2024   GAD (generalized anxiety disorder)    GERD (gastroesophageal reflux disease)    hx esophagitis   Hearing loss    IBS (irritable bowel syndrome)    Laryngopharyngeal reflux (LPR)    POTS (postural orthostatic tachycardia syndrome) 02/13/2016   florinef  no help.  Propranolol  helpful   Restless legs syndrome    Seasonal allergies    Vertigo    2018, central-->MRI and MRA brain normal    Past Surgical History:  Procedure Laterality Date   CARDIOVASCULAR STRESS TEST     12/2015 myoview  normal   COLONOSCOPY     08/11/23 NORMAL   ESOPHAGOGASTRODUODENOSCOPY     02/06/2020: Esophagitis, erosive gastro duodenopathy.  08/11/23 GER-->changed from bid omep to bid dexilant .  Duodenal bx normal.   INGUINAL HERNIA REPAIR     bilat as a teen   SINUS ENDO W/FUSION     20s   WISDOM TOOTH EXTRACTION      Outpatient Medications Prior to Visit  Medication Sig Dispense Refill   ALBUTEROL IN Inhale 1-2 puffs into the lungs as needed.     BLACK CURRANT SEED OIL PO Take by mouth.     budesonide -formoterol  (SYMBICORT ) 160-4.5 MCG/ACT inhaler Inhale 2 puffs into  the lungs 2 (two) times daily. 1 each 0   dexlansoprazole  (DEXILANT ) 60 MG capsule Take 1 capsule (60 mg total) by mouth daily. 90 capsule 1   lansoprazole  (PREVACID ) 30 MG capsule Take 1 capsule (30 mg total) by mouth daily. Pharmacy-please d/c rx for Dexilant . Insurance will not cover. 30 capsule 3   meloxicam  (MOBIC ) 15 MG tablet 1 tab po qd 30 tablet 5   metoCLOPramide  (REGLAN ) 10 MG tablet 1 tab po bid with meals 60 tablet 1   propranolol  (INDERAL ) 10 MG tablet 1 tab po qd 90 tablet 1   propranolol  ER (INDERAL  LA) 80 MG 24 hr capsule Take 80 mg by mouth at bedtime.     rizatriptan  (MAXALT ) 5 MG tablet Take 1 tablet (5 mg total) by mouth as needed for migraine. May repeat in 2 hours if needed 10 tablet 1   S-Adenosylmethionine (SAME PO) Take by mouth.     venlafaxine  XR (EFFEXOR -XR) 37.5 MG 24 hr capsule TAKE 1 CAPSULE BY MOUTH DAILY WITH BREAKFAST. 30 capsule 0   Erenumab -aooe (AIMOVIG ) 140 MG/ML SOAJ Every 30 days subcutaneously (Patient not taking: Reported on 11/10/2023) 1 mL 5   famotidine  (PEPCID ) 40 MG tablet Take 1 tablet (40  mg total) by mouth at bedtime. (Patient not taking: Reported on 11/10/2023) 30 tablet 3   No facility-administered medications prior to visit.    Allergies  Allergen Reactions   Amoxicillin  Nausea And Vomiting   Cymbalta  [Duloxetine  Hcl] Nausea Only   Biaxin [Clarithromycin] Hives   Ceftin [Cefuroxime Axetil] Hives   Penicillins Hives    Has patient had a PCN reaction causing immediate rash, facial/tongue/throat swelling, SOB or lightheadedness with hypotension: Yes Has patient had a PCN reaction causing severe rash involving mucus membranes or skin necrosis: Yes Has patient had a PCN reaction that required hospitalization: No Has patient had a PCN reaction occurring within the last 10 years: No If all of the above answers are NO, then may proceed with Cephalosporin use.    Tetracyclines & Related Hives    Review of Systems  As per HPI  PE:     11/10/2023    3:49 PM 08/30/2023   10:52 AM 08/11/2023   10:59 AM  Vitals with BMI  Height 5' 11 5' 11   Weight 187 lbs 6 oz 185 lbs 6 oz   BMI 26.15 25.87   Systolic 114 124 889  Diastolic 74 82 71  Pulse 85 80 58    Physical Exam  General: Alert and well-appearing Left foot second toe with hypertrophic nail, angulated and slightly brittle.  LABS:  None  IMPRESSION AND PLAN:  Onychogryphosis, left foot second toenail. Will refer to podiatry to see if they will remove the nail and apply phenol to deter nail regrowth.  An After Visit Summary was printed and given to the patient.  FOLLOW UP: No follow-ups on file.  Signed:  Gerlene Hockey, MD           11/10/2023

## 2023-11-15 ENCOUNTER — Ambulatory Visit: Payer: 59 | Admitting: Neurology

## 2023-11-24 ENCOUNTER — Encounter: Payer: Self-pay | Admitting: Podiatry

## 2023-11-24 ENCOUNTER — Ambulatory Visit: Admitting: Podiatry

## 2023-11-24 DIAGNOSIS — L600 Ingrowing nail: Secondary | ICD-10-CM

## 2023-11-24 NOTE — Progress Notes (Signed)
 Patient presents today with complaint of a painful toenail that is very thick and curved on the second toe right.  Has been bothering him for several years and it is getting worse.  Bothers him a lot when trying to wear steel toed shoes.  Not has not noticed any drainage.  2   Physical exam:  General appearance: Pleasant, and in no acute distress. AOx3.  Vascular: Pedal pulses: DP 2/4 bilaterally, PT 2/4 bilaterally.  No edema lower legs bilaterally. Capillary fill time immediate bilaterally.  Neurological: Light touch intact feet bilaterally.  Normal Achilles reflex bilaterally.  No clonus or spasticity noted.   Dermatologic:   Thick dystrophic severely incurvated and gryphotic nail that twist laterally on the second toe right tenderness with pressure on the nail.  Skin normal temperature bilaterally.  Skin normal color, tone, and texture bilaterally.   Musculoskeletal: Elongated hammertoe second toe right.  Normal muscle strength lower leg bilaterally    Diagnosis: 1.  Ingrown dystrophic nails second toe right.  Plan: -New patient office visit level 3 for evaluation and management. - Discussed in the ingrown nail.  Recommended permanent removal nail.  Discussed was involved in this.  Discussed procedure.  In the meantime if he has any discomfort with that he can soak it 15 minutes in water several times a day.  I will get him scheduled in a week to do this   Return 1 week nail surgery second toe right

## 2023-11-25 ENCOUNTER — Ambulatory Visit: Payer: Self-pay | Admitting: *Deleted

## 2023-11-25 NOTE — Telephone Encounter (Signed)
 FYI Only or Action Required?: Action required by provider: request for appointment.earlier appt with PCP instead of Monday   Patient was last seen in primary care on 11/10/2023 by McGowen, Aleene DEL, MD.  Called Nurse Triage reporting Bleeding/Bruising. Nose bleeds takes approx 10 minutes to stop, pressure between eyes with nose bleeds.  Symptoms began several days ago.  Interventions attempted: Rest, hydration, or home remedies.  Symptoms are: gradually worsening.  Triage Disposition: See Physician Within 24 Hours  Patient/caregiver understands and will follow disposition?: Yes             Copied from CRM #[id number]. Topic: Clinical - Red Word Triage >> Nov 25, 2023 11:18 AM Ahlexyia S wrote: [facility] that prompted transfer to Nurse Triage: Pt stated he is having severe nose bleeds and there is pain on the top of his nose. When asked how long pt said 3 days so 3 nights in a row. Warm transferred to nurse triage. Reason for Disposition  [1] Bleeding recurs 3 or more times in 24 hours AND [2] direct pressure applied correctly  Answer Assessment - Initial Assessment Questions No nose bleed now no dizziness now. Earliest appt available and scheduled for 11/28/23 with other provider. Recommended if sx worsening or lightheaded occurs again go to ED. Please advise if patient can be seen today per request.       1. AMOUNT OF BLEEDING: How bad is the bleeding? How much blood was lost? Has the bleeding stopped?     Nose bleeds x 3 nights every 5-530 am . No bleed now  2. ONSET: When did the nosebleed start?      Approx 3 day ago  3. FREQUENCY: How many nosebleeds have you had in the last 24 hours?      1 4. RECURRENT SYMPTOMS: Have there been other recent nosebleeds? If Yes, ask: How long did it take you to stop the bleeding? What worked best?      Yes has been happening past 3 nights every 5-530 am pressure noted upper area of nose between eyes. Nose bleeds lasted  approx 10 minutes 5. CAUSE: What do you think caused this nosebleed?     Not sure  6. LOCAL FACTORS: Do you have any cold symptoms?, Have you been rubbing or picking at your nose?     No  waking up middle , early part of  night and noted 7. SYSTEMIC FACTORS: Do you have high blood pressure or any bleeding problems?     No  8. BLOOD THINNERS: Do you take any blood thinners? (e.g., aspirin, clopidogrel / Plavix, coumadin, heparin). Notes: Other strong blood thinners include: Arixtra (fondaparinux), Eliquis (apixaban), Pradaxa (dabigatran), and Xarelto (rivaroxaban).     na 9. OTHER SYMPTOMS: Do you have any other symptoms? (e.g., lightheadedness)     Mild dizziness at times but none now. No bleeding now. Reports pressure upper nose between eyes with nose bleeds. 10. PREGNANCY: Is there any chance you are pregnant? When was your last menstrual period?       na  Protocols used: Nosebleed-A-AH

## 2023-11-28 ENCOUNTER — Ambulatory Visit: Admitting: Family Medicine

## 2023-11-28 ENCOUNTER — Encounter: Payer: Self-pay | Admitting: Family Medicine

## 2023-11-28 DIAGNOSIS — R040 Epistaxis: Secondary | ICD-10-CM

## 2023-11-28 DIAGNOSIS — J0111 Acute recurrent frontal sinusitis: Secondary | ICD-10-CM | POA: Diagnosis not present

## 2023-11-28 MED ORDER — LEVOFLOXACIN 500 MG PO TABS: 500.0000 mg | ORAL_TABLET | Freq: Every day | ORAL | 0 refills | Status: AC

## 2023-11-28 NOTE — Progress Notes (Signed)
 Dustin [name] , 10/18/1990, 33 y.o., male MRN: [id number] Patient Care Team    Relationship Specialty Notifications Start End  McGowen, Aleene DEL, MD PCP - General Family Medicine  04/30/22   Dohmeier, Dedra, MD Consulting Physician Neurology  04/22/22   Aneita Gwendlyn DASEN, MD (Inactive) Consulting Physician Gastroenterology  04/29/22   Meade Verdon RAMAN, MD Consulting Physician Pulmonary Disease  04/29/22   Raford Riggs, MD Consulting Physician Cardiology  04/29/22   Dolphus Reiter, MD Consulting Physician Rheumatology  06/13/23     Chief Complaint  Patient presents with   Epistaxis    3 days in a row. Pt has not had any more nose bleeds since Saturday but c/o of pressure around his temples. Pt has taken Tylenol.      Subjective: Dustin [name] is a 33 y.o. Pt presents for an OV with complaints of increasing nosebleeds over the last week, he reports 3 nosebleeds last week.  Last nosebleed was Thursday.  He reports he has experienced nosebleeds frequently throughout his life, but he has never had 3 nosebleeds-3 nights in a row.  States it occurs about every night around 5 AM.  The first 2 nights he was awoken with a sensation of a bleeding nose he was able to get it stopped after about 10 minutes.  No heavy bleeding or blood clotting.  The third night he pinched the bridge of his nose and bent over I was able to get it stopped in about 10 seconds.  He has had on and off dizziness since that time, but has had vertigo symptoms throughout his life.  He has had chronic sinusitis requiring sinus surgery in the past. He denies fevers or chills.  He reports he has noticed more increasing frontal sinus pressure since his nosebleeds.   He did turn on the humidifier in his room on Friday.      11/09/2022    9:45 AM 07/23/2022    4:03 PM 04/30/2022    3:04 PM 03/14/2020   11:55 AM 01/27/2015   10:06 AM  Depression screen PHQ 2/9  Decreased Interest 1 0 0 0 0  Down, Depressed, Hopeless 1 0  0 0 0  PHQ - 2 Score 2 0 0 0 0  Altered sleeping 1 2     Tired, decreased energy 1 2     Change in appetite 0 0     Feeling bad or failure about yourself  0 0     Trouble concentrating 0 0     Moving slowly or fidgety/restless 0 0     Suicidal thoughts 0 0     PHQ-9 Score 4 4     Difficult doing work/chores  Somewhat difficult       Allergies  Allergen Reactions   Amoxicillin  Nausea And Vomiting   Cymbalta  [Duloxetine  Hcl] Nausea Only   Biaxin [Clarithromycin] Hives   Ceftin [Cefuroxime Axetil] Hives   Penicillins Hives    Has patient had a PCN reaction causing immediate rash, facial/tongue/throat swelling, SOB or lightheadedness with hypotension: Yes Has patient had a PCN reaction causing severe rash involving mucus membranes or skin necrosis: Yes Has patient had a PCN reaction that required hospitalization: No Has patient had a PCN reaction occurring within the last 10 years: No If all of the above answers are NO, then may proceed with Cephalosporin use.    Tetracyclines & Related Hives   Social History   Social History Narrative  Patient lives at home with his wife Sherolyn).   Educ: HS   Occup: truck driver   +tob   Hx alc abuse   Past Medical History:  Diagnosis Date   Alcoholism in recovery (HCC)    Allergy     Arthritis    Asthma    Bilateral hand pain    cymbalta  VERY helpful but caused intolerable nausea.  Meloxicam  ok   Bilateral headaches 01/27/2015   neuro->low serotonin level->cymbalta .  Multiple normal MRI brain   Epiploic appendagitis    09/2020   Fibromyalgia    rheum eval 2024   GAD (generalized anxiety disorder)    GERD (gastroesophageal reflux disease)    hx esophagitis   Hearing loss    IBS (irritable bowel syndrome)    Laryngopharyngeal reflux (LPR)    POTS (postural orthostatic tachycardia syndrome) 02/13/2016   florinef  no help.  Propranolol  helpful   Restless legs syndrome    Seasonal allergies    Vertigo    2018, central-->MRI and  MRA brain normal   Past Surgical History:  Procedure Laterality Date   CARDIOVASCULAR STRESS TEST     12/2015 myoview  normal   COLONOSCOPY     08/11/23 NORMAL   ESOPHAGOGASTRODUODENOSCOPY     02/06/2020: Esophagitis, erosive gastro duodenopathy.  08/11/23 GER-->changed from bid omep to bid dexilant .  Duodenal bx normal.   INGUINAL HERNIA REPAIR     bilat as a teen   SINUS ENDO W/FUSION     20s   WISDOM TOOTH EXTRACTION     Family History  Problem Relation Age of Onset   Migraines Mother    Arthritis Mother    Asthma Mother    Depression Mother    Hypertension Mother    Esophageal cancer Father 70   Early death Father    Arthritis Maternal Grandmother    Cancer Maternal Grandmother    Lung cancer Paternal Grandfather    Breast cancer Paternal Aunt    Allergies as of 11/28/2023       Reactions   Amoxicillin  Nausea And Vomiting   Cymbalta  [duloxetine  Hcl] Nausea Only   Biaxin [clarithromycin] Hives   Ceftin [cefuroxime Axetil] Hives   Penicillins Hives   Has patient had a PCN reaction causing immediate rash, facial/tongue/throat swelling, SOB or lightheadedness with hypotension: Yes Has patient had a PCN reaction causing severe rash involving mucus membranes or skin necrosis: Yes Has patient had a PCN reaction that required hospitalization: No Has patient had a PCN reaction occurring within the last 10 years: No If all of the above answers are NO, then may proceed with Cephalosporin use.   Tetracyclines & Related Hives        Medication List        Accurate as of November 28, 2023  3:00 PM. If you have any questions, ask your nurse or doctor.          Aimovig  140 MG/ML Soaj Generic drug: Erenumab -aooe Every 30 days subcutaneously   ALBUTEROL IN Inhale 1-2 puffs into the lungs as needed.   BLACK CURRANT SEED OIL PO Take by mouth.   budesonide -formoterol  160-4.5 MCG/ACT inhaler Commonly known as: Symbicort  Inhale 2 puffs into the lungs 2 (two) times daily.    dexlansoprazole  60 MG capsule Commonly known as: DEXILANT  Take 1 capsule (60 mg total) by mouth daily.   famotidine  40 MG tablet Commonly known as: PEPCID  Take 1 tablet (40 mg total) by mouth at bedtime.   lansoprazole  30 MG capsule Commonly known  as: PREVACID  Take 1 capsule (30 mg total) by mouth daily. Pharmacy-please d/c rx for Dexilant . Insurance will not cover.   levofloxacin  500 MG tablet Commonly known as: LEVAQUIN  Take 1 tablet (500 mg total) by mouth daily for 7 days. Started by: Natalie Mceuen   meloxicam  15 MG tablet Commonly known as: MOBIC  1 tab po qd   metoCLOPramide  10 MG tablet Commonly known as: Reglan  1 tab po bid with meals   propranolol  ER 80 MG 24 hr capsule Commonly known as: INDERAL  LA Take 80 mg by mouth at bedtime.   propranolol  10 MG tablet Commonly known as: INDERAL  1 tab po qd   rizatriptan  5 MG tablet Commonly known as: MAXALT  Take 1 tablet (5 mg total) by mouth as needed for migraine. May repeat in 2 hours if needed   SAME PO Take by mouth.   venlafaxine  XR 37.5 MG 24 hr capsule Commonly known as: EFFEXOR -XR TAKE 1 CAPSULE BY MOUTH DAILY WITH BREAKFAST.        All past medical history, surgical history, allergies, family history, immunizations andmedications were updated in the EMR today and reviewed under the history and medication portions of their EMR.     ROS Negative, with the exception of above mentioned in HPI   Objective:  BP 116/78   Pulse 79   Temp 98.1 F (36.7 C)   Wt 188 lb (85.3 kg)   SpO2 98%   BMI 26.22 kg/m  Body mass index is 26.22 kg/m. Physical Exam Vitals and nursing note reviewed. Exam conducted with a chaperone present.  Constitutional:      General: He is not in acute distress.    Appearance: Normal appearance. He is not ill-appearing, toxic-appearing or diaphoretic.  HENT:     Head: Normocephalic and atraumatic.     Right Ear: Tympanic membrane, ear canal and external ear normal.     Left  Ear: Tympanic membrane, ear canal and external ear normal.     Nose: Congestion present. No rhinorrhea.     Right Nostril: No epistaxis, septal hematoma or occlusion.     Left Nostril: No epistaxis, septal hematoma or occlusion.     Right Turbinates: Not enlarged or swollen.     Left Turbinates: Not enlarged or swollen.     Comments: Mild-moderate erythema bilateral Nare with mild purulent drainage present.  Dried blood noted right nostril.  No current bleeding.  Not occluded no hematoma appreciated    Mouth/Throat:     Mouth: Mucous membranes are moist.     Pharynx: No oropharyngeal exudate or posterior oropharyngeal erythema.  Eyes:     General: No scleral icterus.       Right eye: No discharge.        Left eye: No discharge.     Extraocular Movements: Extraocular movements intact.     Pupils: Pupils are equal, round, and reactive to light.  Cardiovascular:     Rate and Rhythm: Normal rate and regular rhythm.     Heart sounds: No murmur heard. Pulmonary:     Effort: Pulmonary effort is normal. No respiratory distress.     Breath sounds: Normal breath sounds. No wheezing, rhonchi or rales.  Skin:    General: Skin is warm and dry.     Coloration: Skin is not jaundiced or pale.     Findings: No rash.  Neurological:     Mental Status: He is alert and oriented to person, place, and time. Mental status is at baseline.  Psychiatric:        Mood and Affect: Mood normal.        Behavior: Behavior normal.        Thought Content: Thought content normal.        Judgment: Judgment normal.      No results found. No results found. No results found for this or any previous visit (from the past 24 hours).  Assessment/Plan: Dustin [name] is a 33 y.o. male present for OV for  Epistaxis/Acute recurrent frontal sinusitis No current bleeding or hematoma appreciated. Nasal saline solution flushes 2-3 times a day encouraged. Levaquin  x 7 days Discussed use of Afrin for acute nosebleeds if  needed-sparingly Follow-up with PCP in 7-14 days, sooner if needed  Reviewed expectations re: course of current medical issues. Discussed self-management of symptoms. Outlined signs and symptoms indicating need for more acute intervention. Patient verbalized understanding and all questions were answered. Patient received an After-Visit Summary.    No orders of the defined types were placed in this encounter.  Meds ordered this encounter  Medications   levofloxacin  (LEVAQUIN ) 500 MG tablet    Sig: Take 1 tablet (500 mg total) by mouth daily for 7 days.    Dispense:  7 tablet    Refill:  0   Referral Orders  No referral(s) requested today     Note is dictated utilizing voice recognition software. Although note has been proof read prior to signing, occasional typographical errors still can be missed. If any questions arise, please do not hesitate to call for verification.   electronically signed by:  Charlies Bellini, DO  [facility] [place]

## 2023-11-28 NOTE — Patient Instructions (Signed)
 Return in about 2 weeks (around 12/12/2023) for w/pcp.        Great to see you today.  I have refilled the medication(s) we provide.   If labs were collected or images ordered, we will inform you of  results once we have received them and reviewed. We will contact you either by echart message, or telephone call.  Please give ample time to the testing facility, and our office to run,  receive and review results. Please do not call inquiring of results, even if you can see them in your chart. We will contact you as soon as we are able. If it has been over 1 week since the test was completed, and you have not yet heard from us , then please call us .    - echart message- for normal results that have been seen by the patient already.   - telephone call: abnormal results or if patient has not viewed results in their echart.  If a referral to a specialist was entered for you, please call us  in 2 weeks if you have not heard from the specialist office to schedule.

## 2023-12-01 ENCOUNTER — Ambulatory Visit (INDEPENDENT_AMBULATORY_CARE_PROVIDER_SITE_OTHER): Admitting: Podiatry

## 2023-12-01 ENCOUNTER — Encounter: Payer: Self-pay | Admitting: Podiatry

## 2023-12-01 DIAGNOSIS — L600 Ingrowing nail: Secondary | ICD-10-CM | POA: Diagnosis not present

## 2023-12-01 NOTE — Patient Instructions (Signed)

## 2023-12-01 NOTE — Progress Notes (Signed)
 Patient complains of painful ingrown gryphotic second nail right. Patient denies fevers, chills, nausea, vomiting.  Objective:  Vitals: Reviewed  General: Well developed, nourished, in no acute distress, alert and oriented x3   Vascular: DP pulse 2/4 bilateral. PT pulse 2/4 bilateral.  No edema  Dermatology: Erythema, edema, incurvated nail border both borders second toe right with severe dystrophic changes with no drainage . Tenderness present with palpation. Normal skin tone and texture feet with normal hair growth.  Neurological: Grossly intact. Normal reflexes.   Musculoskeletal: Tenderness with palpation of the distal second toe right. No tenderness or painful ROM at IPJ.  Diagnosis: Ingrown nail second toe right  Plan: -discussed etiology and treatment of ingrown nails. Discussed surgical vs conservative treatment. -Consent signed for appropriate matrixectomy affected nail(s).   Procedure(s):   - Matrixectomy(s) total second nail right: Toe anesthetized with 3cc 2:1 mixture 2% Lidocaine with epinephrine: Sodium Bicarbonate. Surgical site prepped. Digital tourniquet applied.  Avulsion of nail plate birgit. performed. Matrixecomy performed with three 30 second applications of phenol to nail matrix. Site irrigated with alcohol.  Tourniquet released with good vascularity noticed in digit.  Applied triple antibiotic to nailbed and applied gauze and Coban dressing. - Written and oral postoperative instructions given.  -Return for post-op 2 weeks.  JINNY Prentice Binder, DPM

## 2023-12-07 ENCOUNTER — Ambulatory Visit: Admitting: Family Medicine

## 2023-12-07 ENCOUNTER — Encounter: Payer: Self-pay | Admitting: Family Medicine

## 2023-12-07 VITALS — BP 105/63 | HR 70 | Temp 96.5°F | Ht 71.0 in | Wt 187.2 lb

## 2023-12-07 DIAGNOSIS — R040 Epistaxis: Secondary | ICD-10-CM

## 2023-12-07 DIAGNOSIS — R140 Abdominal distension (gaseous): Secondary | ICD-10-CM | POA: Diagnosis not present

## 2023-12-07 DIAGNOSIS — R197 Diarrhea, unspecified: Secondary | ICD-10-CM | POA: Diagnosis not present

## 2023-12-07 DIAGNOSIS — R519 Headache, unspecified: Secondary | ICD-10-CM

## 2023-12-07 NOTE — Progress Notes (Signed)
 OFFICE VISIT  12/07/2023  CC:  Chief Complaint  Patient presents with   Follow-up    Patient is a 33 y.o. male who presents for pain in forehead  HPI: About a week ago he had a nosebleed.  He put pressure on the nose to make it stop.  Not long after he had pain in the central area of the forehead between the eyebrows. This has resolved. No further nosebleeds since that time. He has put a humidifier in his room now.  No nasal pain.  He is requesting referral to an allergist for food allergy  testing. He has a long history of gastrointestinal symptoms. He does know that he is lactose intolerant but says he has many GI symptoms even when avoiding lactose.  Past Medical History:  Diagnosis Date   Alcoholism in recovery (HCC)    Allergy     Arthritis    Asthma    Bilateral hand pain    cymbalta  VERY helpful but caused intolerable nausea.  Meloxicam  ok   Bilateral headaches 01/27/2015   neuro->low serotonin level->cymbalta .  Multiple normal MRI brain   Epiploic appendagitis    09/2020   Fibromyalgia    rheum eval 2024   GAD (generalized anxiety disorder)    GERD (gastroesophageal reflux disease)    hx esophagitis   Hearing loss    IBS (irritable bowel syndrome)    Laryngopharyngeal reflux (LPR)    POTS (postural orthostatic tachycardia syndrome) 02/13/2016   florinef  no help.  Propranolol  helpful   Restless legs syndrome    Seasonal allergies    Vertigo    2018, central-->MRI and MRA brain normal    Past Surgical History:  Procedure Laterality Date   CARDIOVASCULAR STRESS TEST     12/2015 myoview  normal   COLONOSCOPY     08/11/23 NORMAL   ESOPHAGOGASTRODUODENOSCOPY     02/06/2020: Esophagitis, erosive gastro duodenopathy.  08/11/23 GER-->changed from bid omep to bid dexilant .  Duodenal bx normal.   INGUINAL HERNIA REPAIR     bilat as a teen   SINUS ENDO W/FUSION     20s   WISDOM TOOTH EXTRACTION      Outpatient Medications Prior to Visit  Medication Sig Dispense  Refill   ALBUTEROL IN Inhale 1-2 puffs into the lungs as needed.     BLACK CURRANT SEED OIL PO Take by mouth.     budesonide -formoterol  (SYMBICORT ) 160-4.5 MCG/ACT inhaler Inhale 2 puffs into the lungs 2 (two) times daily. 1 each 0   dexlansoprazole  (DEXILANT ) 60 MG capsule Take 1 capsule (60 mg total) by mouth daily. 90 capsule 1   Erenumab -aooe (AIMOVIG ) 140 MG/ML SOAJ Every 30 days subcutaneously 1 mL 5   famotidine  (PEPCID ) 40 MG tablet Take 1 tablet (40 mg total) by mouth at bedtime. 30 tablet 3   lansoprazole  (PREVACID ) 30 MG capsule Take 1 capsule (30 mg total) by mouth daily. Pharmacy-please d/c rx for Dexilant . Insurance will not cover. 30 capsule 3   meloxicam  (MOBIC ) 15 MG tablet 1 tab po qd 30 tablet 5   metoCLOPramide  (REGLAN ) 10 MG tablet 1 tab po bid with meals 60 tablet 1   propranolol  (INDERAL ) 10 MG tablet 1 tab po qd 90 tablet 1   propranolol  ER (INDERAL  LA) 80 MG 24 hr capsule Take 80 mg by mouth at bedtime.     rizatriptan  (MAXALT ) 5 MG tablet Take 1 tablet (5 mg total) by mouth as needed for migraine. May repeat in 2 hours if needed 10 tablet  1   S-Adenosylmethionine (SAME PO) Take by mouth.     venlafaxine  XR (EFFEXOR -XR) 37.5 MG 24 hr capsule TAKE 1 CAPSULE BY MOUTH DAILY WITH BREAKFAST. 30 capsule 0   No facility-administered medications prior to visit.    Allergies  Allergen Reactions   Amoxicillin  Nausea And Vomiting   Cymbalta  [Duloxetine  Hcl] Nausea Only   Biaxin [Clarithromycin] Hives   Ceftin [Cefuroxime Axetil] Hives   Penicillins Hives    Has patient had a PCN reaction causing immediate rash, facial/tongue/throat swelling, SOB or lightheadedness with hypotension: Yes Has patient had a PCN reaction causing severe rash involving mucus membranes or skin necrosis: Yes Has patient had a PCN reaction that required hospitalization: No Has patient had a PCN reaction occurring within the last 10 years: No If all of the above answers are NO, then may proceed  with Cephalosporin use.    Tetracyclines & Related Hives    Review of Systems  As per HPI  PE:    12/07/2023    3:44 PM 11/28/2023    1:43 PM 11/10/2023    3:49 PM  Vitals with BMI  Height 5' 11  5' 11  Weight 187 lbs 3 oz 188 lbs 187 lbs 6 oz  BMI 26.12 26.23 26.15  Systolic 105 116 885  Diastolic 63 78 74  Pulse 70 79 85     Physical Exam  Gen: Alert, well appearing.  Patient is oriented to person, place, time, and situation. AFFECT: pleasant, lucid thought and speech. Nose: no lesion or erythema or swelling. No fresh or dried blood. Oropharynx normal.  LABS:    IMPRESSION AND PLAN:  #1 epistaxis. Central forehead pain for a while afterward, transient.  Reassured.  Likely from pressure placed over nasal/facial area. Continue humidifier, use saline nasal spray as needed.  2.  Chronic diarrhea and abdominal pain/bloating.  He has seen GI and they felt this was functional/IBS.  Endoscopic eval has been reassuring. Referral to allergy  today for consideration of food allergy  testing.  An After Visit Summary was printed and given to the patient.  FOLLOW UP: Return for As needed.  Signed:  Gerlene Hockey, MD           12/07/2023

## 2023-12-08 ENCOUNTER — Ambulatory Visit (INDEPENDENT_AMBULATORY_CARE_PROVIDER_SITE_OTHER): Admitting: Podiatry

## 2023-12-08 ENCOUNTER — Encounter: Payer: Self-pay | Admitting: Podiatry

## 2023-12-08 DIAGNOSIS — L309 Dermatitis, unspecified: Secondary | ICD-10-CM

## 2023-12-08 MED ORDER — TRIAMCINOLONE ACETONIDE 0.5 % EX CREA: TOPICAL_CREAM | Freq: Two times a day (BID) | CUTANEOUS | 0 refills | Status: DC

## 2023-12-08 NOTE — Progress Notes (Signed)
triam

## 2023-12-08 NOTE — Progress Notes (Signed)
 Presents status post matrixectomy second to nail right.  Doing well at the surgery site however he has developed a rash over the 2nd and 3rd MTPs as well as the proximal third toe right.  He has not been putting any dressing or ointments on the surgery site on the second toe.  Does not remember any contact with new materials.   Physical exam:  General appearance: Pleasant, and in no acute distress. AOx3.  Vascular: Pedal pulses: DP 2/4 bilaterally, PT 2/4 bilaterally.  No edema lower legs bilaterally. Capillary fill time immediate.  Neurological: Grossly intact bilaterally Dermatologic:   Erythematous rash on the dorsal aspect of the 2nd and 3rd MTPs and extending into the medial proximal third toe right.  No rashes present absent on the second toe proper.  Surgery site is on the nailbed is healing up well with no signs of infection.  Normal postoperative appearance  Musculoskeletal:      Diagnosis: 1.  Eczema foot right  Plan: -Established JAP visit for evaluation and management level 3.  Modifier 24. -Addressed to issue separate from the surgery site.  Discussed the rash she has been getting away from the area of the surgery on the foot.  Most likely is some eczema however could be a contact dermatitis of some kind.  Will have him use a corticosteroid cream on it.  Should resolve within 2 weeks.  If not he should call for an appointment. -Rx triamcinolone  cream 0.5%, apply twice daily to affected area foot.  Return as needed

## 2023-12-15 ENCOUNTER — Encounter: Payer: Self-pay | Admitting: Allergy

## 2023-12-15 ENCOUNTER — Ambulatory Visit (INDEPENDENT_AMBULATORY_CARE_PROVIDER_SITE_OTHER): Admitting: Allergy

## 2023-12-15 VITALS — BP 110/68 | HR 62 | Temp 97.9°F | Resp 18 | Ht 71.0 in | Wt 185.8 lb

## 2023-12-15 DIAGNOSIS — T781XXA Other adverse food reactions, not elsewhere classified, initial encounter: Secondary | ICD-10-CM

## 2023-12-15 DIAGNOSIS — Z713 Dietary counseling and surveillance: Secondary | ICD-10-CM | POA: Diagnosis not present

## 2023-12-15 DIAGNOSIS — T781XXD Other adverse food reactions, not elsewhere classified, subsequent encounter: Secondary | ICD-10-CM | POA: Diagnosis not present

## 2023-12-15 DIAGNOSIS — R198 Other specified symptoms and signs involving the digestive system and abdomen: Secondary | ICD-10-CM

## 2023-12-15 NOTE — Patient Instructions (Addendum)
 Discussed with patient that skin prick testing and bloodwork (food IgE levels) checks for IgE mediated reactions which his clinical presentation does not support.  Keep a food journal with symptoms and foods eaten.  Allergy : food allergy  is when you have eaten a food, developed an allergic reaction after eating the food and have IgE to the food (positive food testing either by skin testing or blood testing).  Food allergy  could lead to life threatening symptoms Sensitivity: occurs when you have IgE to a food (positive food testing either by skin testing or blood testing) but is a food you eat without any issues.  This is not an allergy  and we recommend keeping the food in the diet Intolerance: this is when you have negative testing by either skin testing or blood testing thus not allergic but the food causes symptoms (like belly pain, bloating, diarrhea etc) with ingestion.  These foods should be avoided to prevent symptoms.      Get bloodwork to rule out alpha gal allergy  - as that sometimes can present with GI symptoms.  Try low fodmap diet - handout given.  Consider seeing Dr. Zac Spiritos who is a functional GI specialist. [email or web address]  Follow up with me depending on lab results.

## 2023-12-15 NOTE — Progress Notes (Signed)
 New Patient Note  RE: Dustin [name] MRN: [id number] DOB: 1991/01/13 Date of Office Visit: 12/15/2023  Consult requested by: Dustin Aleene DEL, MD Primary care provider: Candise Aleene DEL, MD  Chief Complaint: Allergy  Testing (Pt states that he's been having severe stomach pains, diarrhea, upset. Pt also mention joint inflammations in hand, knees, finger, and swelling in left neck glands)  History of Present Illness: I had the pleasure of seeing Dustin [name] for initial evaluation at the [facility] on 12/15/2023. [name], who is referred here by Dustin [name], Aleene DEL, MD for the evaluation of food allergies.  Discussed the use of AI scribe software for clinical note transcription with the patient, who gave verbal consent to proceed.  Seen by Dr. Iva in 2020 for asthma and chronic rhinitis.     He has experienced gastrointestinal symptoms since childhood, including diarrhea approximately ten minutes after eating and frequent stomach pain. No nausea or vomiting. These symptoms have been consistent over time without recent changes. He has not kept a food diary but notes that most foods, particularly those containing bread and meat, seem to exacerbate his symptoms. He consumes dairy, eggs, nuts, seafood, sesame, wheat, soy, meats, fruits, and vegetables regularly. He has not undergone prior food allergy  testing but has had skin testing for environmental allergies. He has not been on any IBS medication but takes Dexilant  daily for acid reflux, which seems to help.  He experiences inflammation in his joints, including hands, shoulders, elbows, knees, and ankles. Additionally, he reports a persistent sensation of swelling on the left side of his throat, which feels swollen and painful. Previous MRI and scans showed slightly swollen lymph nodes but were otherwise unremarkable. He has seen a rheumatologist for joint inflammation, who told him that testing was  negative for rheumatoid arthritis and mentioned there may be the beginning stages of osteoporosis in his hands.  His asthma is well-controlled, and he has not used his inhaler in the past year. He quit smoking three years ago, which has improved his asthma symptoms. He has medication allergies.   He has a history of migraines and uses Aimovig  once a month, although he has not taken it recently. He maintains a clean home environment with two dogs. No constipation, nausea, vomiting, weight loss, or frequent illnesses. He mentions having POTS syndrome.      12/07/2023 PCP visit: He is requesting referral to an allergist for food allergy  testing. He has a long history of gastrointestinal symptoms. He does know that he is lactose intolerant but says he has many GI symptoms even when avoiding lactose.  07/28/2023 GI visit: GERD Father with esophageal cancer in his 45s.  EGD 02/2020 for dysphagia/GERD with LA grade B reflux esophagitis without bleeding, erosive gastropathy, duodenal lesions (biopsies negative for intestinal metaplasia or H. pylori) with continued to have breakthrough GERD despite omeprazole  40 Mg twice daily.  Excessive meloxicam  use for arthritis.  DDx includes esophagitis, gastritis, PUD. - Repeat EGD for further evaluation - I thoroughly discussed the procedure with the patient (at bedside) to include nature of the procedure, alternatives, benefits, and risks (including but not limited to bleeding, infection, perforation, anesthesia/cardiac pulmonary complications).  Patient verbalized understanding and gave verbal consent to proceed with procedure. - Addition of famotidine  40 Mg once daily to current antacid regimen - Educated patient on lifestyle modifications   Chronic diarrhea Generalized abdominal pain Normal CBC, CMP, CRP, ESR, TSH, celiac.  Colonoscopy recommended in  2021 but not pursued.  CT abdomen pelvis with contrast 2022 unrevealing.  Continued loose stools worse with  eating better after bowel movement (80% loose/20% formed) also association of joint pain.  DDx includes IBS versus IBD.  No family history of IBD.  No previous colonoscopy.  Normal inflammatory markers. - Trial of fiber (Benefiber/Citrucel). - Schedule colonoscopy for further evaluation - I thoroughly discussed the procedure with the patient (at bedside) to include nature of the procedure, alternatives, benefits, and risks (including but not limited to bleeding, infection, perforation, anesthesia/cardiac pulmonary complications).  Patient verbalized understanding and gave verbal consent to proceed with procedure.   Assessment and Plan: Dustin [name] is a 33 y.o. male with: Gastrointestinal complaints Dietary counseling and surveillance Chronic diarrhea and abdominal pain since childhood. Previous GI evaluations including EGD/colonoscopy unremarkable. No IBS medication tried. Current management includes Dexilant  for acid reflux.  Discussed with patient that skin prick testing and bloodwork (food IgE levels) checks for IgE mediated reactions which his clinical presentation does not support.  No indication for any food allergy  skin prick testing at this time.  Keep a food journal with symptoms and foods eaten. Get bloodwork to rule out alpha gal allergy  - as that sometimes can present with GI symptoms. Try low fodmap diet - handout given. Consider seeing Dr. Zac [name] who is a functional GI specialist.  Return if symptoms worsen or fail to improve.  No orders of the defined types were placed in this encounter.  Lab Orders         Alpha-Gal Panel         Tryptase      Other allergy  screening: Asthma: yes Well controlled. No inhaler use the past 1 year.   Rhino conjunctivitis: no Medication allergy : yes Hymenoptera allergy : no Urticaria: no Eczema:no History of recurrent infections suggestive of immunodeficency: no  Diagnostics: None.   Past Medical History: Patient Active Problem  List   Diagnosis Date Noted   ANA positive 05/18/2023   Analgesic rebound headache 05/18/2023   New daily persistent headache 05/18/2023   Anxiety state 05/18/2023   Gastroesophageal reflux disease 12/27/2019   Dysphagia 12/27/2019   Generalized abdominal pain 12/27/2019   Pyrosis 12/27/2019   Diarrhea 02/26/2019   Irritability 12/05/2018   Allergic rhinitis 12/05/2018   POTS (postural orthostatic tachycardia syndrome) 02/13/2016   Epistaxis 10/28/2015   Bilateral headaches 01/27/2015   Past Medical History:  Diagnosis Date   Alcoholism in recovery [facility])    Allergy     Arthritis    Asthma    Bilateral hand pain    cymbalta  VERY helpful but caused intolerable nausea.  Meloxicam  ok   Bilateral headaches 01/27/2015   neuro->low serotonin level->cymbalta .  Multiple normal MRI brain   Epiploic appendagitis    09/2020   Fibromyalgia    rheum eval 2024   GAD (generalized anxiety disorder)    GERD (gastroesophageal reflux disease)    hx esophagitis   Hearing loss    IBS (irritable bowel syndrome)    Laryngopharyngeal reflux (LPR)    POTS (postural orthostatic tachycardia syndrome) 02/13/2016   florinef  no help.  Propranolol  helpful   Restless legs syndrome    Seasonal allergies    Vertigo    2018, central-->MRI and MRA brain normal   Past Surgical History: Past Surgical History:  Procedure Laterality Date   CARDIOVASCULAR STRESS TEST     12/2015 myoview  normal   COLONOSCOPY     08/11/23 NORMAL   ESOPHAGOGASTRODUODENOSCOPY     02/06/2020:  Esophagitis, erosive gastro duodenopathy.  08/11/23 GER-->changed from bid omep to bid dexilant .  Duodenal bx normal.   INGUINAL HERNIA REPAIR     bilat as a teen   SINUS ENDO W/FUSION     20s   WISDOM TOOTH EXTRACTION     Medication List:  Current Outpatient Medications  Medication Sig Dispense Refill   dexlansoprazole  (DEXILANT ) 60 MG capsule Take 1 capsule (60 mg total) by mouth daily. 90 capsule 1   BLACK CURRANT SEED OIL PO Take by  mouth.     No current facility-administered medications for this visit.   Allergies: Allergies  Allergen Reactions   Amoxicillin  Nausea And Vomiting   Cymbalta  [Duloxetine  Hcl] Nausea Only   Biaxin [Clarithromycin] Hives   Ceftin [Cefuroxime Axetil] Hives   Penicillins Hives    Has patient had a PCN reaction causing immediate rash, facial/tongue/throat swelling, SOB or lightheadedness with hypotension: Yes Has patient had a PCN reaction causing severe rash involving mucus membranes or skin necrosis: Yes Has patient had a PCN reaction that required hospitalization: No Has patient had a PCN reaction occurring within the last 10 years: No If all of the above answers are NO, then may proceed with Cephalosporin use.    Tetracyclines & Related Hives   Social History: Social History   Socioeconomic History   Marital status: Married    Spouse name: Valery   Number of children: 1   Years of education: [employer]   Highest education level: Not on file  Occupational History   Not on file  Tobacco Use   Smoking status: Former    Current packs/day: 0.00    Average packs/day: 0.5 packs/day for 15.0 years (7.5 ttl pk-yrs)    Types: Cigarettes    Start date: 2008    Quit date: 04/2021    Years since quitting: 2.6    Passive exposure: Never   Smokeless tobacco: Never   Tobacco comments:    Still vapes occasionally  Vaping Use   Vaping status: Former   Substances: Nicotine , Flavoring  Substance and Sexual Activity   Alcohol use: Not Currently    Alcohol/week: 0.0 standard drinks of alcohol   Drug use: No   Sexual activity: Yes    Partners: Female    Birth control/protection: None  Other Topics Concern   Not on file  Social History Narrative   Patient lives at home with his wife Sherolyn).   Educ: HS   Occup: truck driver   +tob   Hx alc abuse   Social Drivers of Corporate investment banker Strain: Low Risk  (08/28/2023)   Overall Financial Resource Strain (CARDIA)     Difficulty of Paying Living Expenses: Not hard at all  Food Insecurity: No Food Insecurity (08/28/2023)   Hunger Vital Sign    Worried About Running Out of Food in the Last Year: Never true    Ran Out of Food in the Last Year: Never true  Transportation Needs: No Transportation Needs (08/28/2023)   PRAPARE - Administrator, Civil Service (Medical): No    Lack of Transportation (Non-Medical): No  Physical Activity: Insufficiently Active (08/28/2023)   Exercise Vital Sign    Days of Exercise per Week: 3 days    Minutes of Exercise per Session: 30 min  Stress: Stress Concern Present (08/28/2023)   [employer] of Occupational Health - Occupational Stress Questionnaire    Feeling of Stress : Very much  Social Connections: Moderately Integrated (08/28/2023)  Social Connection and Isolation Panel    Frequency of Communication with Friends and Family: More than three times a week    Frequency of Social Gatherings with Friends and Family: More than three times a week    Attends Religious Services: 1 to 4 times per year    Active Member of [employer] or Organizations: No    Attends Engineer, structural: Not on file    Marital Status: Married   Lives in a house. Smoking: quit in 2022 Occupation: Human resources officer History: Water Damage/mildew in the house: no Engineer, civil (consulting) in the family room: no Carpet in the bedroom: no Heating: electric Cooling: central Pet: yes 2 dogs  Family History: Family History  Problem Relation Age of Onset   Migraines Mother    Arthritis Mother    Asthma Mother    Depression Mother    Hypertension Mother    Esophageal cancer Father 68   Early death Father    Arthritis Maternal Grandmother    Cancer Maternal Grandmother    Lung cancer Paternal Grandfather    Breast cancer Paternal Aunt     Review of Systems  Constitutional:  Negative for appetite change, chills, fever and unexpected weight change.  HENT:  Negative  for congestion and rhinorrhea.   Eyes:  Negative for itching.  Respiratory:  Negative for cough, chest tightness, shortness of breath and wheezing.   Cardiovascular:  Negative for chest pain.  Gastrointestinal:  Positive for abdominal pain and diarrhea. Negative for constipation, nausea and vomiting.  Genitourinary:  Negative for difficulty urinating.  Musculoskeletal:  Positive for arthralgias.  Skin:  Negative for rash.  Neurological:  Negative for headaches.    Objective: BP 110/68   Pulse 62   Temp 97.9 F (36.6 C) (Temporal)   Resp 18   Ht 5' 11 (1.803 m)   Wt 185 lb 12.8 oz (84.3 kg)   SpO2 97%   BMI 25.91 kg/m  Body mass index is 25.91 kg/m. Physical Exam Vitals and nursing note reviewed.  Constitutional:      Appearance: Normal appearance. He is well-developed.  HENT:     Head: Normocephalic and atraumatic.     Right Ear: Tympanic membrane and external ear normal.     Left Ear: Tympanic membrane and external ear normal.     Nose: Nose normal.     Mouth/Throat:     Mouth: Mucous membranes are moist.     Pharynx: Oropharynx is clear.  Eyes:     Conjunctiva/sclera: Conjunctivae normal.  Cardiovascular:     Rate and Rhythm: Normal rate and regular rhythm.     Heart sounds: Normal heart sounds. No murmur heard.    No friction rub. No gallop.  Pulmonary:     Effort: Pulmonary effort is normal.     Breath sounds: Normal breath sounds. No wheezing, rhonchi or rales.  Musculoskeletal:     Cervical back: Neck supple.  Skin:    General: Skin is warm.     Findings: No rash.  Neurological:     Mental Status: He is alert and oriented to person, place, and time.  Psychiatric:        Behavior: Behavior normal.    The plan was reviewed with the patient/family, and all questions/concerned were addressed.  It was my pleasure to see Dustin [name] today and participate in his care. Please feel free to contact me with any questions or concerns.  Sincerely,  Orlan Cramp,  DO Allergy  & Immunology  [facility]  [place] office: [phone] [facility] office: [phone]

## 2023-12-20 ENCOUNTER — Ambulatory Visit: Admitting: Podiatry

## 2024-02-17 ENCOUNTER — Encounter: Payer: Self-pay | Admitting: Family Medicine

## 2024-02-17 ENCOUNTER — Ambulatory Visit (INDEPENDENT_AMBULATORY_CARE_PROVIDER_SITE_OTHER): Admitting: Family Medicine

## 2024-02-17 VITALS — BP 110/65 | HR 64 | Temp 97.9°F | Ht 71.0 in | Wt 186.4 lb

## 2024-02-17 DIAGNOSIS — R7689 Other specified abnormal immunological findings in serum: Secondary | ICD-10-CM

## 2024-02-17 DIAGNOSIS — R42 Dizziness and giddiness: Secondary | ICD-10-CM | POA: Diagnosis not present

## 2024-02-17 DIAGNOSIS — M2550 Pain in unspecified joint: Secondary | ICD-10-CM | POA: Diagnosis not present

## 2024-02-17 DIAGNOSIS — M7918 Myalgia, other site: Secondary | ICD-10-CM | POA: Diagnosis not present

## 2024-02-17 DIAGNOSIS — R519 Headache, unspecified: Secondary | ICD-10-CM

## 2024-02-17 DIAGNOSIS — G8929 Other chronic pain: Secondary | ICD-10-CM

## 2024-02-17 LAB — CBC WITH DIFFERENTIAL/PLATELET
Basophils Absolute: 0 K/uL (ref 0.0–0.1)
Basophils Relative: 0.7 % (ref 0.0–3.0)
Eosinophils Absolute: 0.2 K/uL (ref 0.0–0.7)
Eosinophils Relative: 3.3 % (ref 0.0–5.0)
HCT: 44.8 % (ref 39.0–52.0)
Hemoglobin: 15.7 g/dL (ref 13.0–17.0)
Lymphocytes Relative: 38.2 % (ref 12.0–46.0)
Lymphs Abs: 2.4 K/uL (ref 0.7–4.0)
MCHC: 35.1 g/dL (ref 30.0–36.0)
MCV: 89.3 fl (ref 78.0–100.0)
Monocytes Absolute: 0.3 K/uL (ref 0.1–1.0)
Monocytes Relative: 5.2 % (ref 3.0–12.0)
Neutro Abs: 3.2 K/uL (ref 1.4–7.7)
Neutrophils Relative %: 52.6 % (ref 43.0–77.0)
Platelets: 282 K/uL (ref 150.0–400.0)
RBC: 5.02 Mil/uL (ref 4.22–5.81)
RDW: 13.4 % (ref 11.5–15.5)
WBC: 6.2 K/uL (ref 4.0–10.5)

## 2024-02-17 LAB — CK: Total CK: 118 U/L (ref 17–232)

## 2024-02-17 LAB — COMPREHENSIVE METABOLIC PANEL WITH GFR
ALT: 39 U/L (ref 0–53)
AST: 21 U/L (ref 0–37)
Albumin: 5.2 g/dL (ref 3.5–5.2)
Alkaline Phosphatase: 58 U/L (ref 39–117)
BUN: 14 mg/dL (ref 6–23)
CO2: 28 meq/L (ref 19–32)
Calcium: 10 mg/dL (ref 8.4–10.5)
Chloride: 100 meq/L (ref 96–112)
Creatinine, Ser: 1.1 mg/dL (ref 0.40–1.50)
GFR: 86.98 mL/min (ref >60.00)
Glucose, Bld: 91 mg/dL (ref 70–99)
Potassium: 4.2 meq/L (ref 3.5–5.1)
Sodium: 137 meq/L (ref 135–145)
Total Bilirubin: 0.8 mg/dL (ref 0.2–1.2)
Total Protein: 7.7 g/dL (ref 6.0–8.3)

## 2024-02-17 LAB — SEDIMENTATION RATE: Sed Rate: 4 mm/h (ref 0–15)

## 2024-02-17 LAB — TSH: TSH: 1.2 u[IU]/mL (ref 0.35–5.50)

## 2024-02-17 LAB — URIC ACID: Uric Acid, Serum: 7.1 mg/dL (ref 4.0–7.8)

## 2024-02-17 LAB — T4, FREE: Free T4: 0.82 ng/dL (ref 0.60–1.60)

## 2024-02-17 LAB — C-REACTIVE PROTEIN: CRP: <0.5 mg/dL (ref 0.5–20.0)

## 2024-02-17 NOTE — Progress Notes (Signed)
 OFFICE VISIT  02/17/2024  CC:  Chief Complaint  Patient presents with   Headache    Pt also c/o body aches and dizziness; starting    Patient is a 33 y.o. male who presents for joint aches and multiple other symptoms.  HPI: Dustin [name] is very frustrated, feels miserable every day due to a wide range of symptoms that no one has ever been able to figure out. He is interested in an another screening lab panel.  For years he has had problems with vertigo, frequent nausea, bloating, diarrhea, diffuse aching in all the joints, muscle and bone pain, headaches. All symptoms seem to be worse on the left side of his body.  Many medications have been tried and either failed or he did not tolerate them.  He does feel like prednisone  has helped some but not consistently and not to a remarkable agree. And it makes him irritable so he does not want to take it.  Review of systems: No visual abnormalities, no hearing abnormalities, no focal weakness, no rash, no dysuria, no excessive bruising, no blood in stool or urine, no joint swelling. No URI/cough/sore throat.  No chest pain or shortness of breath. No tremor, no tingling or numbness.  No focal weakness.  No swallowing difficulties.  No abnormal weight loss.  Past Medical History:  Diagnosis Date   Alcoholism in recovery [facility])    Allergy     Arthritis    Asthma    Bilateral hand pain    cymbalta  VERY helpful but caused intolerable nausea.  Meloxicam  ok   Bilateral headaches 01/27/2015   neuro->low serotonin level->cymbalta .  Multiple normal MRI brain   Epiploic appendagitis    09/2020   Fibromyalgia    rheum eval 2024   GAD (generalized anxiety disorder)    GERD (gastroesophageal reflux disease)    hx esophagitis   Hearing loss    IBS (irritable bowel syndrome)    Laryngopharyngeal reflux (LPR)    POTS (postural orthostatic tachycardia syndrome) 02/13/2016   florinef  no help.  Propranolol  helpful   Restless legs syndrome    Seasonal  allergies    Vertigo    2018, central-->MRI and MRA brain normal    Past Surgical History:  Procedure Laterality Date   CARDIOVASCULAR STRESS TEST     12/2015 myoview  normal   COLONOSCOPY     08/11/23 NORMAL   ESOPHAGOGASTRODUODENOSCOPY     02/06/2020: Esophagitis, erosive gastro duodenopathy.  08/11/23 GER-->changed from bid omep to bid dexilant .  Duodenal bx normal.   INGUINAL HERNIA REPAIR     bilat as a teen   SINUS ENDO W/FUSION     20s   WISDOM TOOTH EXTRACTION      Outpatient Medications Prior to Visit  Medication Sig Dispense Refill   BLACK CURRANT SEED OIL PO Take by mouth.     dexlansoprazole  (DEXILANT ) 60 MG capsule Take 1 capsule (60 mg total) by mouth daily. 90 capsule 1   No facility-administered medications prior to visit.    Allergies  Allergen Reactions   Amoxicillin  Nausea And Vomiting   Cymbalta  [Duloxetine  Hcl] Nausea Only   Biaxin [Clarithromycin] Hives   Ceftin [Cefuroxime Axetil] Hives   Penicillins Hives    Has patient had a PCN reaction causing immediate rash, facial/tongue/throat swelling, SOB or lightheadedness with hypotension: Yes Has patient had a PCN reaction causing severe rash involving mucus membranes or skin necrosis: Yes Has patient had a PCN reaction that required hospitalization: No Has patient had a PCN reaction  occurring within the last 10 years: No If all of the above answers are NO, then may proceed with Cephalosporin use.    Tetracyclines & Related Hives    Review of Systems  As per HPI  PE:    02/17/2024    9:51 AM 12/15/2023    3:45 PM 12/07/2023    3:44 PM  Vitals with BMI  Height 5' 11 5' 11 5' 11  Weight 186 lbs 6 oz 185 lbs 13 oz 187 lbs 3 oz  BMI 26.01 25.93 26.12  Systolic 110 110 894  Diastolic 65 68 63  Pulse 64 62 70     Physical Exam  Gen: Alert, well appearing.  Patient is oriented to person, place, time, and situation. AFFECT: pleasant, lucid thought and speech. He has mild tenderness to palpation  of soft tissues from the neck down into the feet.  Most prominent tenderness to palpation in the hands, less so in the elbows, shoulders, knees, and ankles and feet. No joint swelling or erythema. Neuro: CN 2-12 intact bilaterally, no nystagmus, strength 5/5 in proximal and distal upper extremities and lower extremities bilaterally.  No tremor.   No ataxia.  Upper extremity and lower extremity DTRs symmetric.  No pronator drift.  LABS:  Last CBC Lab Results  Component Value Date   WBC 8.2 04/30/2022   HGB 14.6 04/30/2022   HCT 41.1 04/30/2022   MCV 90.3 04/30/2022   MCH 32.1 04/30/2022   RDW 13.1 04/30/2022   PLT 262 04/30/2022   Last metabolic panel Lab Results  Component Value Date   GLUCOSE 95 05/04/2023   NA 139 05/04/2023   K 3.5 05/04/2023   CL 101 05/04/2023   CO2 26 05/04/2023   BUN 17 05/04/2023   CREATININE 0.97 05/04/2023   GFR 101.71 05/04/2023   CALCIUM 9.9 05/04/2023   PROT 7.3 05/04/2023   ALBUMIN 5.0 05/04/2023   BILITOT 0.6 05/04/2023   ALKPHOS 64 05/04/2023   AST 15 05/04/2023   ALT 28 05/04/2023   ANIONGAP 11 11/03/2020   Last thyroid  functions Lab Results  Component Value Date   TSH 1.17 04/30/2022   Lab Results  Component Value Date   CKTOTAL 100 05/07/2022   Lab Results  Component Value Date   ESRSEDRATE 5 05/04/2023   Lab Results  Component Value Date   CRP <1.0 05/04/2023   Lab Results  Component Value Date   ANA POSITIVE (A) 05/04/2023  1: 40  Lab Results  Component Value Date   LABURIC 7.5 04/30/2022   IMPRESSION AND PLAN:  #1 chronic vertigo, headaches, and widespread musculoskeletal pain. Extensive blood workup, brain imaging, and skeletal imaging unrevealing.  He has had a positive ANA, 1:40, suspected to be false positive/not clinically significant. Rheumatologist evaluation did not reveal any cause of symptoms. Fibromyalgia suspected. Will do blood work again. No imaging today. No new medications today.  An After  Visit Summary was printed and given to the patient.  FOLLOW UP: Return in about 3 months (around 05/19/2024) for routine chronic illness f/u.  Signed:  Gerlene Hockey, MD           02/17/2024

## 2024-02-20 ENCOUNTER — Ambulatory Visit: Payer: Self-pay | Admitting: Family Medicine

## 2024-02-22 LAB — ANA SCREEN,IFA,REFLEX TITER/PATTERN,REFLEX MPLX 11 AB CASCADE
Anti Nuclear Antibody (ANA): POSITIVE — AB
Cyclic Citrullin Peptide Ab: <16 U
MUTATED CITRULLINATED VIMENTIN (MCV) AB: <20 U/mL (ref <20)
Rheumatoid fact SerPl-aCnc: <10 [IU]/mL (ref <14)

## 2024-02-22 LAB — TIER 1
Chromatin (Nucleosomal) Antibody: <1 AI
ENA SM Ab Ser-aCnc: <1 AI
Ribonucleic Protein(ENA) Antibody, IgG: <1 AI
SM/RNP: <1 AI
ds DNA Ab: <1 [IU]/mL

## 2024-02-22 LAB — ANTI-NUCLEAR AB-TITER (ANA TITER): ANA Titer 1: 1:40 {titer} — ABNORMAL HIGH

## 2024-02-22 LAB — TIER 2
Jo-1 Autoabs: <1 AI
SSA (Ro) (ENA) Antibody, IgG: <1 AI
SSB (La) (ENA) Antibody, IgG: <1 AI
Scleroderma (Scl-70) (ENA) Antibody, IgG: <1 AI

## 2024-02-22 LAB — INTERPRETATION

## 2024-02-22 LAB — HLA-B27 ANTIGEN: HLA-B27 Antigen: NEGATIVE

## 2024-02-22 LAB — TIER 3
Centromere Ab Screen: <1 AI
Ribosomal P Protein Ab: <1 AI

## 2024-02-22 LAB — T3: T3, Total: 115 ng/dL (ref 76–181)

## 2024-04-19 ENCOUNTER — Encounter: Payer: Self-pay | Admitting: Family Medicine

## 2024-04-19 ENCOUNTER — Ambulatory Visit (INDEPENDENT_AMBULATORY_CARE_PROVIDER_SITE_OTHER): Admitting: Family Medicine

## 2024-04-19 VITALS — BP 117/72 | HR 69 | Temp 98.2°F | Ht 71.0 in | Wt 191.4 lb

## 2024-04-19 DIAGNOSIS — F4323 Adjustment disorder with mixed anxiety and depressed mood: Secondary | ICD-10-CM | POA: Diagnosis not present

## 2024-04-19 DIAGNOSIS — R0789 Other chest pain: Secondary | ICD-10-CM | POA: Diagnosis not present

## 2024-04-19 NOTE — Progress Notes (Signed)
 OFFICE VISIT  04/19/2024  CC:  Chief Complaint  Patient presents with   Muscle Pain    Left side starting a few days ago; pt has been working out and may have a bone spur; tender to touch   Patient is a 34 y.o. male who presents for left chest wall pain.  HPI: About 2 days ago he began to notice a focal area of tenderness and slight protrusion in the left upper chest wall.  He has been working out with a psychologist, educational.  The area is tender to touch. No shortness of breath.  No cough.  No overlying skin changes.  Past Medical History:  Diagnosis Date   Alcoholism in recovery [facility])    Allergy     Arthritis    Asthma    Bilateral hand pain    cymbalta  VERY helpful but caused intolerable nausea.  Meloxicam  ok   Bilateral headaches 01/27/2015   neuro->low serotonin level->cymbalta .  Multiple normal MRI brain   Epiploic appendagitis    09/2020   Fibromyalgia    rheum eval 2024   GAD (generalized anxiety disorder)    GERD (gastroesophageal reflux disease)    hx esophagitis   Hearing loss    IBS (irritable bowel syndrome)    Laryngopharyngeal reflux (LPR)    POTS (postural orthostatic tachycardia syndrome) 02/13/2016   florinef  no help.  Propranolol  helpful   Restless legs syndrome    Seasonal allergies    Vertigo    2018, central-->MRI and MRA brain normal    Past Surgical History:  Procedure Laterality Date   CARDIOVASCULAR STRESS TEST     12/2015 myoview  normal   COLONOSCOPY     08/11/23 NORMAL   ESOPHAGOGASTRODUODENOSCOPY     02/06/2020: Esophagitis, erosive gastro duodenopathy.  08/11/23 GER-->changed from bid omep to bid dexilant .  Duodenal bx normal.   INGUINAL HERNIA REPAIR     bilat as a teen   SINUS ENDO W/FUSION     20s   WISDOM TOOTH EXTRACTION      Outpatient Medications Prior to Visit  Medication Sig Dispense Refill   BLACK CURRANT SEED OIL PO Take by mouth.     dexlansoprazole  (DEXILANT ) 60 MG capsule Take 1 capsule (60 mg total) by mouth daily. 90 capsule 1    No facility-administered medications prior to visit.    Allergies[1]  Review of Systems  As per HPI  PE:    04/19/2024   11:20 AM 02/17/2024    9:51 AM 12/15/2023    3:45 PM  Vitals with BMI  Height 5' 11 5' 11 5' 11  Weight 191 lbs 6 oz 186 lbs 6 oz 185 lbs 13 oz  BMI 26.71 26.01 25.93  Systolic 117 110 889  Diastolic 72 65 68  Pulse 69 64 62     Physical Exam  General: Alert and well-appearing. Affect is pleasant, thought and speech are lucid. Focal tenderness to palpation over second rib just about 2 inches lateral to the sternum. I do not see any protrusion or obvious chest asymmetry.  Skin is normal. CV: RRR, no murmur Chest is clear, no wheezing or rales. Normal symmetric air entry throughout both lung fields. No chest wall deformities or tenderness.   LABS:  Last CBC Lab Results  Component Value Date   WBC 6.2 02/17/2024   HGB 15.7 02/17/2024   HCT 44.8 02/17/2024   MCV 89.3 02/17/2024   MCH 32.1 04/30/2022   RDW 13.4 02/17/2024   PLT 282.0 02/17/2024  Last metabolic panel Lab Results  Component Value Date   GLUCOSE 91 02/17/2024   NA 137 02/17/2024   K 4.2 02/17/2024   CL 100 02/17/2024   CO2 28 02/17/2024   BUN 14 02/17/2024   CREATININE 1.10 02/17/2024   GFR 86.98 02/17/2024   CALCIUM 10.0 02/17/2024   PROT 7.7 02/17/2024   ALBUMIN 5.2 02/17/2024   BILITOT 0.8 02/17/2024   ALKPHOS 58 02/17/2024   AST 21 02/17/2024   ALT 39 02/17/2024   ANIONGAP 11 11/03/2020   IMPRESSION AND PLAN:  #1 chest wall pain. Focal muscle versus rib pain.  Reassured.  Monitor. Alternate heat and ice, do stretching, avoid upper body workouts for a couple of days.  #2 adjustment disorder with mixed anxious and depressed mood. Lots of excessive life stress in the last 6 months or so, his mom died about 6 months ago as well.  He requests referral to a psychologist. Ordered referral today to Dr. Caron Fire.  An After Visit Summary was printed and given  to the patient.  FOLLOW UP: Return if symptoms worsen or fail to improve.  Signed:  Gerlene Hockey, MD           04/19/2024       [1]  Allergies Allergen Reactions   Amoxicillin  Nausea And Vomiting   Cymbalta  [Duloxetine  Hcl] Nausea Only   Biaxin [Clarithromycin] Hives   Ceftin [Cefuroxime Axetil] Hives   Penicillins Hives    Has patient had a PCN reaction causing immediate rash, facial/tongue/throat swelling, SOB or lightheadedness with hypotension: Yes Has patient had a PCN reaction causing severe rash involving mucus membranes or skin necrosis: Yes Has patient had a PCN reaction that required hospitalization: No Has patient had a PCN reaction occurring within the last 10 years: No If all of the above answers are NO, then may proceed with Cephalosporin use.    Tetracyclines & Related Hives

## 2024-04-27 ENCOUNTER — Ambulatory Visit: Admitting: Family Medicine

## 2024-04-30 ENCOUNTER — Ambulatory Visit: Admitting: Neurology

## 2024-05-07 NOTE — Progress Notes (Unsigned)
" ° ° ° ° ° ° ° ° ° ° ° ° ° ° °  Dustin SHAUNNA Fire, PsyD "

## 2024-05-14 ENCOUNTER — Ambulatory Visit: Admitting: Family Medicine

## 2024-05-15 ENCOUNTER — Ambulatory Visit: Payer: Self-pay | Admitting: Psychology

## 2024-05-21 ENCOUNTER — Ambulatory Visit: Admitting: Family Medicine

## 2024-06-07 ENCOUNTER — Ambulatory Visit: Admitting: Psychology
